# Patient Record
Sex: Male | Born: 1940
Health system: Southern US, Community
[De-identification: ages and names within clinical notes are randomized; demographics above are authoritative.]

## PROBLEM LIST (undated history)

## (undated) DIAGNOSIS — E1165 Type 2 diabetes mellitus with hyperglycemia: Secondary | ICD-10-CM

## (undated) DIAGNOSIS — H269 Unspecified cataract: Secondary | ICD-10-CM

## (undated) DIAGNOSIS — G5601 Carpal tunnel syndrome, right upper limb: Secondary | ICD-10-CM

## (undated) DIAGNOSIS — M0600 Rheumatoid arthritis without rheumatoid factor, unspecified site: Secondary | ICD-10-CM

## (undated) DIAGNOSIS — Z86010 Personal history of colonic polyps: Secondary | ICD-10-CM

## (undated) DIAGNOSIS — E785 Hyperlipidemia, unspecified: Secondary | ICD-10-CM

## (undated) DIAGNOSIS — N289 Disorder of kidney and ureter, unspecified: Secondary | ICD-10-CM

## (undated) DIAGNOSIS — E079 Disorder of thyroid, unspecified: Secondary | ICD-10-CM

## (undated) DIAGNOSIS — N529 Male erectile dysfunction, unspecified: Secondary | ICD-10-CM

## (undated) DIAGNOSIS — IMO0001 Reserved for inherently not codable concepts without codable children: Secondary | ICD-10-CM

## (undated) DIAGNOSIS — E1140 Type 2 diabetes mellitus with diabetic neuropathy, unspecified: Secondary | ICD-10-CM

## (undated) DIAGNOSIS — C439 Malignant melanoma of skin, unspecified: Secondary | ICD-10-CM

## (undated) DIAGNOSIS — K219 Gastro-esophageal reflux disease without esophagitis: Secondary | ICD-10-CM

## (undated) DIAGNOSIS — Z87442 Personal history of urinary calculi: Secondary | ICD-10-CM

## (undated) HISTORY — DX: Gastro-esophageal reflux disease without esophagitis: K21.9

## (undated) HISTORY — DX: Carpal tunnel syndrome, right upper limb: G56.01

## (undated) HISTORY — DX: Reserved for inherently not codable concepts without codable children: IMO0001

## (undated) HISTORY — DX: Male erectile dysfunction, unspecified: N52.9

## (undated) HISTORY — DX: Disorder of thyroid, unspecified: E07.9

## (undated) HISTORY — PX: CHOLECYSTECTOMY: SHX55

## (undated) HISTORY — DX: Hyperlipidemia, unspecified: E78.5

## (undated) HISTORY — PX: MELANOMA EXCISION: SHX5266

## (undated) HISTORY — DX: Type 2 diabetes mellitus with diabetic neuropathy, unspecified: E11.40

## (undated) HISTORY — DX: Type 2 diabetes mellitus with hyperglycemia: E11.65

## (undated) HISTORY — DX: Malignant melanoma of skin, unspecified: C43.9

## (undated) HISTORY — PX: PILONIDAL CYST / SINUS EXCISION: SUR543

## (undated) HISTORY — DX: Unspecified cataract: H26.9

## (undated) HISTORY — DX: Rheumatoid arthritis without rheumatoid factor, unspecified site: M06.00

## (undated) HISTORY — PX: ROTATOR CUFF REPAIR: SHX139

## (undated) HISTORY — PX: TOE SURGERY: SHX1073

## (undated) HISTORY — PX: CATARACT EXTRACTION W/ INTRAOCULAR LENS IMPLANT: SHX1309

## (undated) HISTORY — PX: KNEE ARTHROSCOPY: SUR90

## (undated) HISTORY — DX: Personal history of urinary calculi: Z87.442

## (undated) HISTORY — DX: Personal history of colonic polyps: Z86.010

---

## 1997-08-22 ENCOUNTER — Ambulatory Visit (HOSPITAL_COMMUNITY): Admission: RE | Admit: 1997-08-22 | Discharge: 1997-08-22 | Payer: Self-pay | Admitting: *Deleted

## 2001-04-04 ENCOUNTER — Ambulatory Visit (HOSPITAL_BASED_OUTPATIENT_CLINIC_OR_DEPARTMENT_OTHER): Admission: RE | Admit: 2001-04-04 | Discharge: 2001-04-04 | Payer: Self-pay | Admitting: *Deleted

## 2001-04-18 ENCOUNTER — Ambulatory Visit (HOSPITAL_BASED_OUTPATIENT_CLINIC_OR_DEPARTMENT_OTHER): Admission: RE | Admit: 2001-04-18 | Discharge: 2001-04-18 | Payer: Self-pay | Admitting: *Deleted

## 2003-04-07 ENCOUNTER — Encounter: Admission: RE | Admit: 2003-04-07 | Discharge: 2003-07-06 | Payer: Self-pay | Admitting: Family Medicine

## 2003-07-22 ENCOUNTER — Ambulatory Visit (HOSPITAL_COMMUNITY): Admission: RE | Admit: 2003-07-22 | Discharge: 2003-07-22 | Payer: Self-pay | Admitting: General Surgery

## 2003-07-24 ENCOUNTER — Encounter: Admission: RE | Admit: 2003-07-24 | Discharge: 2003-10-22 | Payer: Self-pay | Admitting: Family Medicine

## 2003-07-31 ENCOUNTER — Encounter (INDEPENDENT_AMBULATORY_CARE_PROVIDER_SITE_OTHER): Payer: Self-pay | Admitting: Specialist

## 2003-07-31 ENCOUNTER — Ambulatory Visit (HOSPITAL_BASED_OUTPATIENT_CLINIC_OR_DEPARTMENT_OTHER): Admission: RE | Admit: 2003-07-31 | Discharge: 2003-07-31 | Payer: Self-pay | Admitting: General Surgery

## 2003-07-31 ENCOUNTER — Ambulatory Visit (HOSPITAL_COMMUNITY): Admission: RE | Admit: 2003-07-31 | Discharge: 2003-07-31 | Payer: Self-pay | Admitting: General Surgery

## 2004-06-24 ENCOUNTER — Encounter: Admission: RE | Admit: 2004-06-24 | Discharge: 2004-06-24 | Payer: Self-pay | Admitting: Family Medicine

## 2006-02-28 ENCOUNTER — Ambulatory Visit: Payer: Self-pay | Admitting: Internal Medicine

## 2006-02-28 LAB — CONVERTED CEMR LAB
Albumin: 3.9 g/dL (ref 3.5–5.2)
Calcium: 10 mg/dL (ref 8.4–10.5)
Creatinine,U: 143.2 mg/dL
GFR calc Af Amer: 96 mL/min
Microalb, Ur: 1.7 mg/dL (ref 0.0–1.9)
Phosphorus: 3.3 mg/dL (ref 2.3–4.6)
Potassium: 4.4 meq/L (ref 3.5–5.1)
TSH: 1.68 microintl units/mL (ref 0.35–5.50)

## 2006-08-22 DIAGNOSIS — E039 Hypothyroidism, unspecified: Secondary | ICD-10-CM | POA: Insufficient documentation

## 2006-08-22 DIAGNOSIS — K2100 Gastro-esophageal reflux disease with esophagitis, without bleeding: Secondary | ICD-10-CM | POA: Insufficient documentation

## 2006-08-22 DIAGNOSIS — F528 Other sexual dysfunction not due to a substance or known physiological condition: Secondary | ICD-10-CM | POA: Insufficient documentation

## 2006-08-22 DIAGNOSIS — E785 Hyperlipidemia, unspecified: Secondary | ICD-10-CM | POA: Insufficient documentation

## 2006-08-22 DIAGNOSIS — Z85820 Personal history of malignant melanoma of skin: Secondary | ICD-10-CM | POA: Insufficient documentation

## 2006-08-26 DIAGNOSIS — E1149 Type 2 diabetes mellitus with other diabetic neurological complication: Secondary | ICD-10-CM | POA: Insufficient documentation

## 2006-08-26 DIAGNOSIS — E1165 Type 2 diabetes mellitus with hyperglycemia: Secondary | ICD-10-CM

## 2006-08-29 ENCOUNTER — Ambulatory Visit: Payer: Self-pay | Admitting: Internal Medicine

## 2006-09-07 ENCOUNTER — Ambulatory Visit: Payer: Self-pay

## 2006-09-07 ENCOUNTER — Encounter: Payer: Self-pay | Admitting: Internal Medicine

## 2006-12-14 ENCOUNTER — Ambulatory Visit: Payer: Self-pay | Admitting: Internal Medicine

## 2006-12-14 LAB — CONVERTED CEMR LAB
Blood in Urine, dipstick: NEGATIVE
Glucose, Urine, Semiquant: >=1000
Protein, U semiquant: 30
WBC Urine, dipstick: NEGATIVE

## 2007-01-24 ENCOUNTER — Emergency Department (HOSPITAL_COMMUNITY): Admission: EM | Admit: 2007-01-24 | Discharge: 2007-01-24 | Payer: Self-pay | Admitting: Emergency Medicine

## 2007-02-28 ENCOUNTER — Ambulatory Visit: Payer: Self-pay | Admitting: Internal Medicine

## 2007-02-28 DIAGNOSIS — Z87442 Personal history of urinary calculi: Secondary | ICD-10-CM | POA: Insufficient documentation

## 2007-03-01 LAB — CONVERTED CEMR LAB
Alkaline Phosphatase: 65 units/L (ref 39–117)
BUN: 13 mg/dL (ref 6–23)
Basophils Relative: 0.1 % (ref 0.0–1.0)
Bilirubin, Direct: 0.1 mg/dL (ref 0.0–0.3)
Calcium: 9.4 mg/dL (ref 8.4–10.5)
Chloride: 108 meq/L (ref 96–112)
Cholesterol: 142 mg/dL (ref 0–200)
Creatinine,U: 219.3 mg/dL
Eosinophils Absolute: 0.3 10*3/uL (ref 0.0–0.6)
Eosinophils Relative: 3.4 % (ref 0.0–5.0)
GFR calc Af Amer: 86 mL/min
GFR calc non Af Amer: 71 mL/min
Glucose, Bld: 166 mg/dL — ABNORMAL HIGH (ref 70–99)
HCT: 44.9 % (ref 39.0–52.0)
HDL: 32.8 mg/dL — ABNORMAL LOW (ref >39.0)
LDL Cholesterol: 87 mg/dL (ref 0–99)
Lymphocytes Relative: 20 % (ref 12.0–46.0)
MCHC: 33.2 g/dL (ref 30.0–36.0)
MCV: 92.2 fL (ref 78.0–100.0)
Microalb Creat Ratio: 7.3 mg/g (ref 0.0–30.0)
Microalb, Ur: 1.6 mg/dL (ref 0.0–1.9)
Monocytes Absolute: 0.5 10*3/uL (ref 0.2–0.7)
Neutro Abs: 5.8 10*3/uL (ref 1.4–7.7)
Neutrophils Relative %: 70.9 % (ref 43.0–77.0)
Potassium: 5 meq/L (ref 3.5–5.1)
Total Bilirubin: 0.9 mg/dL (ref 0.3–1.2)
Total Protein: 6.8 g/dL (ref 6.0–8.3)
WBC: 8.2 10*3/uL (ref 4.5–10.5)

## 2007-09-10 ENCOUNTER — Ambulatory Visit: Payer: Self-pay | Admitting: Internal Medicine

## 2007-09-11 LAB — CONVERTED CEMR LAB
ALT: 12 units/L (ref 0–53)
AST: 16 units/L (ref 0–37)
Albumin: 4.2 g/dL (ref 3.5–5.2)
Alkaline Phosphatase: 57 units/L (ref 39–117)
BUN: 17 mg/dL (ref 6–23)
Bilirubin, Direct: 0.1 mg/dL (ref 0.0–0.3)
Calcium: 9.9 mg/dL (ref 8.4–10.5)
Chloride: 109 meq/L (ref 96–112)
Cholesterol: 137 mg/dL (ref 0–200)
Creatinine, Ser: 1 mg/dL (ref 0.4–1.5)
GFR calc Af Amer: 95 mL/min
LDL Cholesterol: 86 mg/dL (ref 0–99)
PSA: 0.76 ng/mL (ref 0.10–4.00)
Phosphorus: 3.7 mg/dL (ref 2.3–4.6)
Total Protein: 7.1 g/dL (ref 6.0–8.3)
Triglycerides: 108 mg/dL (ref 0–149)

## 2007-10-25 ENCOUNTER — Encounter: Payer: Self-pay | Admitting: Internal Medicine

## 2008-01-17 ENCOUNTER — Telehealth: Payer: Self-pay | Admitting: Internal Medicine

## 2008-03-05 ENCOUNTER — Ambulatory Visit: Payer: Self-pay | Admitting: Internal Medicine

## 2008-03-11 ENCOUNTER — Ambulatory Visit: Payer: Self-pay | Admitting: Internal Medicine

## 2008-03-11 LAB — CONVERTED CEMR LAB: Fecal Occult Bld: NEGATIVE

## 2008-04-28 ENCOUNTER — Encounter: Payer: Self-pay | Admitting: Internal Medicine

## 2008-08-20 ENCOUNTER — Encounter: Payer: Self-pay | Admitting: Internal Medicine

## 2008-08-26 ENCOUNTER — Emergency Department (HOSPITAL_COMMUNITY): Admission: EM | Admit: 2008-08-26 | Discharge: 2008-08-26 | Payer: Self-pay | Admitting: Emergency Medicine

## 2008-08-26 ENCOUNTER — Ambulatory Visit: Payer: Self-pay | Admitting: Family Medicine

## 2008-09-03 ENCOUNTER — Ambulatory Visit: Payer: Self-pay | Admitting: Internal Medicine

## 2008-09-04 LAB — CONVERTED CEMR LAB
AST: 17 units/L (ref 0–37)
Alkaline Phosphatase: 68 units/L (ref 39–117)
BUN: 14 mg/dL (ref 6–23)
Basophils Relative: 0.1 % (ref 0.0–3.0)
Bilirubin, Direct: 0 mg/dL (ref 0.0–0.3)
CO2: 30 meq/L (ref 19–32)
Chloride: 107 meq/L (ref 96–112)
Cholesterol: 145 mg/dL (ref 0–200)
Creatinine, Ser: 1 mg/dL (ref 0.4–1.5)
Eosinophils Relative: 4 % (ref 0.0–5.0)
Free T4: 0.9 ng/dL (ref 0.6–1.6)
HDL: 33.8 mg/dL — ABNORMAL LOW (ref >39.00)
Hgb A1c MFr Bld: 8.8 % — ABNORMAL HIGH (ref 4.6–6.5)
Lymphs Abs: 1.4 10*3/uL (ref 0.7–4.0)
MCV: 91.9 fL (ref 78.0–100.0)
Monocytes Absolute: 0.4 10*3/uL (ref 0.1–1.0)
Monocytes Relative: 5.2 % (ref 3.0–12.0)
Neutro Abs: 5.3 10*3/uL (ref 1.4–7.7)
Platelets: 191 10*3/uL (ref 150.0–400.0)
TSH: 4.07 microintl units/mL (ref 0.35–5.50)
Total CHOL/HDL Ratio: 4
Total Protein: 7 g/dL (ref 6.0–8.3)
Triglycerides: 77 mg/dL (ref 0.0–149.0)

## 2008-10-16 ENCOUNTER — Telehealth: Payer: Self-pay | Admitting: Internal Medicine

## 2008-12-03 ENCOUNTER — Telehealth: Payer: Self-pay | Admitting: Internal Medicine

## 2008-12-08 ENCOUNTER — Ambulatory Visit: Payer: Self-pay | Admitting: Internal Medicine

## 2008-12-08 DIAGNOSIS — R0609 Other forms of dyspnea: Secondary | ICD-10-CM | POA: Insufficient documentation

## 2008-12-08 DIAGNOSIS — R0989 Other specified symptoms and signs involving the circulatory and respiratory systems: Secondary | ICD-10-CM

## 2008-12-19 ENCOUNTER — Telehealth: Payer: Self-pay | Admitting: Family Medicine

## 2009-02-02 ENCOUNTER — Ambulatory Visit: Payer: Self-pay | Admitting: Internal Medicine

## 2009-02-02 DIAGNOSIS — R079 Chest pain, unspecified: Secondary | ICD-10-CM | POA: Insufficient documentation

## 2009-02-06 ENCOUNTER — Encounter: Payer: Self-pay | Admitting: Internal Medicine

## 2009-02-06 ENCOUNTER — Ambulatory Visit: Payer: Self-pay | Admitting: Internal Medicine

## 2009-02-06 ENCOUNTER — Emergency Department (HOSPITAL_COMMUNITY): Admission: EM | Admit: 2009-02-06 | Discharge: 2009-02-07 | Payer: Self-pay | Admitting: Emergency Medicine

## 2009-02-09 ENCOUNTER — Telehealth: Payer: Self-pay | Admitting: Internal Medicine

## 2009-02-10 ENCOUNTER — Telehealth: Payer: Self-pay | Admitting: Internal Medicine

## 2009-02-12 ENCOUNTER — Encounter: Payer: Self-pay | Admitting: Internal Medicine

## 2009-03-11 ENCOUNTER — Encounter: Payer: Self-pay | Admitting: Internal Medicine

## 2009-03-27 ENCOUNTER — Ambulatory Visit: Payer: Self-pay | Admitting: Internal Medicine

## 2009-03-30 LAB — CONVERTED CEMR LAB: Hgb A1c MFr Bld: 8.5 % — ABNORMAL HIGH (ref 4.6–6.1)

## 2009-04-03 ENCOUNTER — Ambulatory Visit: Payer: Self-pay | Admitting: Internal Medicine

## 2009-04-06 ENCOUNTER — Encounter: Payer: Self-pay | Admitting: Internal Medicine

## 2009-04-21 ENCOUNTER — Encounter: Payer: Self-pay | Admitting: Internal Medicine

## 2009-08-12 ENCOUNTER — Ambulatory Visit: Payer: Self-pay | Admitting: Family Medicine

## 2009-08-21 ENCOUNTER — Telehealth: Payer: Self-pay | Admitting: Internal Medicine

## 2009-09-09 ENCOUNTER — Ambulatory Visit: Payer: Self-pay | Admitting: Internal Medicine

## 2009-09-09 LAB — CONVERTED CEMR LAB
AST: 17 units/L (ref 0–37)
Albumin: 4.2 g/dL (ref 3.5–5.2)
Alkaline Phosphatase: 66 units/L (ref 39–117)
Bilirubin, Direct: 0.2 mg/dL (ref 0.0–0.3)
Calcium: 9.5 mg/dL (ref 8.4–10.5)
Creatinine, Ser: 1 mg/dL (ref 0.4–1.5)
Creatinine,U: 251.8 mg/dL
Eosinophils Relative: 4.5 % (ref 0.0–5.0)
Glucose, Bld: 139 mg/dL — ABNORMAL HIGH (ref 70–99)
HCT: 43.9 % (ref 39.0–52.0)
Lymphs Abs: 1.7 10*3/uL (ref 0.7–4.0)
MCHC: 34.2 g/dL (ref 30.0–36.0)
MCV: 93.7 fL (ref 78.0–100.0)
Monocytes Relative: 5.7 % (ref 3.0–12.0)
Neutro Abs: 6.5 10*3/uL (ref 1.4–7.7)
Phosphorus: 4 mg/dL (ref 2.3–4.6)
RDW: 13.5 % (ref 11.5–14.6)
Total Bilirubin: 0.9 mg/dL (ref 0.3–1.2)

## 2009-09-15 ENCOUNTER — Ambulatory Visit: Payer: Self-pay | Admitting: Internal Medicine

## 2010-01-25 ENCOUNTER — Telehealth: Payer: Self-pay | Admitting: Internal Medicine

## 2010-02-01 ENCOUNTER — Telehealth (INDEPENDENT_AMBULATORY_CARE_PROVIDER_SITE_OTHER): Payer: Self-pay | Admitting: *Deleted

## 2010-02-07 LAB — CONVERTED CEMR LAB
ALT: 146 units/L — ABNORMAL HIGH (ref 0–53)
AST: 103 units/L — ABNORMAL HIGH (ref 0–37)
Albumin: 3.9 g/dL (ref 3.5–5.2)
Albumin: 4.1 g/dL (ref 3.5–5.2)
Alkaline Phosphatase: 166 units/L — ABNORMAL HIGH (ref 39–117)
Alkaline Phosphatase: 60 units/L (ref 39–117)
Basophils Absolute: 0.1 10*3/uL (ref 0.0–0.1)
Basophils Relative: 1.6 % (ref 0.0–3.0)
Bilirubin, Direct: 0.1 mg/dL (ref 0.0–0.3)
CO2: 29 meq/L (ref 19–32)
Calcium: 9.3 mg/dL (ref 8.4–10.5)
Calcium: 9.8 mg/dL (ref 8.4–10.5)
Chloride: 104 meq/L (ref 96–112)
Chloride: 106 meq/L (ref 96–112)
Chloride: 110 meq/L (ref 96–112)
Creatinine, Ser: 0.9 mg/dL (ref 0.4–1.5)
Creatinine, Ser: 1.1 mg/dL (ref 0.4–1.5)
Eosinophils Absolute: 0.2 10*3/uL (ref 0.0–0.7)
Free T4: 0.9 ng/dL (ref 0.6–1.6)
GFR calc Af Amer: 108 mL/min
GFR calc Af Amer: 85 mL/min
GFR calc non Af Amer: 71 mL/min
GFR calc non Af Amer: 89 mL/min
Glucose, Bld: 136 mg/dL — ABNORMAL HIGH (ref 70–99)
HCT: 44 % (ref 39.0–52.0)
HDL: 31.5 mg/dL — ABNORMAL LOW (ref >39.0)
Hemoglobin: 14.6 g/dL (ref 13.0–17.0)
Lymphocytes Relative: 12.5 % (ref 12.0–46.0)
Lymphs Abs: 0.8 10*3/uL (ref 0.7–4.0)
MCV: 92.3 fL (ref 78.0–100.0)
Microalb Creat Ratio: 24.7 mg/g (ref 0.0–30.0)
Monocytes Absolute: 0.4 10*3/uL (ref 0.1–1.0)
Monocytes Relative: 5 % (ref 3.0–12.0)
Monocytes Relative: 5.3 % (ref 3.0–12.0)
Neutro Abs: 4.7 10*3/uL (ref 1.4–7.7)
Neutrophils Relative %: 75 % (ref 43.0–77.0)
PSA: 1.28 ng/mL (ref 0.10–4.00)
Phosphorus: 4.2 mg/dL (ref 2.3–4.6)
Platelets: 197 10*3/uL (ref 150.0–400.0)
Platelets: 210 10*3/uL (ref 150–400)
Potassium: 4.5 meq/L (ref 3.5–5.1)
Potassium: 4.8 meq/L (ref 3.5–5.1)
RBC: 4.83 M/uL (ref 4.22–5.81)
RDW: 12.3 % (ref 11.5–14.6)
RDW: 12.7 % (ref 11.5–14.6)
Sodium: 140 meq/L (ref 135–145)
Sodium: 146 meq/L — ABNORMAL HIGH (ref 135–145)
TSH: 0.19 microintl units/mL — ABNORMAL LOW (ref 0.35–5.50)
Total Bilirubin: 1.2 mg/dL (ref 0.3–1.2)
Total CHOL/HDL Ratio: 5.1
Total Protein: 6.8 g/dL (ref 6.0–8.3)
Total Protein: 7.2 g/dL (ref 6.0–8.3)
Triglycerides: 94 mg/dL (ref 0–149)
VLDL: 19 mg/dL (ref 0–40)
WBC: 6.2 10*3/uL (ref 4.5–10.5)
WBC: 7.8 10*3/uL (ref 4.5–10.5)

## 2010-02-09 NOTE — Letter (Signed)
Summary: Results Follow up Letter  [place] [facility]  [address] [place], [place] [address]   Phone: [phone]  Fax: [phone]    04/06/2009 MRN: [id number]  [facility] [address] [place], [place]  [address]  Dear Mr. Morel,  The following are the results of your recent test(s):  Test         Result    Pap Smear:        Normal _____  Not Normal _____ Comments: ______________________________________________________ Cholesterol: LDL(Bad cholesterol):         Your goal is less than:         HDL (Good cholesterol):       Your goal is more than: Comments:  ______________________________________________________ Mammogram:        Normal _____  Not Normal _____ Comments:  ___________________________________________________________________ Hemoccult:        Normal __X___  Not normal _______ Comments:  stool test is negative for blood, we will repeat this next year.  _____________________________________________________________________ Other Tests:    We routinely do not discuss normal results over the telephone.  If you desire a copy of the results, or you have any questions about this information we can discuss them at your next office visit.   Sincerely,

## 2010-02-09 NOTE — Letter (Signed)
Summary: [facility]  [facility]   Imported By: Lanelle Bal 05/21/2009 13:23:01  _____________________________________________________________________  External Attachment:    Type:   Image     Comment:   External Document  Appended Document: [facility]    Clinical Lists Changes  Observations: Added new observation of DIAB EYE EX: No diabetic retinopathy.    (04/21/2009 7:33)       Diabetic Eye Exam  Procedure date:  04/21/2009  Findings:      No diabetic retinopathy.

## 2010-02-09 NOTE — Assessment & Plan Note (Signed)
Summary: FOLLOW UP / LFW   Vital Signs:  Patient profile:   70 year old male Weight:      182 pounds Temp:     99.0 degrees F oral Pulse rate:   76 / minute Pulse rhythm:   regular BP sitting:   110 / 68  (left arm) Cuff size:   large  Vitals Entered By: Mervin Hack CMA Duncan Dull) (September 15, 2009 9:32 AM) CC: 6 month follow-up   History of Present Illness: Doing well No chest pain since the last visit seemed to be tingliy and "inside" along axillary border  Checks sugars every other week or so Usually  ~130 in the morning was 230 once in the afternoon --he felt nervous NO hypoglycemic reactions No foot pain or lesions  No chest pain No SOB No edema No sig exercise---does yard work and occ rides bike  stomach is quiet  Allergies: 1)  Lipitor (Atorvastatin Calcium)  Past History:  Past medical, surgical, family and social histories (including risk factors) reviewed for relevance to current acute and chronic problems.  Past Medical History: Reviewed history from 02/28/2007 and no changes required. NIDDM with nephropathy Hyperlipidemia Hypothyroidism Melanoma GERD/reflux esophagiits Erectile dysfunction Nephrolithiasis, hx of---passed 1/09  Past Surgical History: Reviewed history from 03/27/2009 and no changes required. Pilonidal cyst 1960 Left knee arthroscopy 2000 Melanoma- right arm 2006 Left rotator cuff repair Left foot (toe) surgery Right carpal tunnel 03/03 Exercise treadmill, negative 09/92 Cholecystectomy  2/11  Family History: Reviewed history from 08/22/2006 and no changes required. Father: Died at 48, leukemia Mother: Died at age 77-- MI, DM Siblings: 2 Brothers, one deceased from drowning (boat accident), one died in his 87's, MI.  One sister died in her 14's ?, bad arthritis CAD in  Mom, brother DM in  Mom No prostate or colon cancer  Social History: Reviewed history from 08/22/2006 and no changes required. 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   Neck:  supple, no masses, no thyromegaly, no carotid bruits, and no cervical lymphadenopathy.   Lungs:  normal respiratory effort, no intercostal retractions, no accessory muscle use, and normal breath sounds.   Heart:  normal rate, regular rhythm, no murmur, and no gallop.   Abdomen:  soft and non-tender.   Msk:  no joint tenderness and no joint swelling.   Pulses:  1+ in feet Extremities:  no edema Skin:  no rashes and no ulcerations.   Psych:  normally interactive, good eye contact, not anxious appearing, and not depressed appearing.    Diabetes Management Exam:    Foot Exam (with socks and/or shoes not present):       Sensory-Pinprick/Light touch:          Left medial foot (L-4): normal          Left dorsal foot (L-5): normal          Left lateral foot (S-1): normal          Right medial foot (L-4): normal          Right dorsal foot (L-5): normal          Right lateral foot (S-1): normal       Inspection:          Left  foot: normal          Right foot: normal       Nails:          Left foot: normal          Right foot: normal   Impression & Recommendations:  Problem # 1:  DIABETES MELLITUS, TYPE II, CONTROLLED, W/RENAL COMPS (ICD-250.40) Assessment Improved has been more careful with sweets and his diet regularly no changes needed now  His updated medication list for this problem includes:    Losartan Potassium 100 Mg Tabs (Losartan potassium) .Marland Kitchen... Take 1 by mouth once daily    Metformin Hcl 500 Mg Tabs (Metformin hcl) .Marland Kitchen... Take 2 by mouth two times a day    Glipizide 5 Mg Tabs (Glipizide) .Marland Kitchen... Take one by mouth two times a day  Labs Reviewed: Creat: 1.0 (09/09/2009)     Last Eye Exam: No diabetic  retinopathy.    (04/21/2009) Reviewed HgBA1c results: 7.6 (09/09/2009)  8.5 (03/27/2009)  Problem # 2:  HYPERLIPIDEMIA (ICD-272.4) Assessment: Unchanged  good control  His updated medication list for this problem includes:    Simvastatin 80 Mg Tabs (Simvastatin) .Marland Kitchen... Take one by mouth once a day  Labs Reviewed: SGOT: 17 (09/09/2009)   SGPT: 11 (09/09/2009)   HDL:34.70 (09/09/2009), 33.80 (09/03/2008)  LDL:96 (09/09/2009), 96 (09/03/2008)  Chol:159 (09/09/2009), 145 (09/03/2008)  Trig:144.0 (09/09/2009), 77.0 (09/03/2008)  Problem # 3:  REFLUX ESOPHAGITIS (ICD-530.11) Assessment: Unchanged doing fine on this  Complete Medication List: 1)  Losartan Potassium 100 Mg Tabs (Losartan potassium) .... Take 1 by mouth once daily 2)  Metformin Hcl 500 Mg Tabs (Metformin hcl) .... Take 2 by mouth two times a day 3)  Glipizide 5 Mg Tabs (Glipizide) .... Take one by mouth two times a day 4)  Simvastatin 80 Mg Tabs (Simvastatin) .... Take one by mouth once a day 5)  Synthroid 75 Mcg Tabs (Levothyroxine sodium) .... Take one by mouth once a day 6)  Nexium 40 Mg Cpdr (Esomeprazole magnesium) .Marland Kitchen.. 1 daily on empty stomach 7)  Onetouch Ultra Test Strp (Glucose blood) .... Use as directed dx: 250.40 patient tests twice a week. 8)  Onetouch Lancets Misc (Lancets) .... Dx: 250.40 use as directed, patient tests twice weekly  Patient Instructions: 1)  Please schedule a follow-up appointment in 6 months .   Current Allergies (reviewed today): LIPITOR (ATORVASTATIN CALCIUM)

## 2010-02-09 NOTE — Consult Note (Signed)
Summary: [facility]   Imported By: Lanelle Bal 03/02/2009 07:49:19  _____________________________________________________________________  External Attachment:    Type:   Image     Comment:   External Document  Appended Document: [facility] planning lap chole

## 2010-02-09 NOTE — Letter (Signed)
Summary: [facility]   Imported By: Maryln Gottron 03/31/2009 13:17:59  _____________________________________________________________________  External Attachment:    Type:   Image     Comment:   External Document  Appended Document: [facility] doing well after lap chole no further testing for filling defect on intraoperative cholangiogram since no ongoing problems and labs normal

## 2010-02-09 NOTE — Progress Notes (Signed)
Summary: Surgery  Phone Note Call from Patient Call back at Home Phone [phone]   Caller: Patient Call For: Cindee Salt MD Summary of Call: pt would like a call from you to see if he can postpone gallbladder surgery,  pt is scheduled to have surgery in the morning, please advise.   **** patient just called at 4:39 Initial call taken by: DeShannon Katrinka Blazing CMA Duncan Dull),  February 10, 2009 4:45 PM  Follow-up for Phone Call        discussed with patient Surgery is tomorrow  Recommended he proceed Elective surgery preferable to recurrence of biliary colic Follow-up by: Cindee Salt MD,  February 11, 2009 8:46 AM

## 2010-02-09 NOTE — Assessment & Plan Note (Signed)
Summary: CHEST PAIN/CLE   Vital Signs:  Patient profile:   70 year old male Weight:      184 pounds Temp:     99.1 degrees F oral Pulse rate:   72 / minute Pulse rhythm:   regular BP sitting:   112 / 60  (left arm) Cuff size:   large  Vitals Entered By: Mervin Hack CMA Duncan Dull) (February 02, 2009 4:48 PM) CC: abdominal pain   History of Present Illness: Has been having more problems with acid reflux Started yesterday at [facility] and steadily worsened over 1.5 hours The past week had episode it lasted 2 hours feels bloated and hard in stomach  tried baking soda and vinegar yesterday---not clear if it helped Both episodes occurred at rest No SOB Does have a burning sensation Felt cold last week with episode Has had some nausea yesterday  Relatively sedentary Walks uphill from work shed--gets him SOB but no real change  No edema  No swallowing problems  Allergies: 1)  Lipitor (Atorvastatin Calcium)  Past History:  Past medical, surgical, family and social histories (including risk factors) reviewed for relevance to current acute and chronic problems.  Past Medical History: Reviewed history from 02/28/2007 and no changes required. NIDDM with nephropathy Hyperlipidemia Hypothyroidism Melanoma GERD/reflux esophagiits Erectile dysfunction Nephrolithiasis, hx of---passed 1/09  Past Surgical History: Reviewed history from 08/22/2006 and no changes required. Pilonidal cyst 1960 Left knee arthroscopy 2000 Melanoma- right arm 2006 Left rotator cuff repair Left foot (toe) surgery Right carpal tunnel 03/03 Exercise treadmill, negative 09/92  Family History: Reviewed history from 08/22/2006 and no changes required. Father: Died at 90, leukemia Mother: Died at age 10-- MI, DM Siblings: 2 Brothers, one deceased from drowning (boat accident), one died in his 58's, MI.  One sister died in her 48's ?, bad arthritis CAD in  Mom, brother DM in  Mom No prostate or colon  cancer  Social History: Reviewed history from 08/22/2006 and no changes required. 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   Neck:  supple, no masses, no thyromegaly, no carotid bruits, and no cervical lymphadenopathy.   Lungs:  normal respiratory effort and normal breath sounds.   Heart:  normal rate, regular rhythm, no murmur, and no gallop.   Abdomen:  soft, non-tender, normal bowel sounds, and no masses.  Murphy's sign absent No HSM Extremities:  no edema Psych:  normally interactive, good eye contact, not anxious appearing, and not depressed appearing.     Impression & Recommendations:  Problem # 1:  CHEST PAIN (ICD-786.50) Assessment New doesn't really sound like coronary ischemia EKG reassuring  could be reflux even other than these events, he has ongoing symptoms has failed OTC prevacid did well on nexium--will give 10 days samples and Rx  sounds like it could be biliary colic white stools and dark urine are suggestive will check labs and RUQ ultrasound  Orders: TLB-Renal Function Panel (80069-RENAL) TLB-CBC Platelet - w/Differential (85025-CBCD) TLB-Hepatic/Liver Function Pnl (80076-HEPATIC) TLB-TSH (Thyroid Stimulating Hormone) (84443-TSH) Venipuncture (16109) EKG w/ Interpretation (93000) Radiology Referral (Radiology)  Complete Medication List:  1)  Metformin Hcl 500 Mg Tabs (Metformin hcl) .... Take 2 by mouth two times a day 2)  Glipizide 5 Mg Tabs (Glipizide) .... Take one by mouth two times a day 3)  Cozaar 100 Mg Tabs (Losartan potassium) ....  Take one by mouth once a day 4)  Simvastatin 80 Mg Tabs (Simvastatin) .... Take one by mouth once a day 5)  Synthroid 75 Mcg Tabs (Levothyroxine sodium) .... Take one by mouth once a day 6)  Onetouch Ultra Test Strp (Glucose blood) .... Use as directed dx: 250.40 patient tests twice a week. 7)  Onetouch Lancets Misc (Lancets) .... Dx: 250.40 use as directed, patient tests twice weekly 8)  Nexium 40 Mg Cpdr (Esomeprazole magnesium) .Marland Kitchen.. 1 daily on empty stomach  Other Orders: TLB-T4 (Thyrox), Free [phone]) TLB-A1C / Hgb A1C (Glycohemoglobin) (83036-A1C)  Patient Instructions: 1)  Keep regular appointment 2)  Set up abdominal ultrasound Prescriptions: NEXIUM 40 MG CPDR (ESOMEPRAZOLE MAGNESIUM) 1 daily on empty stomach  #30 x 11   Entered and Authorized by:   Cindee Salt MD   Signed by:   Cindee Salt MD on 02/02/2009   Method used:   Electronically to        [facility]* (retail)       [address]       [place], [place]  [address]       Ph: [phone]       Fax: [phone]   RxID:   [id number]   Current Allergies (reviewed today): LIPITOR (ATORVASTATIN CALCIUM)   EKG  Procedure date:  02/02/2009  Findings:      sinus arrhythmia @75  Normal variant

## 2010-02-09 NOTE — Progress Notes (Signed)
Summary: Surgeon referral  Phone Note Call from Patient Call back at Home Phone [phone]   Caller: Patient Call For: Cindee Salt MD Summary of Call: Patient states that he was seen in the ER on Friday night.  His gallbladder needs to be removed, and he wants Dr. Alphonsus Sias to recommend a surgeon.  Please advise. Initial call taken by: Linde Gillis CMA Duncan Dull),  February 09, 2009 2:00 PM  Follow-up for Phone Call        I spoke to him already Has appt with [facility] who I would have recommended Follow-up by: Cindee Salt MD,  February 09, 2009 2:24 PM

## 2010-02-09 NOTE — Letter (Signed)
Summary: [facility]: Immunoassay Fecal Occult Blood (iFOB) Order Form  [place] at [facility]  [address] [place], [place] [address]   Phone: [phone]  Fax: [phone]      [facility]: Immunoassay Fecal Occult Blood (iFOB) Order Form   March 27, 2009 MRN: [id number]   Sean Koch Oct 02, 1940   Physicican Name:_______[place]__________________  Diagnosis Code:________V76.51__________________      Cindee Salt MD

## 2010-02-09 NOTE — Assessment & Plan Note (Signed)
Summary: CHEST PAIN   Vital Signs:  Patient profile:   70 year old male Height:      68 inches Weight:      180.6 pounds BMI:     27.56 Temp:     98.9 degrees F oral Pulse rate:   76 / minute Pulse rhythm:   regular BP sitting:   130 / 80  (left arm) Cuff size:   regular  Vitals Entered By: Benny Lennert CMA Duncan Dull) (August 12, 2009 2:17 PM)  History of Present Illness: Chief complaint chest pain ? muscle  Pain in left  lateralchest..describes as a tingling/ mild soreness Started 3 days ago...improved today..only feel in certain positions.  No pain with deep breaths or exertion. Pain only with moving arm, leaning forward.  Tingling when lying on left side with arm above head. No upper back pain.  No fever.  No rash. No hypersensitivity of skin.  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 No falls.  Brother with MI early age, personal history of DM, poor control high chol, HTN,age, male  Problems Prior to Update: 1)  Chest Pain  (ICD-786.50) 2)  Dyspnea/shortness of Breath  (ICD-786.09) 3)  Preventive Health Care  (ICD-V70.0) 4)  Nephrolithiasis, Hx of  (ICD-V13.01) 5)  Screening For Malignant Neoplasm, Prostate  (ICD-V76.44) 6)  Diabetes Mellitus, Type II, Controlled, W/renal Comps  (ICD-250.40) 7)  Reflux Esophagitis  (ICD-530.11) 8)  Erectile Dysfunction  (ICD-302.72) 9)  Melanoma  (ICD-172.9) 10)  Hypothyroidism  (ICD-244.9) 11)  Hyperlipidemia  (ICD-272.4)  Current Medications (verified): 1)  Metformin Hcl 500 Mg  Tabs (Metformin Hcl) .... Take 2 By Mouth Two Times A Day 2)  Glipizide 5 Mg  Tabs (Glipizide) .... Take One By Mouth Two Times A Day 3)  Simvastatin 80 Mg  Tabs (Simvastatin) .... Take One By Mouth Once A Day 4)  Synthroid 75 Mcg  Tabs (Levothyroxine Sodium) .... Take One By Mouth Once A Day 5)  Onetouch Ultra Test  Strp (Glucose Blood) .... Use As Directed Dx: 250.40 Patient Tests Twice A Week. 6)  Onetouch Lancets  Misc (Lancets)  .... Dx: 250.40 Use As Directed, Patient Tests Twice Weekly 7)  Nexium 40 Mg Cpdr (Esomeprazole Magnesium) .Marland Kitchen.. 1 Daily On Empty Stomach 8)  Losartan Potassium 100 Mg Tabs (Losartan Potassium) .... Take 1 By Mouth Once Daily  Allergies: 1)  Lipitor (Atorvastatin Calcium)  Past History:  Past medical, surgical, family and social histories (including risk factors) reviewed, and no changes noted (except as noted below).  Past Medical History: Reviewed history from 02/28/2007 and no changes required. NIDDM with nephropathy Hyperlipidemia Hypothyroidism Melanoma GERD/reflux esophagiits Erectile dysfunction Nephrolithiasis, hx of---passed 1/09  Past Surgical History: Reviewed history from 03/27/2009 and no changes required. Pilonidal cyst 1960 Left knee arthroscopy 2000 Melanoma- right arm 2006 Left rotator cuff repair Left foot (toe) surgery Right carpal tunnel 03/03 Exercise treadmill, negative 09/92 Cholecystectomy  2/11  Family History: Reviewed history from 08/22/2006 and no changes required. Father: Died at 46, leukemia Mother: Died at age 73-- MI, DM Siblings: 2 Brothers, one deceased from drowning (boat accident), one died in his 59's, MI.  One sister died in her 11's ?, bad arthritis CAD in  Mom, brother DM in  Mom No prostate or colon cancer  Social History: Reviewed history from 08/22/2006 and no changes required. 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 CV:  Denies fainting. Resp:  Denies cough, shortness of breath, sputum productive, and wheezing. GI:  Denies abdominal pain, constipation, and diarrhea. GU:  Denies dysuria.  Physical Exam  General:  Well-developed,well-nourished,in no acute distress; alert,appropriate and cooperative throughout examination Nose:  External nasal examination shows no deformity or  inflammation. Nasal mucosa are pink and moist without lesions or exudates. Mouth:  Oral mucosa and oropharynx without lesions or exudates.  Teeth in good repair. Neck:  no carotid bruit or thyromegaly no cervical or supraclavicular lymphadenopathy  Chest Wall:  ttp over left lateral chest wall, increases with stretching of lats Breasts:  No masses or gynecomastia noted Lungs:  Normal respiratory effort, chest expands symmetrically. Lungs are clear to auscultation, no crackles or wheezes. Heart:  Normal rate and regular rhythm. S1 and S2 normal without gallop, murmur, click, rub or other extra sounds. Abdomen:  Bowel sounds positive,abdomen soft and non-tender without masses, organomegaly or hernias noted. Pulses:  R and L posterior tibial pulses are full and equal bilaterally  Extremities:  no edema    Impression & Recommendations:  Problem # 1:  CHEST PAIN (ICD-786.50) Atypical. No change in EKG compared to 01/2009.  He is high risk for CAD given poor Dm control, family history of high cholesterol...so if pain not continuing to improve or any exertional components...will consider stress test.  Pain and exam most consistent with MSK strain of chest wall...treat wiht heat, chest wall stretching.  Keep 1 week follow up with Dr. Alphonsus Sias  for reeval Orders: EKG w/ Interpretation (93000)  Complete Medication List: 1)  Metformin Hcl 500 Mg Tabs (Metformin hcl) .... Take 2 by mouth two times a day 2)  Glipizide 5 Mg Tabs (Glipizide) .... Take one by mouth two times a day 3)  Simvastatin 80 Mg Tabs (Simvastatin) .... Take one by mouth once a day 4)  Synthroid 75 Mcg Tabs (Levothyroxine sodium) .... Take one by mouth once a day 5)  Onetouch Ultra Test Strp (Glucose blood) .... Use as directed dx: 250.40 patient tests twice a week. 6)  Onetouch Lancets Misc (Lancets) .... Dx: 250.40 use as directed, patient tests twice weekly 7)  Nexium 40 Mg Cpdr (Esomeprazole magnesium) .Marland Kitchen.. 1 daily on empty  stomach 8)  Losartan Potassium 100 Mg Tabs (Losartan potassium) .... Take 1 by mouth once daily  Patient Instructions: 1)  Heat on left lateral chest wall. 2)   Gentle chest wall stretching. 3)   Call if not improviing or if worsening, or any shortness or breath, fatigue, sweating. 4)  Keep upcoming follow up appt with Dr. Alphonsus Sias.   Current Allergies (reviewed today): LIPITOR (ATORVASTATIN CALCIUM)

## 2010-02-09 NOTE — Progress Notes (Signed)
Summary: pt wants labs prior to office visit  Phone Note Call from Patient Call back at Home Phone [phone]   Caller: Patient Call For: Cindee Salt MD Reason for Call: Talk to Doctor Summary of Call: Pt has appt to see you on 9/06 and he would like to have lab work prior.  OK to schedule? Initial call taken by: Lowella Petties CMA,  August 21, 2009 11:43 AM  Follow-up for Phone Call        Okay to schedule before PE HgbA1c, urine microal, renal, TSH, CBC with diff--250.40 lipid, hepatic  272.4  Let him know I am not sure he needs PSA for prostate screening  If he really wants it, add it on (V76.44). Otherwise, we will discuss it at his visit Follow-up by: Cindee Salt MD,  August 21, 2009 1:08 PM  Additional Follow-up for Phone Call Additional follow up Details #1::        Lab appt scheduled, pt wants to hold off on PSA for now. Additional Follow-up by: Lowella Petties CMA,  August 21, 2009 2:47 PM

## 2010-02-09 NOTE — Assessment & Plan Note (Signed)
Summary: F/U / LFW   Vital Signs:  Patient profile:   70 year old male Weight:      180 pounds Temp:     98.7 degrees F oral Pulse rate:   76 / minute Pulse rhythm:   regular BP sitting:   122 / 60  (left arm) Cuff size:   large  Vitals Entered By: Mervin Hack CMA Duncan Dull) (March 27, 2009 2:04 PM) CC: 6 month follow-up   History of Present Illness: Feels really great had gallbladder removed switched from prevacid to nexium cut out all his reflux  Has improved eating plans to start exercising more doesn't want new DM meds so he is determined to do it with lifestyle checks sugars sporadically High is 200 Fasting sugars 120-140 No hypoglycemic spells  NO problems with simvastatin no myalgias  Allergies: 1)  Lipitor (Atorvastatin Calcium)  Past History:  Past medical, surgical, family and social histories (including risk factors) reviewed for relevance to current acute and chronic problems.  Past Medical History: Reviewed history from 02/28/2007 and no changes required. NIDDM with nephropathy Hyperlipidemia Hypothyroidism Melanoma GERD/reflux esophagiits Erectile dysfunction Nephrolithiasis, hx of---passed 1/09  Past Surgical History: Pilonidal cyst 1960 Left knee arthroscopy 2000 Melanoma- right arm 2006 Left rotator cuff repair Left foot (toe) surgery Right carpal tunnel 03/03 Exercise treadmill, negative 09/92 Cholecystectomy  2/11  Family History: Reviewed history from 08/22/2006 and no changes required. Father: Died at 60, leukemia Mother: Died at age 8-- MI, DM Siblings: 2 Brothers, one deceased from drowning (boat accident), one died in his 52's, MI.  One sister died in her 12's ?, bad arthritis CAD in  Mom, brother DM in  Mom No prostate or colon cancer  Social History: Reviewed history from 08/22/2006 and no changes required. Marital Status: Married Children: 2 sons Occupation: Retired, Risk manager, Emergency planning/management officer Never  Smoked Alcohol use-occ  Review of Systems       sleeping generally okay No regular nocturia weight down 4#  Physical Exam  General:  alert and normal appearance.   Neck:  supple, no masses, no thyromegaly, no carotid bruits, and no cervical lymphadenopathy.   Lungs:  normal respiratory effort and normal breath sounds.   Heart:  normal rate, regular rhythm, no murmur, and no gallop.   Abdomen:  soft and non-tender.   Pulses:  1+ Extremities:  no edema Skin:  no suspicious lesions and no ulcerations.   Psych:  normally interactive, good eye contact, not anxious appearing, and not depressed appearing.    Diabetes Management Exam:    Foot Exam (with socks and/or shoes not present):       Sensory-Pinprick/Light touch:          Left medial foot (L-4): normal          Left dorsal foot (L-5): normal          Left lateral foot (S-1): normal          Right medial foot (L-4): normal          Right dorsal foot (L-5): normal          Right lateral foot (S-1): normal       Inspection:          Left foot: normal          Right foot: normal       Nails:          Left foot: normal          Right  foot: normal   Impression & Recommendations:  Problem # 1:  DIABETES MELLITUS, TYPE II, CONTROLLED, W/RENAL COMPS (ICD-250.40) Assessment Comment Only  hopefully improved control plans more vigorous exercise will check A1c  The following medications were removed from the medication list:    Cozaar 100 Mg Tabs (Losartan potassium) .Marland Kitchen... Take one by mouth once a day His updated medication list for this problem includes:    Metformin Hcl 500 Mg Tabs (Metformin hcl) .Marland Kitchen... Take 2 by mouth two times a day    Glipizide 5 Mg Tabs (Glipizide) .Marland Kitchen... Take one by mouth two times a day    Losartan Potassium 100 Mg Tabs (Losartan potassium) .Marland Kitchen... Take 1 by mouth once daily  Orders: Venipuncture (47425) Specimen Handling (95638) T- Hemoglobin A1C (75643-32951)  Problem # 2:  REFLUX ESOPHAGITIS  (ICD-530.11) Assessment: Improved doing better on the nexium failed prevacid  Problem # 3:  HYPERLIPIDEMIA (ICD-272.4) Assessment: Unchanged at goal no changes  His updated medication list for this problem includes:    Simvastatin 80 Mg Tabs (Simvastatin) .Marland Kitchen... Take one by mouth once a day  Labs Reviewed: SGOT: 103 (02/02/2009)   SGPT: 146 (02/02/2009)   HDL:33.80 (09/03/2008), 31.5 (03/05/2008)  LDL:96 (09/03/2008), 111 (88/41/6606)  Chol:145 (09/03/2008), 161 (03/05/2008)  Trig:77.0 (09/03/2008), 94 (03/05/2008)  Problem # 4:  Preventive Health Care (ICD-V70.0)  Complete Medication List: 1)  Metformin Hcl 500 Mg Tabs (Metformin hcl) .... Take 2 by mouth two times a day 2)  Glipizide 5 Mg Tabs (Glipizide) .... Take one by mouth two times a day 3)  Simvastatin 80 Mg Tabs (Simvastatin) .... Take one by mouth once a day 4)  Synthroid 75 Mcg Tabs (Levothyroxine sodium) .... Take one by mouth once a day 5)  Onetouch Ultra Test Strp (Glucose blood) .... Use as directed dx: 250.40 patient tests twice a week. 6)  Onetouch Lancets Misc (Lancets) .... Dx: 250.40 use as directed, patient tests twice weekly 7)  Nexium 40 Mg Cpdr (Esomeprazole magnesium) .Marland Kitchen.. 1 daily on empty stomach 8)  Losartan Potassium 100 Mg Tabs (Losartan potassium) .... Take 1 by mouth once daily  Patient Instructions: 1)  Please schedule a follow-up appointment in 6 months .  Prescriptions: SIMVASTATIN 80 MG  TABS (SIMVASTATIN) Take one by mouth once a day  #90 x 3   Entered by:   Mervin Hack CMA (AAMA)   Authorized by:   Cindee Salt MD   Signed by:   Mervin Hack CMA (AAMA) on 03/27/2009   Method used:   Print then Give to Patient   RxID:   [id number] KGURKYHC POTASSIUM 100 MG TABS (LOSARTAN POTASSIUM) take 1 by mouth once daily  #90 x 3   Entered by:   Mervin Hack CMA (AAMA)   Authorized by:   Cindee Salt MD   Signed by:   Mervin Hack CMA (AAMA) on 03/27/2009   Method used:    Print then Give to Patient   RxID:   [id number] SYNTHROID 75 MCG  TABS (LEVOTHYROXINE SODIUM) Take one by mouth once a day  #90 x 3   Entered by:   Mervin Hack CMA (AAMA)   Authorized by:   Cindee Salt MD   Signed by:   Mervin Hack CMA (AAMA) on 03/27/2009   Method used:   Print then Give to Patient   RxID:   [id number] GLIPIZIDE 5 MG  TABS (GLIPIZIDE) Take one by mouth two times a day  #180 x 3  Entered by:   Mervin Hack CMA (AAMA)   Authorized by:   Cindee Salt MD   Signed by:   Mervin Hack CMA (AAMA) on 03/27/2009   Method used:   Print then Give to Patient   RxID:   [id number] METFORMIN HCL 500 MG  TABS (METFORMIN HCL) Take 2 by mouth two times a day  #360 x 3   Entered by:   Mervin Hack CMA (AAMA)   Authorized by:   Cindee Salt MD   Signed by:   Mervin Hack CMA (AAMA) on 03/27/2009   Method used:   Print then Give to Patient   RxID:   [id number]   Current Allergies (reviewed today): LIPITOR (ATORVASTATIN CALCIUM)

## 2010-02-11 NOTE — Progress Notes (Signed)
Summary: pt requests muscle relaxer  Phone Note Call from Patient Call back at Home Phone [phone]   Caller: Patient Summary of Call: Pt has pain mid lower back x one week.  He says he was lifting a heavy object and carried it up some steps.  He has had this problem before but says it goes away after about 4-5 days, this is not getting any better.  He is asking for a muscle relaxer to be called to [facility].  Please let pt know. Initial call taken by: Lowella Petties CMA, AAMA,  January 25, 2010 10:30 AM  Follow-up for Phone Call        Please let him know muscle relaxer sent Needs appt soon if not improving Follow-up by: Cindee Salt MD,  January 25, 2010 1:42 PM  Additional Follow-up for Phone Call Additional follow up Details #1::        Spoke with patient and advised results.  Additional Follow-up by: Mervin Hack CMA (AAMA),  January 25, 2010 3:06 PM    New/Updated Medications: METHOCARBAMOL 500 MG TABS (METHOCARBAMOL) 1 tab by mouth three times a day as needed for muscle spasm Prescriptions: METHOCARBAMOL 500 MG TABS (METHOCARBAMOL) 1 tab by mouth three times a day as needed for muscle spasm  #40 x 0   Entered and Authorized by:   Cindee Salt MD   Signed by:   Cindee Salt MD on 01/25/2010   Method used:   Electronically to        [facility]  [address] [id number] (ret[facility])       [address]       [place], [place]  [address]       Ph: [phone] or [phone]       Fax: [phone]   RxID:   [id number]

## 2010-02-11 NOTE — Progress Notes (Signed)
Summary: ? lab  Phone Note Call from Patient Call back at Home Phone [phone]   Caller: Patient Call For: Cindee Salt MD Summary of Call: Patient walked in wanting lab work done before his appt can you please order which labs you would like to be done. Patient says that he likes to have them done before appt so that you can go over them with him.Consuello Masse CMA   Initial call taken by: Benny Lennert CMA Duncan Dull),  February 01, 2010 1:19 PM  Follow-up for Phone Call        okay to get HgbA1c   250.00 lipid, hepatic    272.4 renal, CBC with diff-- 401.9 TSH, free T4--   244.9 Follow-up by: Cindee Salt MD,  February 01, 2010 1:39 PM  Additional Follow-up for Phone Call Additional follow up Details #1::        pt scheduled Additional Follow-up by: Liane Comber CMA (AAMA),  February 04, 2010 11:07 AM

## 2010-03-02 ENCOUNTER — Other Ambulatory Visit: Payer: Self-pay | Admitting: Internal Medicine

## 2010-03-02 ENCOUNTER — Other Ambulatory Visit (INDEPENDENT_AMBULATORY_CARE_PROVIDER_SITE_OTHER): Payer: Medicare Other

## 2010-03-02 ENCOUNTER — Encounter (INDEPENDENT_AMBULATORY_CARE_PROVIDER_SITE_OTHER): Payer: Self-pay | Admitting: *Deleted

## 2010-03-02 DIAGNOSIS — E1129 Type 2 diabetes mellitus with other diabetic kidney complication: Secondary | ICD-10-CM

## 2010-03-02 DIAGNOSIS — E785 Hyperlipidemia, unspecified: Secondary | ICD-10-CM

## 2010-03-02 DIAGNOSIS — I10 Essential (primary) hypertension: Secondary | ICD-10-CM | POA: Insufficient documentation

## 2010-03-02 DIAGNOSIS — E039 Hypothyroidism, unspecified: Secondary | ICD-10-CM

## 2010-03-02 LAB — CBC WITH DIFFERENTIAL/PLATELET
Basophils Absolute: 0 10*3/uL (ref 0.0–0.1)
Eosinophils Absolute: 0.4 10*3/uL (ref 0.0–0.7)
Hemoglobin: 14.8 g/dL (ref 13.0–17.0)
Lymphs Abs: 1.9 10*3/uL (ref 0.7–4.0)
Neutro Abs: 5.8 10*3/uL (ref 1.4–7.7)
RDW: 13.6 % (ref 11.5–14.6)

## 2010-03-02 LAB — RENAL FUNCTION PANEL
CO2: 29 mEq/L (ref 19–32)
Calcium: 9.3 mg/dL (ref 8.4–10.5)
Chloride: 105 mEq/L (ref 96–112)
GFR: 79.93 mL/min (ref >60.00)
Sodium: 142 mEq/L (ref 135–145)

## 2010-03-02 LAB — T4, FREE: Free T4: 1.09 ng/dL (ref 0.60–1.60)

## 2010-03-02 LAB — HEPATIC FUNCTION PANEL
ALT: 13 U/L (ref 0–53)
AST: 15 U/L (ref 0–37)
Albumin: 4 g/dL (ref 3.5–5.2)
Alkaline Phosphatase: 73 U/L (ref 39–117)
Total Protein: 6.6 g/dL (ref 6.0–8.3)

## 2010-03-02 LAB — LIPID PANEL
HDL: 35 mg/dL — ABNORMAL LOW (ref >39.00)
Total CHOL/HDL Ratio: 4
Triglycerides: 175 mg/dL — ABNORMAL HIGH (ref 0.0–149.0)

## 2010-03-02 LAB — HEMOGLOBIN A1C: Hgb A1c MFr Bld: 8.5 % — ABNORMAL HIGH (ref 4.6–6.5)

## 2010-03-09 ENCOUNTER — Encounter: Payer: Self-pay | Admitting: Internal Medicine

## 2010-03-09 ENCOUNTER — Ambulatory Visit (INDEPENDENT_AMBULATORY_CARE_PROVIDER_SITE_OTHER): Payer: Medicare Other | Admitting: Internal Medicine

## 2010-03-09 DIAGNOSIS — E1129 Type 2 diabetes mellitus with other diabetic kidney complication: Secondary | ICD-10-CM

## 2010-03-09 DIAGNOSIS — E785 Hyperlipidemia, unspecified: Secondary | ICD-10-CM

## 2010-03-09 DIAGNOSIS — S335XXA Sprain of ligaments of lumbar spine, initial encounter: Secondary | ICD-10-CM

## 2010-03-09 DIAGNOSIS — K2100 Gastro-esophageal reflux disease with esophagitis, without bleeding: Secondary | ICD-10-CM

## 2010-03-18 NOTE — Assessment & Plan Note (Signed)
Summary: 6 MONTH FOLLOW UP/DLO   Vital Signs:  Patient profile:   70 year old male Weight:      188 pounds Temp:     98.7 degrees F oral Pulse rate:   74 / minute Pulse rhythm:   regular BP sitting:   113 / 63  (left arm) Cuff size:   large  Vitals Entered By: Mervin Hack CMA Duncan Dull) (March 09, 2010 10:53 AM) CC: follow-up   History of Present Illness: Doing fairly well  Did have bout of back pain after carrying pot bellied stove up steps Occurred  ~ 1 month ago tried muscle relaxer that didn't seem to help Finally did improve after a couple of weeks still with a knot feeling in posterior right buttock No limitation of ROM of hip  Checks sugars occ averaging  ~130-150 occ spike up if not eating right No hypoglycemic reactions Has been a little less active and eating more---esp after the back injury  stomach has been quiet on the medication occ diarrhea after gallbladder surgery  Allergies: 1)  Lipitor (Atorvastatin Calcium)  Past History:  Past medical, surgical, family and social histories (including risk factors) reviewed for relevance to current acute and chronic problems.  Past Medical History: Reviewed history from 02/28/2007 and no changes required. NIDDM with nephropathy Hyperlipidemia Hypothyroidism Melanoma GERD/reflux esophagiits Erectile dysfunction Nephrolithiasis, hx of---passed 1/09  Past Surgical History: Reviewed history from 03/27/2009 and no changes required. Pilonidal cyst 1960 Left knee arthroscopy 2000 Melanoma- right arm 2006 Left rotator cuff repair Left foot (toe) surgery Right carpal tunnel 03/03 Exercise treadmill, negative 09/92 Cholecystectomy  2/11  Family History: Reviewed history from 08/22/2006 and no changes required. Father: Died at 66, leukemia Mother: Died at age 49-- MI, DM Siblings: 2 Brothers, one deceased from drowning (boat accident), one died in his 50's, MI.  One sister died in her 45's ?, bad  arthritis CAD in  Mom, brother DM in  Mom No prostate or colon cancer  Social History: Reviewed history from 08/22/2006 and no changes required. 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         sleeps okay nocturia stable usually once weight up a few pounds  Physical Exam  General:  alert and normal appearance.   Neck:  supple, no masses, no thyromegaly, and no cervical lymphadenopathy.   Lungs:  normal respiratory effort, no intercostal retractions, no accessory muscle use, and normal breath sounds.   Heart:  normal rate, regular rhythm, no murmur, and no gallop.   Abdomen:  soft, non-tender, and no masses.   Msk:  no joint tenderness and no joint swelling.   No back tenderness Fairly normal ext rotation of hips with limited internal rotation SLR negative Pulses:  1+ in feet Extremities:  no edema Neurologic:  strength normal in all extremities and gait normal.   Skin:  no suspicious lesions and no ulcerations.   Psych:  normally interactive, good eye contact, not anxious appearing, and not depressed appearing.    Diabetes Management Exam:    Foot Exam (with socks and/or shoes not present):       Sensory-Pinprick/Light touch:          Left medial foot (L-4): normal          Left dorsal foot (L-5): normal  Left lateral foot (S-1): normal          Right medial foot (L-4): normal          Right dorsal foot (L-5): normal          Right lateral foot (S-1): normal       Inspection:          Left foot: normal          Right foot: normal       Nails:          Left foot: normal          Right foot: normal   Impression & Recommendations:  Problem # 1:  LUMBAR SPRAIN AND STRAIN (ICD-847.2) Assessment New seems mild and mostly better reassured  Problem # 2:  DIABETES MELLITUS, TYPE II, CONTROLLED,  W/RENAL COMPS (ICD-250.40) Assessment: Deteriorated discussed worsened controlled he will work on lifestyle issues and we will recheck before next appt  His updated medication list for this problem includes:    Losartan Potassium 100 Mg Tabs (Losartan potassium) .Marland Kitchen... Take 1 by mouth once daily    Metformin Hcl 500 Mg Tabs (Metformin hcl) .Marland Kitchen... Take 2 by mouth two times a day    Glipizide 5 Mg Tabs (Glipizide) .Marland Kitchen... Take one by mouth two times a day  Labs Reviewed: Creat: 1.0 (03/02/2010)     Last Eye Exam: No diabetic retinopathy.    (04/21/2009) Reviewed HgBA1c results: 8.5 (03/02/2010)  7.6 (09/09/2009)  Problem # 3:  REFLUX ESOPHAGITIS (ICD-530.11) Assessment: Unchanged controlled with the med  Problem # 4:  HYPERLIPIDEMIA (ICD-272.4) Assessment: Unchanged at goal  His updated medication list for this problem includes:    Simvastatin 80 Mg Tabs (Simvastatin) .Marland Kitchen... Take one by mouth once a day  Labs Reviewed: SGOT: 15 (03/02/2010)   SGPT: 13 (03/02/2010)   HDL:35.00 (03/02/2010), 34.70 (09/09/2009)  LDL:74 (03/02/2010), 96 (09/09/2009)  Chol:144 (03/02/2010), 159 (09/09/2009)  Trig:175.0 (03/02/2010), 144.0 (09/09/2009)  Problem # 5:  UNSPECIFIED ESSENTIAL HYPERTENSION (ICD-401.9) Assessment: Unchanged good control no changes needed  His updated medication list for this problem includes:    Losartan Potassium 100 Mg Tabs (Losartan potassium) .Marland Kitchen... Take 1 by mouth once daily  BP today: 113/63 Prior BP: 110/68 (09/15/2009)  Labs Reviewed: K+: 4.8 (03/02/2010) Creat: : 1.0 (03/02/2010)   Chol: 144 (03/02/2010)   HDL: 35.00 (03/02/2010)   LDL: 74 (03/02/2010)   TG: 175.0 (03/02/2010)  Complete Medication List: 1)  Losartan Potassium 100 Mg Tabs (Losartan potassium) .... Take 1 by mouth once daily 2)  Metformin Hcl 500 Mg Tabs (Metformin hcl) .... Take 2 by mouth two times a day 3)  Glipizide 5 Mg Tabs (Glipizide) .... Take one by mouth two times a day 4)   Simvastatin 80 Mg Tabs (Simvastatin) .... Take one by mouth once a day 5)  Nexium 40 Mg Cpdr (Esomeprazole magnesium) .Marland Kitchen.. 1 daily on empty stomach 6)  Methocarbamol 500 Mg Tabs (Methocarbamol) .Marland Kitchen.. 1 tab by mouth three times a day as needed for muscle spasm 7)  Levoxyl 75 Mcg Tabs (Levothyroxine sodium) .... Take one tablet daily 8)  Onetouch Ultra Test Strp (Glucose blood) .... Use as directed dx: 250.40 patient tests twice a week. 9)  Onetouch Lancets Misc (Lancets) .... Dx: 250.40 use as directed, patient tests twice weekly  Patient Instructions: 1)  Please schedule a follow-up appointment in 6 months .  Prescriptions: LEVOXYL 75 MCG TABS (LEVOTHYROXINE SODIUM) Take one tablet daily  #90 x 3  Entered by:   Mervin Hack CMA (AAMA)   Authorized by:   Cindee Salt MD   Signed by:   Mervin Hack CMA (AAMA) on 03/09/2010   Method used:   Print then Give to Patient   RxID:   [id number] NEXIUM 40 MG CPDR (ESOMEPRAZOLE MAGNESIUM) 1 daily on empty stomach  #90 x 3   Entered by:   Mervin Hack CMA (AAMA)   Authorized by:   Cindee Salt MD   Signed by:   Mervin Hack CMA (AAMA) on 03/09/2010   Method used:   Print then Give to Patient   RxID:   [id number] SIMVASTATIN 80 MG  TABS (SIMVASTATIN) Take one by mouth once a day  #90 x 3   Entered by:   Mervin Hack CMA (AAMA)   Authorized by:   Cindee Salt MD   Signed by:   Mervin Hack CMA (AAMA) on 03/09/2010   Method used:   Print then Give to Patient   RxID:   [id number] GLIPIZIDE 5 MG  TABS (GLIPIZIDE) Take one by mouth two times a day  #180 x 3   Entered by:   Mervin Hack CMA (AAMA)   Authorized by:   Cindee Salt MD   Signed by:   Mervin Hack CMA (AAMA) on 03/09/2010   Method used:   Print then Give to Patient   RxID:   [id number] METFORMIN HCL 500 MG  TABS (METFORMIN HCL) Take 2 by mouth two times a day  #360 x 3   Entered by:   Mervin Hack CMA (AAMA)    Authorized by:   Cindee Salt MD   Signed by:   Mervin Hack CMA (AAMA) on 03/09/2010   Method used:   Print then Give to Patient   RxID:   [id number] DUKGURKY POTASSIUM 100 MG TABS (LOSARTAN POTASSIUM) take 1 by mouth once daily  #90 x 3   Entered by:   Mervin Hack CMA (AAMA)   Authorized by:   Cindee Salt MD   Signed by:   Mervin Hack CMA (AAMA) on 03/09/2010   Method used:   Print then Give to Patient   RxID:   [id number]    Orders Added: 1)  Est. Patient Level IV [60737]    Current Allergies (reviewed today): LIPITOR (ATORVASTATIN CALCIUM)

## 2010-03-28 LAB — URINALYSIS, ROUTINE W REFLEX MICROSCOPIC
Glucose, UA: >1000 mg/dL — AB
Ketones, ur: >80 mg/dL — AB
Leukocytes, UA: NEGATIVE
Nitrite: NEGATIVE
Protein, ur: 30 mg/dL — AB
Urobilinogen, UA: 1 mg/dL (ref 0.0–1.0)

## 2010-03-28 LAB — COMPREHENSIVE METABOLIC PANEL
ALT: 135 U/L — ABNORMAL HIGH (ref 0–53)
AST: 162 U/L — ABNORMAL HIGH (ref 0–37)
Albumin: 4 g/dL (ref 3.5–5.2)
Alkaline Phosphatase: 246 U/L — ABNORMAL HIGH (ref 39–117)
BUN: 15 mg/dL (ref 6–23)
CO2: 22 mEq/L (ref 19–32)
Calcium: 9.3 mg/dL (ref 8.4–10.5)
Chloride: 102 mEq/L (ref 96–112)
Creatinine, Ser: 0.98 mg/dL (ref 0.4–1.5)
GFR calc Af Amer: >60 mL/min (ref >60)
GFR calc non Af Amer: >60 mL/min (ref >60)
Glucose, Bld: 275 mg/dL — ABNORMAL HIGH (ref 70–99)
Potassium: 3.4 mEq/L — ABNORMAL LOW (ref 3.5–5.1)
Sodium: 136 mEq/L (ref 135–145)
Total Bilirubin: 3.9 mg/dL — ABNORMAL HIGH (ref 0.3–1.2)
Total Protein: 7.4 g/dL (ref 6.0–8.3)

## 2010-03-28 LAB — LIPASE, BLOOD: Lipase: 95 U/L — ABNORMAL HIGH (ref 11–59)

## 2010-03-28 LAB — POCT I-STAT, CHEM 8
BUN: 15 mg/dL (ref 6–23)
Creatinine, Ser: 0.8 mg/dL (ref 0.4–1.5)
Glucose, Bld: 273 mg/dL — ABNORMAL HIGH (ref 70–99)
Hemoglobin: 15.3 g/dL (ref 13.0–17.0)
TCO2: 23 mmol/L (ref 0–100)

## 2010-03-28 LAB — CBC
HCT: 44.3 % (ref 39.0–52.0)
Hemoglobin: 14.8 g/dL (ref 13.0–17.0)
WBC: 15.9 10*3/uL — ABNORMAL HIGH (ref 4.0–10.5)

## 2010-03-28 LAB — URINE MICROSCOPIC-ADD ON

## 2010-03-28 LAB — DIFFERENTIAL
Basophils Absolute: 0 10*3/uL (ref 0.0–0.1)
Eosinophils Relative: 1 % (ref 0–5)
Lymphocytes Relative: 5 % — ABNORMAL LOW (ref 12–46)
Neutro Abs: 14.4 10*3/uL — ABNORMAL HIGH (ref 1.7–7.7)
Neutrophils Relative %: 91 % — ABNORMAL HIGH (ref 43–77)

## 2010-03-28 LAB — POCT CARDIAC MARKERS
CKMB, poc: <1 ng/mL — ABNORMAL LOW (ref 1.0–8.0)
Myoglobin, poc: 82 ng/mL (ref 12–200)

## 2010-04-07 ENCOUNTER — Telehealth: Payer: Self-pay | Admitting: *Deleted

## 2010-04-07 NOTE — Telephone Encounter (Signed)
[facility] is calling to ask if ok to fill levothroid with levothyroxine.  Please let them know.

## 2010-04-13 NOTE — Telephone Encounter (Signed)
Yes that is okay Please let him know he will get the generic but that is what I use all the time

## 2010-04-14 NOTE — Telephone Encounter (Signed)
Called [facility] and advised generic ok

## 2010-04-17 LAB — URINALYSIS, ROUTINE W REFLEX MICROSCOPIC
Bilirubin Urine: NEGATIVE
Specific Gravity, Urine: 1.035 — ABNORMAL HIGH (ref 1.005–1.030)
Urobilinogen, UA: 0.2 mg/dL (ref 0.0–1.0)

## 2010-04-17 LAB — URINE MICROSCOPIC-ADD ON

## 2010-05-28 NOTE — Op Note (Signed)
NAME:  Sean Koch, Sean Koch                       ACCOUNT NO.:  [id number]   MEDICAL RECORD NO.:  [id number]                   PATIENT TYPE:  AMB   LOCATION:  [facility]SC                                  FACILITY:  [facility]   PHYSICIAN:  Rose Phi. Maple Hudson, M.D.                DATE OF BIRTH:  [birth year]   DATE OF PROCEDURE:  07/31/2003  DATE OF DISCHARGE:                                 OPERATIVE REPORT   PREOPERATIVE DIAGNOSIS:  Stage I melanoma of the right upper arm.   POSTOPERATIVE DIAGNOSIS:  Stage I melanoma of the right upper arm.   OPERATION:  1. Blue dye injection.  2. Right axillary sentinel lymph node biopsy.  3. Wide excision of melanoma with primary closure.   SURGEON:  Rose Phi. Maple Hudson, M.D.   ANESTHESIA:  General.   OPERATIVE PROCEDURE:  Prior to coming to the operating room, 0.5 millicurie  of technetium sulfur colloid had been injected intradermally around the  melanoma and the right upper arm.   After suitable general anesthesia was induced, 1 cubic centimeter of  Lymphazurin was injected intradermally around the melanoma on the right  upper arm.  We then prepped and draped the whole right arm and the axilla.  Careful scanning of the axilla revealed a single hot spot, and a short  transverse right axillary incision was made.  This node was somewhat  posterior, and we dissected through the subcutaneous tissue and the  clavipectoral fascia, and there was a bluish hot lymph node which was  removed.  There were no other palpable, or blue, or hot nodes.   That was submitted as a sentinel node, and the incision was closed in two  layers with 3-0 Vicryl and subcuticular 4-0 Monocryl and Steri-Strips.   We then rotated the arm across the chest wall, in an outline of  longitudinally oriented elliptical incision with about a 1.5 cm margin  around the previous excision.  The area was then excised and hemostasis  obtained with the cautery.  We mobilized the skin flaps, then closed this  10  x 4.5 cm defect in two layers of 3-0 Vicryl and interrupted nylon sutures.   Dressings were then applied and the patient transferred to the recovery room  in satisfactory condition having tolerated the procedure well.                                               Rose Phi. Maple Hudson, M.D.    PRY/MEDQ  D:  07/31/2003  T:  07/31/2003  Job:  [id number]   cc:   Mosetta Putt, M.D.  [address] [place]  [place] [address]  Fax: [phone]

## 2010-05-28 NOTE — Op Note (Signed)
[place]  Patient:    Sean Koch, Sean Koch Visit Number: [id number] MRN: [id number]          Service Type: DSU Location: [facility] Attending Physician:  Kendell Bane Dictated by:   Lowell Bouton, M.D. Proc. Date: 04/04/01 Admit Date:  04/04/2001   CC:         Carolyne Fiscal, M.D.   Operative Report  PREOPERATIVE DIAGNOSIS:  Right carpal tunnel syndrome.  POSTOPERATIVE DIAGNOSIS:  Right carpal tunnel syndrome.  OPERATION PERFORMED:  Decompression median nerve, right carpal tunnel.  SURGEON:  Lowell Bouton, M.D.  ANESTHESIA:  0.5% Marcaine local with sedation.  OPERATIVE FINDINGS:  The patient had a very narrow carpal canal with no masses present.  The motor branch of the nerve was intact.  DESCRIPTION OF PROCEDURE:  Under 0.5% Marcaine local anesthesia with a tourniquet on the right arm, the right hand was prepped and draped in the usual fashion and after exsanguinating the limb, the tourniquet was inflated to 250 mmHg.  A 3 cm longitudinal incision was made in the palm just ulnar to the thenar crease.  Sharp dissection was carried through the subcutaneous tissues and bleeding points were coagulated.  Blunt dissection was carried down through the superficial palmar fascia distal to the transverse carpal ligament.  A hemostat was placed in the carpal canal up against the hook of the hamate and the transverse carpal ligament was divided on the ulnar border of the median nerve.  The proximal end of the ligament was divided with the scissors after dissecting the nerve away from the undersurface of the ligament.  The carpal canal was then palpated and was found to be adequately decompressed.  The nerve was examined and motor branch identified.  The wound was irrigated with saline and the skin was closed with 4-0 nylon sutures. Sterile dressings were applied followed by a volar wrist splint.  The  patient tolerated the procedure well and went to the recovery room awake and stable in good condition. Dictated by:   Lowell Bouton, M.D. Attending Physician:  Kendell Bane DD:  04/04/01 TD:  04/05/01 Job: [id number]

## 2010-05-28 NOTE — Op Note (Signed)
[place]. [facility]  Patient:    [name], Sean Koch Visit Number: [id number] MRN: [id number]          Service Type: DSU Location: [facility] Attending Physician:  Kendell Bane Dictated by:   Lowell Bouton, M.D. Proc. Date: 04/18/01 Admit Date:  04/18/2001                             Operative Report  PREOPERATIVE DIAGNOSIS:  Left carpal tunnel syndrome.  POSTOPERATIVE DIAGNOSIS:  Left carpal tunnel syndrome.  OPERATION PERFORMED:  Decompression median nerve, left carpal tunnel.  SURGEON:  Lowell Bouton, M.D.  ANESTHESIA:  0.5% Marcaine local with sedation.  OPERATIVE FINDINGS:  The patient had no masses present in the carpal canal. The motor branch was intact.  DESCRIPTION OF PROCEDURE:  Under 0.5% Marcaine local anesthesia with a tourniquet on the left arm, the left hand was prepped and draped in the usual fashion and after exsanguinating the limb, the tourniquet was inflated to 225 mmHg.  A 3 cm longitudinal incision was made in the palm just ulnar to the thenar crease.  Sharp dissection was carried through the subcutaneous tissues down to the superficial palmar fascia.  Blunt dissection was carried distal to the transverse carpal ligament.  A hemostat was placed in the carpal canal up against the hook of the hamate and the transverse carpal ligament was divided on the ulnar border of the median nerve.  The proximal end of the ligament was divided with the scissors after dissecting the nerve away from the undersurface of the ligament.  The carpal canal was then palpated and was found to be adequately decompressed.  The nerve was examined and motor branch identified.  The wound was irrigated with saline and the skin was closed with 4-0 nylon sutures.  Sterile dressings were applied followed by a volar wrist splint.  The patient had the tourniquet released with good circulation to the hand.  He went to the recovery  room awake and stable in good condition. Dictated by:   Lowell Bouton, M.D. Attending Physician:  Kendell Bane DD:  04/18/01 TD:  04/18/01 Job: [id number]

## 2010-08-18 ENCOUNTER — Telehealth: Payer: Self-pay | Admitting: *Deleted

## 2010-08-18 NOTE — Telephone Encounter (Signed)
Patient has an appt on 09-14-10 to see you, but would like to have labs done prior to appt so that you can discuss results with him while he is in the office. Please advise if this is okay.

## 2010-08-18 NOTE — Telephone Encounter (Signed)
Okay to set up labs before appt HgbA1c, urine microal---250.00 Lipid, hepatic  272.4 TSH, free T4  ---hypothyroidism CBC with diff, met B  -- 401.9

## 2010-08-19 NOTE — Telephone Encounter (Signed)
Patient called back and schedule lab appt. 

## 2010-08-19 NOTE — Telephone Encounter (Signed)
.  left message to have patient return my call.  

## 2010-09-02 ENCOUNTER — Other Ambulatory Visit: Payer: Self-pay | Admitting: Internal Medicine

## 2010-09-02 DIAGNOSIS — E039 Hypothyroidism, unspecified: Secondary | ICD-10-CM

## 2010-09-02 DIAGNOSIS — I10 Essential (primary) hypertension: Secondary | ICD-10-CM

## 2010-09-02 DIAGNOSIS — E785 Hyperlipidemia, unspecified: Secondary | ICD-10-CM

## 2010-09-02 DIAGNOSIS — E1129 Type 2 diabetes mellitus with other diabetic kidney complication: Secondary | ICD-10-CM

## 2010-09-08 ENCOUNTER — Other Ambulatory Visit (INDEPENDENT_AMBULATORY_CARE_PROVIDER_SITE_OTHER): Payer: Medicare Other

## 2010-09-08 DIAGNOSIS — I10 Essential (primary) hypertension: Secondary | ICD-10-CM

## 2010-09-08 DIAGNOSIS — E039 Hypothyroidism, unspecified: Secondary | ICD-10-CM

## 2010-09-08 DIAGNOSIS — E1129 Type 2 diabetes mellitus with other diabetic kidney complication: Secondary | ICD-10-CM

## 2010-09-08 DIAGNOSIS — E785 Hyperlipidemia, unspecified: Secondary | ICD-10-CM

## 2010-09-08 LAB — CBC WITH DIFFERENTIAL/PLATELET
Basophils Relative: 0.3 % (ref 0.0–3.0)
Eosinophils Relative: 4.4 % (ref 0.0–5.0)
HCT: 44.4 % (ref 39.0–52.0)
Lymphs Abs: 1.6 10*3/uL (ref 0.7–4.0)
MCV: 93.3 fl (ref 78.0–100.0)
Monocytes Absolute: 0.5 10*3/uL (ref 0.1–1.0)
Neutro Abs: 6.1 10*3/uL (ref 1.4–7.7)
RBC: 4.76 Mil/uL (ref 4.22–5.81)
WBC: 8.6 10*3/uL (ref 4.5–10.5)

## 2010-09-08 LAB — BASIC METABOLIC PANEL
BUN: 19 mg/dL (ref 6–23)
Calcium: 9 mg/dL (ref 8.4–10.5)
GFR: 72.91 mL/min (ref >60.00)
Glucose, Bld: 177 mg/dL — ABNORMAL HIGH (ref 70–99)

## 2010-09-08 LAB — LIPID PANEL
Cholesterol: 126 mg/dL (ref 0–200)
LDL Cholesterol: 70 mg/dL (ref 0–99)
Triglycerides: 90 mg/dL (ref 0.0–149.0)

## 2010-09-08 LAB — HEPATIC FUNCTION PANEL
AST: 15 U/L (ref 0–37)
Albumin: 4.1 g/dL (ref 3.5–5.2)
Alkaline Phosphatase: 67 U/L (ref 39–117)
Total Bilirubin: 0.8 mg/dL (ref 0.3–1.2)

## 2010-09-08 LAB — MICROALBUMIN / CREATININE URINE RATIO: Microalb Creat Ratio: 1.3 mg/g (ref 0.0–30.0)

## 2010-09-10 ENCOUNTER — Encounter: Payer: Self-pay | Admitting: Internal Medicine

## 2010-09-14 ENCOUNTER — Ambulatory Visit (INDEPENDENT_AMBULATORY_CARE_PROVIDER_SITE_OTHER): Payer: Medicare Other | Admitting: Internal Medicine

## 2010-09-14 ENCOUNTER — Encounter: Payer: Self-pay | Admitting: Internal Medicine

## 2010-09-14 DIAGNOSIS — I10 Essential (primary) hypertension: Secondary | ICD-10-CM

## 2010-09-14 DIAGNOSIS — K2100 Gastro-esophageal reflux disease with esophagitis, without bleeding: Secondary | ICD-10-CM

## 2010-09-14 DIAGNOSIS — M549 Dorsalgia, unspecified: Secondary | ICD-10-CM

## 2010-09-14 DIAGNOSIS — E785 Hyperlipidemia, unspecified: Secondary | ICD-10-CM

## 2010-09-14 DIAGNOSIS — E1129 Type 2 diabetes mellitus with other diabetic kidney complication: Secondary | ICD-10-CM

## 2010-09-14 NOTE — Assessment & Plan Note (Signed)
BP Readings from Last 3 Encounters:  09/14/10 120/62  03/09/10 113/63  09/15/09 110/68   Good control No changes needed Urine microal negative

## 2010-09-14 NOTE — Assessment & Plan Note (Signed)
Lab Results  Component Value Date   HGBA1C 8.5* 09/08/2010   Still suboptimal control Discussed insulin or another oral agent--he really wants to hold off Will need to add Rx if goes over 9%

## 2010-09-14 NOTE — Assessment & Plan Note (Signed)
Seems to be muscle injury Clear lifting injury and improvement No worrisome neurologic findings

## 2010-09-14 NOTE — Assessment & Plan Note (Signed)
Lab Results  Component Value Date   LDLCALC 70 09/08/2010   At goal No changes needed

## 2010-09-14 NOTE — Assessment & Plan Note (Signed)
Quiet on the nexium No changes

## 2010-09-14 NOTE — Progress Notes (Signed)
Subjective:    Patient ID: Sean Koch, male    DOB: 1940/11/30, 70 y.o.   MRN: [id number]  HPI Doing okay Did hurt his back again while picking up heavy bag of mortar Hurt for about 2 weeks Now has changed from sharp pain to ache in center of back Notes back pain when lifting right leg No weakness in legs Aleve and resting in bed help Notes it comes back after being up and about for a couple of hours  Checks sugars occ Usually 140-150 and occ higher (but usually in afternoon after eating) No hypoglycemic reactions Feels he is eating pretty well but has been less active since hurting back  No chest pain NO SOB No headaches No sig edema  No trouble with statin No myalgias or stomach trouble  Nexium still works for him Failed previous trial with prevacid No swallowing problems  Current Outpatient Prescriptions on File Prior to Visit  Medication Sig Dispense Refill  . esomeprazole (NEXIUM) 40 MG capsule Take 40 mg by mouth daily before breakfast.        . glipiZIDE (GLUCOTROL) 5 MG tablet Take 5 mg by mouth 2 (two) times daily before a meal.        . glucose blood (ONE TOUCH ULTRA TEST) test strip 1 each by Other route 2 (two) times a week. Use as instructed       . levothyroxine (SYNTHROID, LEVOTHROID) 75 MCG tablet Take 75 mcg by mouth daily.        Marland Kitchen losartan (COZAAR) 100 MG tablet Take 100 mg by mouth daily.        . metFORMIN (GLUCOPHAGE) 500 MG tablet Take 1,000 mg by mouth 2 (two) times daily with a meal.        . methocarbamol (ROBAXIN) 500 MG tablet Take 500 mg by mouth 3 (three) times daily.        . ONE TOUCH LANCETS MISC by Does not apply route 2 (two) times a week.        . simvastatin (ZOCOR) 80 MG tablet Take 80 mg by mouth at bedtime.          Allergies  Allergen Reactions  . Atorvastatin     REACTION: Joint aches    Past Medical History  Diagnosis Date  . NIDDM, uncontrolled, with neuropathy   . Hyperlipidemia   . Thyroid disease   . Melanoma    . GERD (gastroesophageal reflux disease)   . ED (erectile dysfunction)   . History of nephrolithiasis   . Carpal tunnel syndrome of right wrist     Past Surgical History  Procedure Date  . Pilonidal cyst / sinus excision   . Knee arthroscopy   . Rotator cuff repair   . Toe surgery   . Cholecystectomy     Family History  Problem Relation Age of Onset  . Heart disease Mother   . Diabetes Mother   . Heart disease Brother   . Cancer Neg Hx     History   Social History  . Marital Status: Married    Spouse Name: N/A    Number of Children: 2  . Years of Education: N/A   Occupational History  . retired Landscape architect    Social History Main Topics  . Smoking status: Never Smoker   . Smokeless tobacco: Never Used  . Alcohol Use: Yes  . Drug Use: No  . Sexually Active: Not on file   Other Topics Concern  . Not on file  Social History Narrative  . No narrative on file   Review of Systems Sleeps okay usually Nocturia x 1 Appetite is fine     Objective:   Physical Exam  Constitutional: He appears well-developed and well-nourished. No distress.  Neck: Normal range of motion. Neck supple. No thyromegaly present.  Cardiovascular: Normal rate, regular rhythm, normal heart sounds and intact distal pulses.  Exam reveals no gallop.   No murmur heard. Pulmonary/Chest: Effort normal and breath sounds normal. No respiratory distress. He has no wheezes. He has no rales.  Musculoskeletal:       Back flexion to ~75 degrees No spine tenderness Mild right lumbar pain with full right leg elevation  Lymphadenopathy:    He has no cervical adenopathy.  Neurological:       Gait normal No weakness in legs  Skin: No rash noted.       No foot lesions or ulcers  Psychiatric: He has a normal mood and affect. His behavior is normal. Judgment and thought content normal.          Assessment & Plan:

## 2010-09-29 LAB — URINALYSIS, ROUTINE W REFLEX MICROSCOPIC
Glucose, UA: 100 — AB
Ketones, ur: 15 — AB
Protein, ur: NEGATIVE

## 2010-09-29 LAB — POCT I-STAT CREATININE
Creatinine, Ser: 1.4
Operator id: 133351

## 2010-09-29 LAB — I-STAT 8, (EC8 V) (CONVERTED LAB)
Acid-base deficit: 1
Bicarbonate: 23.9
Potassium: 4.1
TCO2: 25
pH, Ven: 7.391 — ABNORMAL HIGH

## 2010-09-29 LAB — URINE MICROSCOPIC-ADD ON

## 2011-02-24 ENCOUNTER — Other Ambulatory Visit: Payer: Self-pay | Admitting: *Deleted

## 2011-02-24 MED ORDER — GLUCOSE BLOOD VI STRP: ORAL_STRIP | Status: DC

## 2011-03-07 ENCOUNTER — Other Ambulatory Visit (INDEPENDENT_AMBULATORY_CARE_PROVIDER_SITE_OTHER): Payer: Medicare Other

## 2011-03-07 DIAGNOSIS — E039 Hypothyroidism, unspecified: Secondary | ICD-10-CM

## 2011-03-07 DIAGNOSIS — E785 Hyperlipidemia, unspecified: Secondary | ICD-10-CM

## 2011-03-07 DIAGNOSIS — E119 Type 2 diabetes mellitus without complications: Secondary | ICD-10-CM

## 2011-03-07 LAB — HEPATIC FUNCTION PANEL
ALT: 11 U/L (ref 0–53)
AST: 14 U/L (ref 0–37)
Alkaline Phosphatase: 68 U/L (ref 39–117)
Bilirubin, Direct: 0 mg/dL (ref 0.0–0.3)
Total Bilirubin: 0.6 mg/dL (ref 0.3–1.2)

## 2011-03-07 LAB — CBC WITH DIFFERENTIAL/PLATELET
Eosinophils Relative: 4.5 % (ref 0.0–5.0)
HCT: 44.2 % (ref 39.0–52.0)
Lymphs Abs: 1.8 10*3/uL (ref 0.7–4.0)
MCV: 93.4 fl (ref 78.0–100.0)
Monocytes Absolute: 0.5 10*3/uL (ref 0.1–1.0)
Platelets: 196 10*3/uL (ref 150.0–400.0)
RDW: 13.5 % (ref 11.5–14.6)
WBC: 7.8 10*3/uL (ref 4.5–10.5)

## 2011-03-07 LAB — BASIC METABOLIC PANEL
BUN: 20 mg/dL (ref 6–23)
Calcium: 9.6 mg/dL (ref 8.4–10.5)
GFR: 66.27 mL/min (ref >60.00)
Glucose, Bld: 157 mg/dL — ABNORMAL HIGH (ref 70–99)
Sodium: 144 mEq/L (ref 135–145)

## 2011-03-07 LAB — LIPID PANEL
LDL Cholesterol: 81 mg/dL (ref 0–99)
Total CHOL/HDL Ratio: 4
VLDL: 25.6 mg/dL (ref 0.0–40.0)

## 2011-03-07 LAB — MICROALBUMIN / CREATININE URINE RATIO: Microalb Creat Ratio: 0.9 mg/g (ref 0.0–30.0)

## 2011-03-07 LAB — HEMOGLOBIN A1C: Hgb A1c MFr Bld: 8.3 % — ABNORMAL HIGH (ref 4.6–6.5)

## 2011-03-14 ENCOUNTER — Encounter: Payer: Self-pay | Admitting: Internal Medicine

## 2011-03-14 ENCOUNTER — Ambulatory Visit (INDEPENDENT_AMBULATORY_CARE_PROVIDER_SITE_OTHER): Payer: Medicare Other | Admitting: Internal Medicine

## 2011-03-14 VITALS — BP 112/62 | HR 74 | Temp 98.0°F | Ht 68.0 in | Wt 187.0 lb

## 2011-03-14 DIAGNOSIS — Z1211 Encounter for screening for malignant neoplasm of colon: Secondary | ICD-10-CM

## 2011-03-14 DIAGNOSIS — K2100 Gastro-esophageal reflux disease with esophagitis, without bleeding: Secondary | ICD-10-CM

## 2011-03-14 DIAGNOSIS — Z0000 Encounter for general adult medical examination without abnormal findings: Secondary | ICD-10-CM | POA: Insufficient documentation

## 2011-03-14 DIAGNOSIS — I10 Essential (primary) hypertension: Secondary | ICD-10-CM

## 2011-03-14 DIAGNOSIS — N058 Unspecified nephritic syndrome with other morphologic changes: Secondary | ICD-10-CM

## 2011-03-14 DIAGNOSIS — E785 Hyperlipidemia, unspecified: Secondary | ICD-10-CM

## 2011-03-14 DIAGNOSIS — E1129 Type 2 diabetes mellitus with other diabetic kidney complication: Secondary | ICD-10-CM

## 2011-03-14 MED ORDER — METFORMIN HCL 500 MG PO TABS: 1000.0000 mg | ORAL_TABLET | Freq: Two times a day (BID) | ORAL | Status: DC

## 2011-03-14 MED ORDER — GLIPIZIDE 5 MG PO TABS: 5.0000 mg | ORAL_TABLET | Freq: Two times a day (BID) | ORAL | Status: DC

## 2011-03-14 MED ORDER — ESOMEPRAZOLE MAGNESIUM 40 MG PO CPDR: 40.0000 mg | DELAYED_RELEASE_CAPSULE | Freq: Every day | ORAL | Status: DC

## 2011-03-14 MED ORDER — LOSARTAN POTASSIUM 100 MG PO TABS: 100.0000 mg | ORAL_TABLET | Freq: Every day | ORAL | Status: DC

## 2011-03-14 MED ORDER — LEVOTHYROXINE SODIUM 75 MCG PO TABS: 75.0000 ug | ORAL_TABLET | Freq: Every day | ORAL | Status: DC

## 2011-03-14 MED ORDER — SIMVASTATIN 80 MG PO TABS: 80.0000 mg | ORAL_TABLET | Freq: Every day | ORAL | Status: DC

## 2011-03-14 NOTE — Assessment & Plan Note (Signed)
Lab Results  Component Value Date   HGBA1C 8.3* 03/07/2011   Not at goal Discussed fitness Will consider januvia next time if still over 8%

## 2011-03-14 NOTE — Assessment & Plan Note (Signed)
BP Readings from Last 3 Encounters:  03/14/11 112/62  09/14/10 120/62  03/09/10 113/63   Good control No changes

## 2011-03-14 NOTE — Assessment & Plan Note (Signed)
No PSA as we have discussed Will do stool immunoassay again Prefers no zostavax Discussed fitness

## 2011-03-14 NOTE — Progress Notes (Signed)
  Subjective:    Patient ID: Sean Koch, male    DOB: 1940/06/29, 71 y.o.   MRN: [id number]  HPI Doing fairly well Continues to monitor sugars, mostly in AM---intermittently occ under 100 but no clinical hypoglycemic reactions Mostly 140-170 Tries to be careful with eating Weight stable  No exercise--discussed  Concerned about MRSA Woman he and wife help diagnosed with this  Still with itching in ears Frequent rhinorrhea--not sick   Review of Systems  Constitutional: Negative for fatigue and unexpected weight change.       No falls Wears seat belt  HENT: Positive for rhinorrhea. Negative for hearing loss, congestion, dental problem and tinnitus.        Regular with dentist  Eyes: Negative for visual disturbance.       No diplopia or unilateral vision loss Blurry due to worsening cataracts  Respiratory: Negative for cough, chest tightness and shortness of breath.   Cardiovascular: Negative for chest pain, palpitations and leg swelling.  Gastrointestinal: Positive for diarrhea. Negative for nausea, vomiting, abdominal pain and blood in stool.       Spells of diarrhea about once a moth Heartburn controlled with nexium--prevacid in past didn't work  Genitourinary: Positive for frequency. Negative for urgency and difficulty urinating.       Nocturia x 1 at most No sexual problems  Musculoskeletal: Positive for arthralgias. Negative for back pain and joint swelling.       Right 2nd finger pain at times--no meds  Skin: Negative for rash.       Lots of moles---nothing has changed lately  Neurological: Negative for dizziness, syncope, weakness, light-headedness, numbness and headaches.  Hematological: Negative for adenopathy. Does not bruise/bleed easily.  Psychiatric/Behavioral: Negative for sleep disturbance and dysphoric mood. The patient is not nervous/anxious.        Objective:   Physical Exam  Constitutional: He is oriented to person, place, and time. He appears  well-developed and well-nourished. No distress.  HENT:  Head: Normocephalic and atraumatic.  Right Ear: External ear normal.  Left Ear: External ear normal.  Mouth/Throat: Oropharynx is clear and moist. No oropharyngeal exudate.  Eyes: Conjunctivae and EOM are normal. Pupils are equal, round, and reactive to light.       Cataracts L>R  Neck: Normal range of motion. Neck supple. No thyromegaly present.  Cardiovascular: Normal rate, regular rhythm, normal heart sounds and intact distal pulses.  Exam reveals no gallop.   No murmur heard. Pulmonary/Chest: Effort normal and breath sounds normal. No respiratory distress. He has no wheezes. He has no rales.  Abdominal: Soft. There is no tenderness.  Musculoskeletal: He exhibits no edema and no tenderness.  Lymphadenopathy:    He has no cervical adenopathy.  Neurological: He is alert and oriented to person, place, and time.  Skin: No rash noted.       Multiple benign keratoses and hemangiomas Few true nevi  Psychiatric: He has a normal mood and affect. His behavior is normal. Judgment and thought content normal.          Assessment & Plan:

## 2011-03-14 NOTE — Assessment & Plan Note (Signed)
Discussed trying to skip days with PPI

## 2011-03-14 NOTE — Assessment & Plan Note (Signed)
Lab Results  Component Value Date   LDLCALC 81 03/07/2011   At goal

## 2011-03-16 ENCOUNTER — Encounter: Payer: Medicare Other | Admitting: Internal Medicine

## 2011-03-23 ENCOUNTER — Encounter: Payer: Medicare Other | Admitting: Internal Medicine

## 2011-03-28 ENCOUNTER — Other Ambulatory Visit: Payer: Medicare Other

## 2011-03-28 DIAGNOSIS — Z1211 Encounter for screening for malignant neoplasm of colon: Secondary | ICD-10-CM

## 2011-03-29 DIAGNOSIS — K2100 Gastro-esophageal reflux disease with esophagitis, without bleeding: Secondary | ICD-10-CM

## 2011-03-30 ENCOUNTER — Encounter: Payer: Self-pay | Admitting: *Deleted

## 2011-05-20 ENCOUNTER — Other Ambulatory Visit: Payer: Self-pay

## 2011-05-20 NOTE — Telephone Encounter (Signed)
[facility] left v/m requesting Levothyroxin 75 mcg substituted for Levoxyl 75 mcg( cannot get Levoxyl from manufacturer). On pts med list it looks like Levothyroxin 75 mcg was sent to [facility] on 03/14/11. Pt seen 03/14/11.

## 2011-05-20 NOTE — Telephone Encounter (Signed)
.  left message on home and cell VM to have patient return my call.

## 2011-05-23 ENCOUNTER — Other Ambulatory Visit: Payer: Self-pay | Admitting: *Deleted

## 2011-05-23 ENCOUNTER — Telehealth: Payer: Self-pay | Admitting: Internal Medicine

## 2011-05-23 DIAGNOSIS — M1710 Unilateral primary osteoarthritis, unspecified knee: Secondary | ICD-10-CM

## 2011-05-23 MED ORDER — LEVOTHYROXINE SODIUM 75 MCG PO TABS: 75.0000 ug | ORAL_TABLET | Freq: Every day | ORAL | Status: DC

## 2011-05-23 NOTE — Telephone Encounter (Signed)
Spoke with patient and [facility] didn't have enough, sent rx to [facility].

## 2011-05-23 NOTE — Telephone Encounter (Signed)
Opened in error

## 2011-05-23 NOTE — Telephone Encounter (Signed)
Try [facility] since that is where his records will be

## 2011-06-02 ENCOUNTER — Telehealth: Payer: Self-pay | Admitting: Internal Medicine

## 2011-06-02 NOTE — Telephone Encounter (Signed)
Phone call from Leilani Able with Dr Thomasena Edis --[facility] Bad knee arthritis Wants to do cortisone shot  Diabetes control has been suboptimal Okay to proceed with injection but advised that he will have to be very careful with his eating Check sugars daily Might need to consider lantus anyway---wouldn't necessarily start now unless sugars prolonged elevated (ie--fastings >200 for over 2 weeks after shot)

## 2011-06-03 NOTE — Telephone Encounter (Signed)
Patient was senn by Dr Daneen Schick at [facility] on 06/02/2011 [facility]

## 2011-08-25 ENCOUNTER — Telehealth: Payer: Self-pay | Admitting: Internal Medicine

## 2011-08-25 NOTE — Telephone Encounter (Signed)
He is only due for a HgbA1c (250.00) We can set up a lab appt

## 2011-08-25 NOTE — Telephone Encounter (Signed)
Patient has an appointment for a 6 mth f/u on 09/14/11.  Patient said he usually has lab work done before his appointment and would like to come in the end of this month for lab work. Does patient need lab work done before his f/u appointment?

## 2011-08-30 ENCOUNTER — Other Ambulatory Visit: Payer: Self-pay | Admitting: Internal Medicine

## 2011-08-30 DIAGNOSIS — E119 Type 2 diabetes mellitus without complications: Secondary | ICD-10-CM

## 2011-09-07 ENCOUNTER — Other Ambulatory Visit (INDEPENDENT_AMBULATORY_CARE_PROVIDER_SITE_OTHER): Payer: Medicare Other

## 2011-09-07 DIAGNOSIS — E119 Type 2 diabetes mellitus without complications: Secondary | ICD-10-CM

## 2011-09-07 LAB — HEMOGLOBIN A1C: Hgb A1c MFr Bld: 8.4 % — ABNORMAL HIGH (ref 4.6–6.5)

## 2011-09-14 ENCOUNTER — Ambulatory Visit (INDEPENDENT_AMBULATORY_CARE_PROVIDER_SITE_OTHER): Payer: Medicare Other | Admitting: Internal Medicine

## 2011-09-14 ENCOUNTER — Other Ambulatory Visit: Payer: Self-pay | Admitting: *Deleted

## 2011-09-14 ENCOUNTER — Encounter: Payer: Self-pay | Admitting: Internal Medicine

## 2011-09-14 VITALS — BP 110/60 | HR 87 | Temp 98.3°F | Ht 68.0 in | Wt 181.0 lb

## 2011-09-14 DIAGNOSIS — E785 Hyperlipidemia, unspecified: Secondary | ICD-10-CM

## 2011-09-14 DIAGNOSIS — K2100 Gastro-esophageal reflux disease with esophagitis, without bleeding: Secondary | ICD-10-CM

## 2011-09-14 DIAGNOSIS — N058 Unspecified nephritic syndrome with other morphologic changes: Secondary | ICD-10-CM

## 2011-09-14 DIAGNOSIS — I10 Essential (primary) hypertension: Secondary | ICD-10-CM

## 2011-09-14 DIAGNOSIS — E1129 Type 2 diabetes mellitus with other diabetic kidney complication: Secondary | ICD-10-CM

## 2011-09-14 MED ORDER — LOSARTAN POTASSIUM 100 MG PO TABS: 100.0000 mg | ORAL_TABLET | Freq: Every day | ORAL | Status: DC

## 2011-09-14 MED ORDER — METFORMIN HCL 500 MG PO TABS: 1000.0000 mg | ORAL_TABLET | Freq: Two times a day (BID) | ORAL | Status: DC

## 2011-09-14 MED ORDER — SIMVASTATIN 80 MG PO TABS: 80.0000 mg | ORAL_TABLET | Freq: Every day | ORAL | Status: DC

## 2011-09-14 MED ORDER — GLIPIZIDE 5 MG PO TABS: 5.0000 mg | ORAL_TABLET | Freq: Two times a day (BID) | ORAL | Status: DC

## 2011-09-14 MED ORDER — ESOMEPRAZOLE MAGNESIUM 40 MG PO CPDR: 40.0000 mg | DELAYED_RELEASE_CAPSULE | Freq: Every day | ORAL | Status: DC

## 2011-09-14 NOTE — Assessment & Plan Note (Signed)
BP Readings from Last 3 Encounters:  09/14/11 110/60  03/14/11 112/62  09/14/10 120/62   At goal No changes needed

## 2011-09-14 NOTE — Assessment & Plan Note (Signed)
Lab Results  Component Value Date   LDLCALC 81 03/07/2011   At goal Labs again next time

## 2011-09-14 NOTE — Progress Notes (Signed)
Subjective:    Patient ID: Sean Koch, male    DOB: 1940/02/27, 71 y.o.   MRN: [id number]  HPI Had left knee pain Saw Dr Thomasena Edis and had cortisone shot---it got much better after that  Lab Results  Component Value Date   HGBA1C 8.4* 09/07/2011   Did lose some weight since last visit Is eating less and differently Wants to hold off on new meds until next visit at least No clinical hypoglycemia Checks sugars only occasionally---every 2-3 weeks. Fasting may be up as high as 160  No chest pain No SOB Has been working hard putting in new gravel, etc along driveway---has felt good  No myalgias or stomach upset on statin Stomach has been quiet on the nexium  Current Outpatient Prescriptions on File Prior to Visit  Medication Sig Dispense Refill  . esomeprazole (NEXIUM) 40 MG capsule Take 1 capsule (40 mg total) by mouth daily before breakfast.  90 capsule  3  . glipiZIDE (GLUCOTROL) 5 MG tablet Take 1 tablet (5 mg total) by mouth 2 (two) times daily before a meal.  180 tablet  3  . glucose blood (ONE TOUCH ULTRA TEST) test strip Use as instructed, to test 2-3 times weekly dx: 250.40  50 each  11  . levothyroxine (SYNTHROID, LEVOTHROID) 75 MCG tablet Take 1 tablet (75 mcg total) by mouth daily.  90 tablet  3  . losartan (COZAAR) 100 MG tablet Take 1 tablet (100 mg total) by mouth daily.  90 tablet  3  . metFORMIN (GLUCOPHAGE) 500 MG tablet Take 2 tablets (1,000 mg total) by mouth 2 (two) times daily with a meal.  360 tablet  3  . ONE TOUCH LANCETS MISC by Does not apply route 2 (two) times a week.        . simvastatin (ZOCOR) 80 MG tablet Take 1 tablet (80 mg total) by mouth at bedtime.  90 tablet  3    Allergies  Allergen Reactions  . Atorvastatin     REACTION: Joint aches    Past Medical History  Diagnosis Date  . NIDDM, uncontrolled, with neuropathy   . Hyperlipidemia   . Thyroid disease   . Melanoma   . GERD (gastroesophageal reflux disease)   . ED (erectile  dysfunction)   . History of nephrolithiasis   . Carpal tunnel syndrome of right wrist     Past Surgical History  Procedure Date  . Pilonidal cyst / sinus excision   . Knee arthroscopy   . Rotator cuff repair   . Toe surgery   . Cholecystectomy     Family History  Problem Relation Age of Onset  . Heart disease Mother   . Diabetes Mother   . Heart disease Brother   . Cancer Neg Hx     History   Social History  . Marital Status: Married    Spouse Name: N/A    Number of Children: 2  . Years of Education: N/A   Occupational History  . retired Landscape architect    Social History Main Topics  . Smoking status: Never Smoker   . Smokeless tobacco: Never Used  . Alcohol Use: Yes  . Drug Use: No  . Sexually Active: Not on file   Other Topics Concern  . Not on file   Social History Narrative   Has living will Wife, then son Milfred, would be health care POAWould accept resuscitation. No prolonged artificial ventilation.Not sure about tube feeds   Review of Systems Generally sleeps okay  Nocturia x 1 Weight is down a few pounds     Objective:   Physical Exam  Constitutional: He appears well-developed and well-nourished. No distress.  Neck: Normal range of motion. Neck supple. No thyromegaly present.  Cardiovascular: Normal rate, regular rhythm, normal heart sounds and intact distal pulses.  Exam reveals no gallop.   No murmur heard. Pulmonary/Chest: Effort normal and breath sounds normal. No respiratory distress. He has no wheezes. He has no rales.  Abdominal: Soft. There is no tenderness.  Musculoskeletal: He exhibits no edema and no tenderness.  Lymphadenopathy:    He has no cervical adenopathy.  Skin: No rash noted. No erythema.       No foot lesions  Psychiatric: He has a normal mood and affect. His behavior is normal.          Assessment & Plan:

## 2011-09-14 NOTE — Assessment & Plan Note (Signed)
Controlled He will give trial of omeprazole to see if less expensive Rx will work for him

## 2011-09-14 NOTE — Assessment & Plan Note (Signed)
Continues to have suboptimal control but stable Everything else is good He wants to hold off on lantus for now---reconsider at physical

## 2011-09-14 NOTE — Patient Instructions (Signed)
Please try over the counter omeprazole instead of the nexium for a couple of weeks or so. If your heartburn is controlled, I can change to this for prescription and it will be less expensive

## 2011-11-16 ENCOUNTER — Telehealth: Payer: Self-pay

## 2011-11-16 NOTE — Telephone Encounter (Signed)
Pt request copy of AVS from last visit to get name of OTC med. Copied and given to pt.

## 2011-12-26 ENCOUNTER — Telehealth: Payer: Self-pay | Admitting: Internal Medicine

## 2011-12-26 NOTE — Telephone Encounter (Signed)
I spoke with patient.  Appointment rescheduled to 04/02/12.

## 2011-12-26 NOTE — Telephone Encounter (Signed)
Please change the appt for him  Thanks

## 2011-12-26 NOTE — Telephone Encounter (Signed)
Pt has an apptmt scheduled for CPE 04/11/2012, but wants to be seen between 03/23/2012 and 04/09/2012 b/c he needs to get his Rx's renewed prior to 04/11/2012. Can you accommodate him? Thank you.

## 2011-12-30 ENCOUNTER — Encounter: Payer: Self-pay | Admitting: Internal Medicine

## 2011-12-30 ENCOUNTER — Other Ambulatory Visit: Payer: Self-pay | Admitting: Otolaryngology

## 2012-02-13 ENCOUNTER — Telehealth: Payer: Self-pay | Admitting: Internal Medicine

## 2012-02-13 NOTE — Telephone Encounter (Signed)
Patient Information:  Caller Name: Lorn  Phone: [phone]  Patient: Sean Koch, Sean Koch  Gender: Male  DOB: May 25, 1940  Age: 72 Years  PCP: Tillman Abide [facility]  Office Follow Up:  Does the office need to follow up with this patient?: No  Instructions For The Office: N/A  RN Note:  RN discussed home  care advice , but advised to call back if back pain or vomiting symptoms reoccur.  Symptoms  Reason For Call & Symptoms: Pt stating he had onset of  mid left back pain on 02/10/2012 . Pain would be intermittent every few hours - but no pain since afternoon on 02/12/2012. Also had vomiting episodes late 02/11/2012  , but no vomiting since 02/12/2012 AM. Today has had no back pain and no  vomiting, Able to eat and drink with no problems. Pt wonders if he was trying  to pass a  kidney stone. Denies urinary symptoms  Reviewed Health History In EMR: Yes  Reviewed Medications In EMR: Yes  Reviewed Allergies In EMR: Yes  Reviewed Surgeries / Procedures: Yes  Date of Onset of Symptoms: 02/10/2012  Treatments Tried: Aleve  Treatments Tried Worked: Yes  Guideline(s) Used:  Back Pain  Disposition Per Guideline:   Home Care  Reason For Disposition Reached:   Back pain  Advice Given:  Reassurance:  Twisting or heavy lifting can cause back pain.  Activity  Keep doing your day-to-day activities if it is not too painful. Staying active is better than resting.  Avoid anything that makes your pain worse. Avoid heavy lifting, twisting, and too much exercise until your back heals.  Call Back If:  Numbness or weakness occur  Bowel/bladder problems occur  Pain lasts for more than 2 weeks  You become worse.  Pain Medicines:  For pain relief, take acetaminophen, ibuprofen, or naproxen.  Use the lowest amount of medicine that makes your pain feel better.

## 2012-02-13 NOTE — Telephone Encounter (Signed)
Noted Will reevaluate if he has any recurrence of symptoms

## 2012-02-14 ENCOUNTER — Emergency Department (HOSPITAL_COMMUNITY): Payer: Medicare Other

## 2012-02-14 ENCOUNTER — Telehealth: Payer: Self-pay | Admitting: Internal Medicine

## 2012-02-14 ENCOUNTER — Emergency Department (HOSPITAL_COMMUNITY)
Admission: EM | Admit: 2012-02-14 | Discharge: 2012-02-14 | Disposition: A | Discharge status: Home / Self Care | Payer: Medicare Other | Attending: Emergency Medicine | Admitting: Emergency Medicine

## 2012-02-14 ENCOUNTER — Encounter (HOSPITAL_COMMUNITY): Payer: Self-pay | Admitting: *Deleted

## 2012-02-14 DIAGNOSIS — E1142 Type 2 diabetes mellitus with diabetic polyneuropathy: Secondary | ICD-10-CM | POA: Insufficient documentation

## 2012-02-14 DIAGNOSIS — R1110 Vomiting, unspecified: Secondary | ICD-10-CM | POA: Insufficient documentation

## 2012-02-14 DIAGNOSIS — K219 Gastro-esophageal reflux disease without esophagitis: Secondary | ICD-10-CM | POA: Insufficient documentation

## 2012-02-14 DIAGNOSIS — E079 Disorder of thyroid, unspecified: Secondary | ICD-10-CM | POA: Insufficient documentation

## 2012-02-14 DIAGNOSIS — E785 Hyperlipidemia, unspecified: Secondary | ICD-10-CM | POA: Insufficient documentation

## 2012-02-14 DIAGNOSIS — Z87442 Personal history of urinary calculi: Secondary | ICD-10-CM | POA: Insufficient documentation

## 2012-02-14 DIAGNOSIS — IMO0001 Reserved for inherently not codable concepts without codable children: Secondary | ICD-10-CM | POA: Insufficient documentation

## 2012-02-14 DIAGNOSIS — Z8739 Personal history of other diseases of the musculoskeletal system and connective tissue: Secondary | ICD-10-CM | POA: Insufficient documentation

## 2012-02-14 DIAGNOSIS — Z85820 Personal history of malignant melanoma of skin: Secondary | ICD-10-CM | POA: Insufficient documentation

## 2012-02-14 DIAGNOSIS — N23 Unspecified renal colic: Secondary | ICD-10-CM

## 2012-02-14 DIAGNOSIS — Z87448 Personal history of other diseases of urinary system: Secondary | ICD-10-CM | POA: Insufficient documentation

## 2012-02-14 DIAGNOSIS — Z79899 Other long term (current) drug therapy: Secondary | ICD-10-CM | POA: Insufficient documentation

## 2012-02-14 DIAGNOSIS — Z9089 Acquired absence of other organs: Secondary | ICD-10-CM | POA: Insufficient documentation

## 2012-02-14 HISTORY — DX: Disorder of kidney and ureter, unspecified: N28.9

## 2012-02-14 LAB — URINALYSIS, ROUTINE W REFLEX MICROSCOPIC
Hgb urine dipstick: NEGATIVE
Specific Gravity, Urine: 1.035 — ABNORMAL HIGH (ref 1.005–1.030)
Urobilinogen, UA: 1 mg/dL (ref 0.0–1.0)

## 2012-02-14 LAB — URINE MICROSCOPIC-ADD ON

## 2012-02-14 MED ORDER — HYDROMORPHONE HCL PF 1 MG/ML IJ SOLN
1.0000 mg | Freq: Once | INTRAMUSCULAR | Status: AC
  Administered 2012-02-14: 1 mg via INTRAMUSCULAR
  Filled 2012-02-14: qty 1

## 2012-02-14 MED ORDER — TAMSULOSIN HCL 0.4 MG PO CAPS: 0.4000 mg | ORAL_CAPSULE | Freq: Every day | ORAL | Status: DC

## 2012-02-14 MED ORDER — KETOROLAC TROMETHAMINE 30 MG/ML IJ SOLN
30.0000 mg | Freq: Once | INTRAMUSCULAR | Status: AC
  Administered 2012-02-14: 30 mg via INTRAMUSCULAR
  Filled 2012-02-14: qty 1

## 2012-02-14 MED ORDER — OXYCODONE-ACETAMINOPHEN 5-325 MG PO TABS: 2.0000 | ORAL_TABLET | ORAL | Status: DC | PRN

## 2012-02-14 NOTE — ED Provider Notes (Signed)
History     CSN: [id number]  Arrival date & time 02/14/12  1205   First MD Initiated Contact with Patient 02/14/12 1222      Chief Complaint  Patient presents with  . Flank Pain    (Consider location/radiation/quality/duration/timing/severity/associated sxs/prior treatment) Patient is a 72 y.o. male presenting with flank pain. The history is provided by the patient.  Flank Pain   patient here with right-sided flank pain which began Saturday and has been colicky. Similar to his prior kidney stones. No fever but has had emesis x2 associated with pain. No dysuria or hematuria. No occasions taken prior to arrival. Nothing makes the symptoms better or worse. Denies any rashes to his flank.  Past Medical History  Diagnosis Date  . NIDDM, uncontrolled, with neuropathy   . Hyperlipidemia   . Thyroid disease   . Melanoma   . GERD (gastroesophageal reflux disease)   . ED (erectile dysfunction)   . History of nephrolithiasis   . Carpal tunnel syndrome of right wrist   . Renal disorder     kidney stones    Past Surgical History  Procedure Date  . Pilonidal cyst / sinus excision   . Knee arthroscopy   . Rotator cuff repair   . Toe surgery   . Cholecystectomy     Family History  Problem Relation Age of Onset  . Heart disease Mother   . Diabetes Mother   . Heart disease Brother   . Cancer Neg Hx     History  Substance Use Topics  . Smoking status: Never Smoker   . Smokeless tobacco: Never Used  . Alcohol Use: No      Review of Systems  Genitourinary: Positive for flank pain.  All other systems reviewed and are negative.    Allergies  Atorvastatin  Home Medications   Current Outpatient Rx  Name  Route  Sig  Dispense  Refill  . ESOMEPRAZOLE MAGNESIUM 40 MG PO CPDR   Oral   Take 1 capsule (40 mg total) by mouth daily before breakfast.   90 capsule   3   . GLIPIZIDE 5 MG PO TABS   Oral   Take 1 tablet (5 mg total) by mouth 2 (two) times daily before a  meal.   180 tablet   3   . GLUCOSE BLOOD VI STRP      Use as instructed, to test 2-3 times weekly dx: 250.40   50 each   11   . LEVOTHYROXINE SODIUM 75 MCG PO TABS   Oral   Take 1 tablet (75 mcg total) by mouth daily.   90 tablet   3   . LOSARTAN POTASSIUM 100 MG PO TABS   Oral   Take 1 tablet (100 mg total) by mouth daily.   90 tablet   3   . METFORMIN HCL 500 MG PO TABS   Oral   Take 2 tablets (1,000 mg total) by mouth 2 (two) times daily with a meal.   360 tablet   3   . ONETOUCH LANCETS MISC   Does not apply   by Does not apply route 2 (two) times a week.           Marland Kitchen SIMVASTATIN 80 MG PO TABS   Oral   Take 1 tablet (80 mg total) by mouth at bedtime.   90 tablet   3     BP 162/78  Pulse 109  Temp 98.4 F (36.9 C) (Oral)  Resp  18  SpO2 94%  Physical Exam  Nursing note and vitals reviewed. Constitutional: He is oriented to person, place, and time. He appears well-developed and well-nourished.  Non-toxic appearance. No distress.  HENT:  Head: Normocephalic and atraumatic.  Eyes: Conjunctivae normal, EOM and lids are normal. Pupils are equal, round, and reactive to light.  Neck: Normal range of motion. Neck supple. No tracheal deviation present. No mass present.  Cardiovascular: Normal rate, regular rhythm and normal heart sounds.  Exam reveals no gallop.   No murmur heard. Pulmonary/Chest: Effort normal and breath sounds normal. No stridor. No respiratory distress. He has no decreased breath sounds. He has no wheezes. He has no rhonchi. He has no rales.  Abdominal: Soft. Normal appearance and bowel sounds are normal. He exhibits no distension. There is no tenderness. There is no rigidity, no rebound, no guarding and no CVA tenderness.  Musculoskeletal: Normal range of motion. He exhibits no edema and no tenderness.  Neurological: He is alert and oriented to person, place, and time. He has normal strength. No cranial nerve deficit or sensory deficit. GCS  eye subscore is 4. GCS verbal subscore is 5. GCS motor subscore is 6.  Skin: Skin is warm and dry. No abrasion and no rash noted.  Psychiatric: He has a normal mood and affect. His speech is normal and behavior is normal.    ED Course  Procedures (including critical care time)   Labs Reviewed  URINALYSIS, ROUTINE W REFLEX MICROSCOPIC   No results found.   No diagnosis found.    MDM  Patient given pain medications for his kidney stone and feels better. Will give referral to urology and prescriptions for opiates as well as Flomax        Toy Baker, MD 02/14/12 1513

## 2012-02-14 NOTE — Telephone Encounter (Signed)
Patient Information:  Caller Name: Omarion  Phone: [phone]  Patient: PolkinhornBaltazar, Pekala  Gender: Male  DOB: [birth year]  Age: 72 Years  PCP: Tillman Abide [facility])  Office Follow Up:  Does the office need to follow up with this patient?: Yes  Instructions For The Office: see notes.   Symptoms  Reason For Call & Symptoms: Pt had called yesterday and spoke with a nurse. He was given care advice. The pain returned in his back last evening at 5pm. Pt is convinced its a kidney stone and wants a CT scan. He has had this in the past and has same sx now.  Reviewed Health History In EMR: Yes  Reviewed Medications In EMR: Yes  Reviewed Allergies In EMR: Yes  Reviewed Surgeries / Procedures: Yes  Date of Onset of Symptoms: 02/11/2012  Treatments Tried: Aleve  Treatments Tried Worked: No  Guideline(s) Used:  Urination Pain - Male  Disposition Per Guideline:   Go to Office Now  Reason For Disposition Reached:   Side (flank) or lower back pain present  Advice Given:  N/A  Patient Refused Recommendation:  Patient Will Follow Up With Office Later  Pt is asking with kindey stone pain if there is any benefit in coming to the office or if he could go straight to ED. If he goes to ED can the office call ahead for him? Or can he see a urologist?

## 2012-02-14 NOTE — Telephone Encounter (Signed)
Left message on cell and home number asking pt to return my call. 

## 2012-02-14 NOTE — ED Notes (Signed)
Patient transported to CT 

## 2012-02-14 NOTE — ED Notes (Signed)
Called CT to check on status of CT results. Reporting will complete the study so it can be read.

## 2012-02-14 NOTE — ED Notes (Signed)
PT with hx of kidney stones to ED c/o L flank pain on Sat that has since moved to L side pain.  Emesis x 2, but denies blood in urine.

## 2012-02-14 NOTE — Telephone Encounter (Signed)
Please call him If he goes to the ER, he will certainly not see a urologist (unless he has an obstruction that requires hospitalization) I cannot call ahead to facilitate anything there  The ER is the quickest place to get a CT scan, though we can order it from the office and usually get it the same day. Usually no action is taken right away when a stone is found, except increasing fluids and giving pain meds  Offer appt here or he can go to ER if he prefers

## 2012-02-15 ENCOUNTER — Telehealth: Payer: Self-pay | Admitting: Internal Medicine

## 2012-02-15 NOTE — Telephone Encounter (Signed)
Molli Knock We can help him if needed---he might do best with [facility] in [place]

## 2012-02-15 NOTE — Telephone Encounter (Signed)
Spoke with patient's wife and advised results  

## 2012-02-15 NOTE — Telephone Encounter (Signed)
Spoke with wife and she states pt is doing ok, he has pain medication and medication to help pass the stone. Per wife they gave him the name and number of a urologist but he has to call for an appt.

## 2012-02-15 NOTE — Telephone Encounter (Signed)
Caller: Ordell/Patient; Phone: [phone]; Reason for Call: Patient states he was seen in the ED yesterday 02/14/12 and diagnosed with kidney stone.  Patient states he was advised by the MD in the ED not to drink fluids due to "causing the stone to swell and not pass.  " Patient states he would like to know what Dr Alphonsus Sias has to say about that.  Pt states he has not eat or drink today and has been vomited several times.  Rx Oxycodone and Tamsulosin given in the ED.  Advised pt to eat before taking pain med and drink to prevent dehydration.  PLEASE F/U WITH PATIENT.  THANK YOU.

## 2012-02-15 NOTE — Telephone Encounter (Signed)
Advised patient's wife. She says pt's vomiting has settled down over the last couple of hours.  Advised her that if it gets worse again he needs to go to ER.

## 2012-02-15 NOTE — Telephone Encounter (Signed)
Agreed, thanks

## 2012-02-15 NOTE — Telephone Encounter (Signed)
Agreed, needs to eat before taking pain med and drink to prevent dehydration.  If she is persistently vomiting and is getting dehydrated, then needs to be back in ER.  Routed to PCP as FYI.

## 2012-02-16 NOTE — Telephone Encounter (Signed)
Spoke with patient and he was confused as to how much of the pain medication he should be taking? Pt states he's taking 12 tablets a day? 2 tabs every 4 hours, pt states he's not in pain but would like to know how long before he passes the stone? Pt states he's not been taking his regular meds because he can't eat? Pt states he took a percocet this morning at 8am and haven't taking any more since then because he can't eat. Please advise

## 2012-02-16 NOTE — Telephone Encounter (Signed)
Please call him Generally you want to increase fluids as greater urine flow may increase the change of passing the stone on his own

## 2012-02-16 NOTE — Telephone Encounter (Signed)
He should not take the pain meds regularly---only if he has pain If he continues to be unable to eat and drink, he will need to go back to the ER

## 2012-02-16 NOTE — Telephone Encounter (Signed)
Spoke with patient and he was wrong about not being able to eat, he can eat but not as much as he would like, also pt states he has 10 out of the 20 tablets of the percocet so he was wrong about that also. Pt states he will call and schedule with [facility] for a follow-up

## 2012-02-16 NOTE — Telephone Encounter (Signed)
okay

## 2012-02-29 ENCOUNTER — Telehealth: Payer: Self-pay | Admitting: Internal Medicine

## 2012-02-29 NOTE — Telephone Encounter (Signed)
Patient Information:  Caller Name: Atlee  Phone: [phone]  Patient: Sean Koch, Sean Koch  Gender: Male  DOB: 01-Jan-1941  Age: 72 Years  PCP: Tillman Abide [facility])  Office Follow Up:  Does the office need to follow up with this patient?: No  Instructions For The Office: N/A   Symptoms  Reason For Call & Symptoms: pt reports he had a kidney stone 2 weeks ago, pt reports since then he has been constipated since then (going 2 days between BMs) and his blood sugars have been running high (200s)  Reviewed Health History In EMR: Yes  Reviewed Medications In EMR: Yes  Reviewed Allergies In EMR: Yes  Reviewed Surgeries / Procedures: Yes  Date of Onset of Symptoms: Unknown  Guideline(s) Used:  Diabetes - High Blood Sugar  Disposition Per Guideline:   Home Care  Reason For Disposition Reached:   Blood glucose 60-240 mg/dl (3.5 -13 mmol/l)  Advice Given:  Treatment - Liquids  Drink at least one glass (8 oz or 240 ml) of water per hour for the next 4 hours. (Reason: adequate hydration will reduce hyperglycemia).  Call Back If:  Blood glucose more than 300 mg/dL (09.8 mmol/l), 2 or more times in a row.  Vomiting lasting more than 4 hours or unable to drink any liquids.  Rapid breathing occurs  You become worse.  Patient Refused Recommendation:  Patient Will Make Own Appointment  pt requested an appt; Appt scheduled for 03/01/12 at 1330

## 2012-03-01 ENCOUNTER — Ambulatory Visit (INDEPENDENT_AMBULATORY_CARE_PROVIDER_SITE_OTHER): Payer: Medicare Other | Admitting: Internal Medicine

## 2012-03-01 ENCOUNTER — Encounter: Payer: Self-pay | Admitting: Internal Medicine

## 2012-03-01 VITALS — BP 108/60 | HR 93 | Temp 98.2°F | Wt 179.0 lb

## 2012-03-01 DIAGNOSIS — K5900 Constipation, unspecified: Secondary | ICD-10-CM | POA: Insufficient documentation

## 2012-03-01 DIAGNOSIS — E1129 Type 2 diabetes mellitus with other diabetic kidney complication: Secondary | ICD-10-CM | POA: Insufficient documentation

## 2012-03-01 MED ORDER — GLUCOSE BLOOD VI STRP: ORAL_STRIP | Status: DC

## 2012-03-01 MED ORDER — LEVOTHYROXINE SODIUM 75 MCG PO TABS: 75.0000 ug | ORAL_TABLET | Freq: Every day | ORAL | Status: DC

## 2012-03-01 MED ORDER — ONETOUCH LANCETS MISC: Status: DC

## 2012-03-01 MED ORDER — METFORMIN HCL 500 MG PO TABS: 1000.0000 mg | ORAL_TABLET | Freq: Two times a day (BID) | ORAL | Status: DC

## 2012-03-01 MED ORDER — GLIPIZIDE 5 MG PO TABS: 5.0000 mg | ORAL_TABLET | Freq: Two times a day (BID) | ORAL | Status: DC

## 2012-03-01 MED ORDER — SIMVASTATIN 80 MG PO TABS: 80.0000 mg | ORAL_TABLET | Freq: Every day | ORAL | Status: DC

## 2012-03-01 MED ORDER — LOSARTAN POTASSIUM 100 MG PO TABS: 100.0000 mg | ORAL_TABLET | Freq: Every day | ORAL | Status: DC

## 2012-03-01 MED ORDER — OMEPRAZOLE 20 MG PO CPDR: 20.0000 mg | DELAYED_RELEASE_CAPSULE | Freq: Every day | ORAL | Status: DC

## 2012-03-01 NOTE — Progress Notes (Signed)
Subjective:    Patient ID: Sean Koch, male    DOB: 1940/06/13, 72 y.o.   MRN: [id number]  HPI Has passed the kidney stone finally--about 9-10 days ago Having a hard time since then "My whole system is out of whack"  No BM for about 10 days after first getting the kidney stone Then every 3 days for a while and now every 2-3 days Tried stool softeners without much success  Sugars running up-- around 200 Random times though No hypoglycemic reactions Has not changed his routine---now determined to start exercising again  Current Outpatient Prescriptions on File Prior to Visit  Medication Sig Dispense Refill  . glipiZIDE (GLUCOTROL) 5 MG tablet Take 1 tablet (5 mg total) by mouth 2 (two) times daily before a meal.  180 tablet  3  . glucose blood (ONE TOUCH ULTRA TEST) test strip Use as instructed, to test 2-3 times weekly dx: 250.40  50 each  11  . levothyroxine (SYNTHROID, LEVOTHROID) 75 MCG tablet Take 1 tablet (75 mcg total) by mouth daily.  90 tablet  3  . losartan (COZAAR) 100 MG tablet Take 1 tablet (100 mg total) by mouth daily.  90 tablet  3  . metFORMIN (GLUCOPHAGE) 500 MG tablet Take 2 tablets (1,000 mg total) by mouth 2 (two) times daily with a meal.  360 tablet  3  . omeprazole (PRILOSEC) 20 MG capsule Take 20 mg by mouth daily.      . ONE TOUCH LANCETS MISC by Does not apply route 2 (two) times a week.        . simvastatin (ZOCOR) 80 MG tablet Take 1 tablet (80 mg total) by mouth at bedtime.  90 tablet  3   No current facility-administered medications on file prior to visit.    Allergies  Allergen Reactions  . Atorvastatin     REACTION: Joint aches    Past Medical History  Diagnosis Date  . NIDDM, uncontrolled, with neuropathy   . Hyperlipidemia   . Thyroid disease   . Melanoma   . GERD (gastroesophageal reflux disease)   . ED (erectile dysfunction)   . History of nephrolithiasis   . Carpal tunnel syndrome of right wrist   . Renal disorder     kidney  stones    Past Surgical History  Procedure Laterality Date  . Pilonidal cyst / sinus excision    . Knee arthroscopy    . Rotator cuff repair    . Toe surgery    . Cholecystectomy      Family History  Problem Relation Age of Onset  . Heart disease Mother   . Diabetes Mother   . Heart disease Brother   . Cancer Neg Hx     History   Social History  . Marital Status: Married    Spouse Name: N/A    Number of Children: 2  . Years of Education: N/A   Occupational History  . retired Landscape architect    Social History Main Topics  . Smoking status: Never Smoker   . Smokeless tobacco: Never Used  . Alcohol Use: No  . Drug Use: No  . Sexually Active: Not on file   Other Topics Concern  . Not on file   Social History Narrative   Has living will    Wife, then son Aedyn, would be health care POA   Would accept resuscitation. No prolonged artificial ventilation.   Not sure about tube feeds   Review of Systems Appetite has been  fine Weight down a few pounds with kidney stone episode    Objective:   Physical Exam  Constitutional: He appears well-developed and well-nourished. No distress.  Neck: Normal range of motion. Neck supple.  Cardiovascular: Normal rate, regular rhythm and normal heart sounds.  Exam reveals no gallop.   No murmur heard. Pulmonary/Chest: Effort normal and breath sounds normal. No respiratory distress. He has no wheezes. He has no rales.  Abdominal: Soft. Bowel sounds are normal. He exhibits no distension. There is no tenderness. There is no rebound and no guarding.  Abdominal bruit heard over both sides of mid abdomen  Lymphadenopathy:    He has no cervical adenopathy.  Psychiatric: He has a normal mood and affect. His behavior is normal.          Assessment & Plan:

## 2012-03-01 NOTE — Assessment & Plan Note (Signed)
Sugars running higher than before Hard to tell how high since random sugars Will check A1c He is going to work on fitness Consider lantus or januvia if >>9%

## 2012-03-01 NOTE — Addendum Note (Signed)
Addended by: Sueanne Margarita on: 03/01/2012 02:15 PM   Modules accepted: Orders

## 2012-03-01 NOTE — Assessment & Plan Note (Signed)
Related to changes in his routine from the kidney stone Discussed using miralax daily till more regular--then can cut back

## 2012-03-01 NOTE — Telephone Encounter (Signed)
Will eval further at [facility]

## 2012-03-01 NOTE — Patient Instructions (Signed)
Please try 1 capful of miralax in a glass of water every day. As you become more regular, you can try to cut back on it.

## 2012-03-27 ENCOUNTER — Other Ambulatory Visit: Payer: Medicare Other

## 2012-04-02 ENCOUNTER — Encounter: Payer: Medicare Other | Admitting: Internal Medicine

## 2012-04-10 ENCOUNTER — Other Ambulatory Visit (INDEPENDENT_AMBULATORY_CARE_PROVIDER_SITE_OTHER): Payer: Medicare Other

## 2012-04-10 DIAGNOSIS — E785 Hyperlipidemia, unspecified: Secondary | ICD-10-CM

## 2012-04-10 DIAGNOSIS — E039 Hypothyroidism, unspecified: Secondary | ICD-10-CM

## 2012-04-10 DIAGNOSIS — E119 Type 2 diabetes mellitus without complications: Secondary | ICD-10-CM

## 2012-04-10 LAB — CBC WITH DIFFERENTIAL/PLATELET
Basophils Absolute: 0 10*3/uL (ref 0.0–0.1)
Basophils Relative: 0.4 % (ref 0.0–3.0)
Hemoglobin: 13.9 g/dL (ref 13.0–17.0)
Lymphocytes Relative: 15.7 % (ref 12.0–46.0)
Monocytes Relative: 5.6 % (ref 3.0–12.0)
Neutro Abs: 7.3 10*3/uL (ref 1.4–7.7)
RBC: 4.53 Mil/uL (ref 4.22–5.81)
WBC: 9.9 10*3/uL (ref 4.5–10.5)

## 2012-04-10 LAB — TSH: TSH: 5.37 u[IU]/mL (ref 0.35–5.50)

## 2012-04-10 LAB — COMPREHENSIVE METABOLIC PANEL
AST: 13 U/L (ref 0–37)
Albumin: 3.9 g/dL (ref 3.5–5.2)
Alkaline Phosphatase: 76 U/L (ref 39–117)
Potassium: 4.2 mEq/L (ref 3.5–5.1)
Sodium: 141 mEq/L (ref 135–145)
Total Protein: 6.6 g/dL (ref 6.0–8.3)

## 2012-04-10 LAB — T4, FREE: Free T4: 0.86 ng/dL (ref 0.60–1.60)

## 2012-04-10 LAB — LIPID PANEL
LDL Cholesterol: 97 mg/dL (ref 0–99)
Total CHOL/HDL Ratio: 5

## 2012-04-11 ENCOUNTER — Encounter: Payer: Medicare Other | Admitting: Internal Medicine

## 2012-04-12 ENCOUNTER — Ambulatory Visit (INDEPENDENT_AMBULATORY_CARE_PROVIDER_SITE_OTHER): Payer: Medicare Other | Admitting: Internal Medicine

## 2012-04-12 ENCOUNTER — Encounter: Payer: Self-pay | Admitting: Internal Medicine

## 2012-04-12 VITALS — BP 120/60 | HR 94 | Temp 98.4°F | Ht 68.0 in | Wt 180.0 lb

## 2012-04-12 DIAGNOSIS — Z1211 Encounter for screening for malignant neoplasm of colon: Secondary | ICD-10-CM

## 2012-04-12 DIAGNOSIS — E1129 Type 2 diabetes mellitus with other diabetic kidney complication: Secondary | ICD-10-CM

## 2012-04-12 DIAGNOSIS — E785 Hyperlipidemia, unspecified: Secondary | ICD-10-CM

## 2012-04-12 DIAGNOSIS — R0609 Other forms of dyspnea: Secondary | ICD-10-CM

## 2012-04-12 DIAGNOSIS — I10 Essential (primary) hypertension: Secondary | ICD-10-CM

## 2012-04-12 DIAGNOSIS — E1165 Type 2 diabetes mellitus with hyperglycemia: Secondary | ICD-10-CM

## 2012-04-12 DIAGNOSIS — R0600 Dyspnea, unspecified: Secondary | ICD-10-CM | POA: Insufficient documentation

## 2012-04-12 DIAGNOSIS — R0989 Other specified symptoms and signs involving the circulatory and respiratory systems: Secondary | ICD-10-CM

## 2012-04-12 DIAGNOSIS — Z0000 Encounter for general adult medical examination without abnormal findings: Secondary | ICD-10-CM

## 2012-04-12 NOTE — Assessment & Plan Note (Signed)
Sounds more like deconditioning than ischemia EKG normal Advised resumption slowly of regular exercise Stress test if gets symptoms like chest pain--or can't get back to exercise

## 2012-04-12 NOTE — Progress Notes (Signed)
Subjective:    Patient ID: Sean Koch, male    DOB: Aug 20, 1940, 72 y.o.   MRN: [id number]  HPI Has had some allergy symptoms Usually uses OTC med with success  No problems with kidney stones Bowels back to normal  Checks sugars 1-2 per week fastings now still 200 though Recent eye exam--may need cataracts removed. Notices decreased night vision  Hasn't been exercising because he is getting DOE very easily No chest pain No cough or wheezing Comfortable at rest  Current Outpatient Prescriptions on File Prior to Visit  Medication Sig Dispense Refill  . glipiZIDE (GLUCOTROL) 5 MG tablet Take 1 tablet (5 mg total) by mouth 2 (two) times daily before a meal.  180 tablet  3  . glucose blood (ONE TOUCH ULTRA TEST) test strip Use as instructed, to test 2-3 times weekly dx: 250.40  100 each  3  . levothyroxine (SYNTHROID, LEVOTHROID) 75 MCG tablet Take 1 tablet (75 mcg total) by mouth daily.  90 tablet  3  . losartan (COZAAR) 100 MG tablet Take 1 tablet (100 mg total) by mouth daily.  90 tablet  3  . metFORMIN (GLUCOPHAGE) 500 MG tablet Take 2 tablets (1,000 mg total) by mouth 2 (two) times daily with a meal.  360 tablet  3  . omeprazole (PRILOSEC) 20 MG capsule Take 1 capsule (20 mg total) by mouth daily.  90 capsule  3  . ONE TOUCH LANCETS MISC Use to test blood sugar 2-3 times weekly dx: 250.40  100 each  3  . simvastatin (ZOCOR) 80 MG tablet Take 1 tablet (80 mg total) by mouth at bedtime.  90 tablet  3   No current facility-administered medications on file prior to visit.    Allergies  Allergen Reactions  . Atorvastatin     REACTION: Joint aches    Past Medical History  Diagnosis Date  . NIDDM, uncontrolled, with neuropathy   . Hyperlipidemia   . Thyroid disease   . Melanoma   . GERD (gastroesophageal reflux disease)   . ED (erectile dysfunction)   . History of nephrolithiasis   . Carpal tunnel syndrome of right wrist   . Renal disorder     kidney stones     Past Surgical History  Procedure Laterality Date  . Pilonidal cyst / sinus excision    . Knee arthroscopy    . Rotator cuff repair    . Toe surgery    . Cholecystectomy      Family History  Problem Relation Age of Onset  . Heart disease Mother   . Diabetes Mother   . Heart disease Brother   . Cancer Neg Hx     History   Social History  . Marital Status: Married    Spouse Name: N/A    Number of Children: 2  . Years of Education: N/A   Occupational History  . retired Landscape architect    Social History Main Topics  . Smoking status: Never Smoker   . Smokeless tobacco: Never Used  . Alcohol Use: No  . Drug Use: No  . Sexually Active: Not on file   Other Topics Concern  . Not on file   Social History Narrative   Has living will    Wife, then son Raymund, would be health care POA   Would accept resuscitation. No prolonged artificial ventilation.   Not sure about tube feeds     Review of Systems  Constitutional: Negative for fatigue and unexpected weight change.  Wears seat  HENT: Positive for hearing loss. Negative for congestion, rhinorrhea, dental problem and tinnitus.        Some hearing problems--did try hearing aides but he didn't like it Regular with dentist  Eyes: Positive for itching. Negative for visual disturbance.  Respiratory: Positive for shortness of breath. Negative for cough and chest tightness.        Recent DOE  Cardiovascular: Negative for chest pain, palpitations and leg swelling.  Gastrointestinal: Negative for nausea, vomiting, abdominal pain, constipation and blood in stool.       No heartburn  Endocrine: Positive for cold intolerance. Negative for heat intolerance.  Genitourinary: Negative for urgency, frequency and difficulty urinating.       Nocturia is stable No sexual problems  Musculoskeletal: Negative for back pain, joint swelling and arthralgias.  Skin: Negative for rash.       Lots of moles---appear benign   Allergic/Immunologic: Positive for environmental allergies. Negative for immunocompromised state.  Neurological: Negative for dizziness, syncope, weakness, numbness and headaches.  Hematological: Negative for adenopathy. Does not bruise/bleed easily.  Psychiatric/Behavioral: Negative for sleep disturbance and dysphoric mood. The patient is not nervous/anxious.        Objective:   Physical Exam  Constitutional: He is oriented to person, place, and time. He appears well-developed and well-nourished. No distress.  HENT:  Head: Normocephalic and atraumatic.  Right Ear: External ear normal.  Left Ear: External ear normal.  Mouth/Throat: Oropharynx is clear and moist. No oropharyngeal exudate.  Eyes: Conjunctivae and EOM are normal. Pupils are equal, round, and reactive to light.  Neck: Normal range of motion. Neck supple. No thyromegaly present.  Cardiovascular: Normal rate, regular rhythm and normal heart sounds.  Exam reveals no gallop.   No murmur heard. Very faint pulses only in feet  Pulmonary/Chest: Effort normal and breath sounds normal. No respiratory distress. He has no wheezes. He has no rales.  Abdominal: Soft. There is no tenderness.  Musculoskeletal: He exhibits no edema and no tenderness.  Lymphadenopathy:    He has no cervical adenopathy.  Neurological: He is alert and oriented to person, place, and time.  Skin: No rash noted. No erythema.  Psychiatric: He has a normal mood and affect. His behavior is normal.          Assessment & Plan:

## 2012-04-12 NOTE — Assessment & Plan Note (Signed)
Lab Results  Component Value Date   HGBA1C 8.6* 04/10/2012   Definitely better If not under 8% next time, should consider lantus

## 2012-04-12 NOTE — Assessment & Plan Note (Signed)
BP Readings from Last 3 Encounters:  04/12/12 120/60  03/01/12 108/60  02/14/12 139/80   Good control on ARB

## 2012-04-12 NOTE — Assessment & Plan Note (Signed)
Really needs to get back to regular exercise No PSA due to age Will do stool immunoassay

## 2012-04-12 NOTE — Assessment & Plan Note (Signed)
Lab Results  Component Value Date   LDLCALC 97 04/10/2012   At goal with Rx  No problems with med

## 2012-04-12 NOTE — Patient Instructions (Addendum)
Please start a coated 81mg  aspirin daily Please slowly build up a daily exercise plan. Call if you get chest pain or are unable to increase what you can do without shortness of breath

## 2012-05-01 ENCOUNTER — Other Ambulatory Visit (INDEPENDENT_AMBULATORY_CARE_PROVIDER_SITE_OTHER): Payer: Medicare Other

## 2012-05-01 DIAGNOSIS — Z1211 Encounter for screening for malignant neoplasm of colon: Secondary | ICD-10-CM

## 2012-08-15 ENCOUNTER — Other Ambulatory Visit: Payer: Self-pay

## 2012-08-30 ENCOUNTER — Other Ambulatory Visit: Payer: Self-pay | Admitting: Internal Medicine

## 2012-08-30 DIAGNOSIS — E1129 Type 2 diabetes mellitus with other diabetic kidney complication: Secondary | ICD-10-CM

## 2012-09-04 ENCOUNTER — Other Ambulatory Visit (INDEPENDENT_AMBULATORY_CARE_PROVIDER_SITE_OTHER): Payer: Medicare Other

## 2012-09-04 DIAGNOSIS — E1129 Type 2 diabetes mellitus with other diabetic kidney complication: Secondary | ICD-10-CM

## 2012-09-13 ENCOUNTER — Ambulatory Visit (INDEPENDENT_AMBULATORY_CARE_PROVIDER_SITE_OTHER): Payer: Medicare Other | Admitting: Internal Medicine

## 2012-09-13 ENCOUNTER — Encounter: Payer: Self-pay | Admitting: Internal Medicine

## 2012-09-13 VITALS — BP 110/70 | HR 74 | Temp 98.1°F | Wt 183.0 lb

## 2012-09-13 DIAGNOSIS — I10 Essential (primary) hypertension: Secondary | ICD-10-CM

## 2012-09-13 DIAGNOSIS — E1149 Type 2 diabetes mellitus with other diabetic neurological complication: Secondary | ICD-10-CM

## 2012-09-13 DIAGNOSIS — IMO0002 Reserved for concepts with insufficient information to code with codable children: Secondary | ICD-10-CM

## 2012-09-13 DIAGNOSIS — M1710 Unilateral primary osteoarthritis, unspecified knee: Secondary | ICD-10-CM

## 2012-09-13 LAB — HM DIABETES FOOT EXAM

## 2012-09-13 NOTE — Progress Notes (Signed)
Subjective:    Patient ID: Sean Koch, male    DOB: May 24, 1940, 72 y.o.   MRN: [id number]  HPI Feels well  Discussed his high sugar control Fasting 150-180 usually No hypoglycemic reactions Has a lot of plans in the next month---going out of town several times this month Has appt for cataract removal soon Very early slight numbness in toes  No chest pain No dizziness or syncope No edema No SOB but notes some DOE if he goes on a long walk  Ongoing knee pain Not taking anything Discussed tylenol before walking  Current Outpatient Prescriptions on File Prior to Visit  Medication Sig Dispense Refill  . aspirin EC 81 MG tablet Take 81 mg by mouth daily.      Marland Kitchen glipiZIDE (GLUCOTROL) 5 MG tablet Take 1 tablet (5 mg total) by mouth 2 (two) times daily before a meal.  180 tablet  3  . glucose blood (ONE TOUCH ULTRA TEST) test strip Use as instructed, to test 2-3 times weekly dx: 250.40  100 each  3  . levothyroxine (SYNTHROID, LEVOTHROID) 75 MCG tablet Take 1 tablet (75 mcg total) by mouth daily.  90 tablet  3  . losartan (COZAAR) 100 MG tablet Take 1 tablet (100 mg total) by mouth daily.  90 tablet  3  . metFORMIN (GLUCOPHAGE) 500 MG tablet Take 2 tablets (1,000 mg total) by mouth 2 (two) times daily with a meal.  360 tablet  3  . omeprazole (PRILOSEC) 20 MG capsule Take 1 capsule (20 mg total) by mouth daily.  90 capsule  3  . ONE TOUCH LANCETS MISC Use to test blood sugar 2-3 times weekly dx: 250.40  100 each  3  . simvastatin (ZOCOR) 80 MG tablet Take 1 tablet (80 mg total) by mouth at bedtime.  90 tablet  3   No current facility-administered medications on file prior to visit.    Allergies  Allergen Reactions  . Atorvastatin     REACTION: Joint aches    Past Medical History  Diagnosis Date  . NIDDM, uncontrolled, with neuropathy   . Hyperlipidemia   . Thyroid disease   . Melanoma   . GERD (gastroesophageal reflux disease)   . ED (erectile dysfunction)   .  History of nephrolithiasis   . Carpal tunnel syndrome of right wrist   . Renal disorder     kidney stones    Past Surgical History  Procedure Laterality Date  . Pilonidal cyst / sinus excision    . Knee arthroscopy    . Rotator cuff repair    . Toe surgery    . Cholecystectomy      Family History  Problem Relation Age of Onset  . Heart disease Mother   . Diabetes Mother   . Heart disease Brother   . Cancer Neg Hx     History   Social History  . Marital Status: Married    Spouse Name: N/A    Number of Children: 2  . Years of Education: N/A   Occupational History  . retired Landscape architect    Social History Main Topics  . Smoking status: Never Smoker   . Smokeless tobacco: Never Used  . Alcohol Use: No  . Drug Use: No  . Sexual Activity: Not on file   Other Topics Concern  . Not on file   Social History Narrative   Has living will    Wife, then son Ryson, would be health care POA   Would  accept resuscitation. No prolonged artificial ventilation.   Not sure about tube feeds   Review of Systems Weight is up 3# Having left knee pain with walking---limits him.       Objective:   Physical Exam  Constitutional: He appears well-developed and well-nourished. No distress.  Neck: Normal range of motion. Neck supple. No thyromegaly present.  Cardiovascular: Normal rate, regular rhythm, normal heart sounds and intact distal pulses.  Exam reveals no gallop.   No murmur heard. Pulmonary/Chest: Effort normal and breath sounds normal. No respiratory distress. He has no wheezes. He has no rales.  Abdominal: Soft. There is no tenderness.  Musculoskeletal: He exhibits no edema and no tenderness.  Lymphadenopathy:    He has no cervical adenopathy.  Neurological:  Normal fine touch sensation on plantar feet  Skin: No rash noted. No erythema.  No foot lesions  Psychiatric: He has a normal mood and affect. His behavior is normal.          Assessment & Plan:

## 2012-09-13 NOTE — Assessment & Plan Note (Signed)
Discussed trying tylenol before walking Needs more exercise ?brace for walking ?cortisone shot

## 2012-09-13 NOTE — Assessment & Plan Note (Signed)
BP Readings from Last 3 Encounters:  09/13/12 110/70  04/12/12 120/60  03/01/12 108/60   Good control No changes needed

## 2012-09-13 NOTE — Assessment & Plan Note (Signed)
Not overly compliant with diet Asked him to cut out the sweets and increase walking Will recheck in 3 months If not better, will set up with endocrine

## 2012-09-13 NOTE — Patient Instructions (Addendum)
Please try acetaminophen-- 650mg  or 1000mg  about 30-60 minutes before you plan a walk.  Please set up blood work for HgbA1c in about 3 months--- 250.62

## 2012-10-30 ENCOUNTER — Other Ambulatory Visit: Payer: Self-pay | Admitting: Dermatology

## 2012-12-07 ENCOUNTER — Other Ambulatory Visit: Payer: Self-pay | Admitting: Internal Medicine

## 2012-12-07 DIAGNOSIS — E1149 Type 2 diabetes mellitus with other diabetic neurological complication: Secondary | ICD-10-CM

## 2012-12-13 ENCOUNTER — Other Ambulatory Visit (INDEPENDENT_AMBULATORY_CARE_PROVIDER_SITE_OTHER): Payer: Medicare Other

## 2012-12-13 DIAGNOSIS — E1149 Type 2 diabetes mellitus with other diabetic neurological complication: Secondary | ICD-10-CM

## 2012-12-13 LAB — HEMOGLOBIN A1C: Hgb A1c MFr Bld: 7.9 % — ABNORMAL HIGH (ref 4.6–6.5)

## 2012-12-17 ENCOUNTER — Ambulatory Visit (INDEPENDENT_AMBULATORY_CARE_PROVIDER_SITE_OTHER)
Admission: RE | Admit: 2012-12-17 | Discharge: 2012-12-17 | Disposition: A | Discharge status: Home / Self Care | Payer: Medicare Other | Source: Ambulatory Visit | Attending: Internal Medicine | Admitting: Internal Medicine

## 2012-12-17 ENCOUNTER — Encounter: Payer: Self-pay | Admitting: Internal Medicine

## 2012-12-17 ENCOUNTER — Ambulatory Visit (INDEPENDENT_AMBULATORY_CARE_PROVIDER_SITE_OTHER): Payer: Medicare Other | Admitting: Internal Medicine

## 2012-12-17 VITALS — BP 124/72 | HR 98 | Temp 98.2°F | Wt 176.5 lb

## 2012-12-17 DIAGNOSIS — R059 Cough, unspecified: Secondary | ICD-10-CM | POA: Insufficient documentation

## 2012-12-17 DIAGNOSIS — R05 Cough: Secondary | ICD-10-CM

## 2012-12-17 DIAGNOSIS — J019 Acute sinusitis, unspecified: Secondary | ICD-10-CM | POA: Insufficient documentation

## 2012-12-17 MED ORDER — AMOXICILLIN-POT CLAVULANATE 875-125 MG PO TABS: 1.0000 | ORAL_TABLET | Freq: Two times a day (BID) | ORAL | Status: DC

## 2012-12-17 NOTE — Assessment & Plan Note (Signed)
No pneumonia apparent on CXR but has findings at least in bronchi Will await formal reading Use augmentin instead of amoxil due to this

## 2012-12-17 NOTE — Assessment & Plan Note (Signed)
Has been sick for a couple of weeks Copious drainage and cough Will try augmentin due to his lung findings (despite the normal CXR)

## 2012-12-17 NOTE — Progress Notes (Signed)
Pre-visit discussion using our clinic review tool. No additional management support is needed unless otherwise documented below in the visit note.  

## 2012-12-17 NOTE — Progress Notes (Signed)
Subjective:    Patient ID: Sean Koch, male    DOB: 04-17-1940, 72 y.o.   MRN: [id number]  HPI Has had post nasal drip Lots of cough with mucus production Started about 2 weeks ago and not getting better No fever Cough is frequent day and night--yellow and green mucus No SOB--some DOE Slight sore throat Left ear tried to pop--no regular pain  Has tried nyquil at night---this has helped mucinex no help Tylenol not helping either  Current Outpatient Prescriptions on File Prior to Visit  Medication Sig Dispense Refill  . aspirin EC 81 MG tablet Take 81 mg by mouth daily.      Marland Kitchen glipiZIDE (GLUCOTROL) 5 MG tablet Take 1 tablet (5 mg total) by mouth 2 (two) times daily before a meal.  180 tablet  3  . glucose blood (ONE TOUCH ULTRA TEST) test strip Use as instructed, to test 2-3 times weekly dx: 250.40  100 each  3  . levothyroxine (SYNTHROID, LEVOTHROID) 75 MCG tablet Take 1 tablet (75 mcg total) by mouth daily.  90 tablet  3  . losartan (COZAAR) 100 MG tablet Take 1 tablet (100 mg total) by mouth daily.  90 tablet  3  . metFORMIN (GLUCOPHAGE) 500 MG tablet Take 2 tablets (1,000 mg total) by mouth 2 (two) times daily with a meal.  360 tablet  3  . omeprazole (PRILOSEC) 20 MG capsule Take 1 capsule (20 mg total) by mouth daily.  90 capsule  3  . ONE TOUCH LANCETS MISC Use to test blood sugar 2-3 times weekly dx: 250.40  100 each  3  . simvastatin (ZOCOR) 80 MG tablet Take 1 tablet (80 mg total) by mouth at bedtime.  90 tablet  3   No current facility-administered medications on file prior to visit.    Allergies  Allergen Reactions  . Atorvastatin     REACTION: Joint aches    Past Medical History  Diagnosis Date  . NIDDM, uncontrolled, with neuropathy   . Hyperlipidemia   . Thyroid disease   . Melanoma   . GERD (gastroesophageal reflux disease)   . ED (erectile dysfunction)   . History of nephrolithiasis   . Carpal tunnel syndrome of right wrist   . Renal disorder       kidney stones    Past Surgical History  Procedure Laterality Date  . Pilonidal cyst / sinus excision    . Knee arthroscopy    . Rotator cuff repair    . Toe surgery    . Cholecystectomy      Family History  Problem Relation Age of Onset  . Heart disease Mother   . Diabetes Mother   . Heart disease Brother   . Cancer Neg Hx     History   Social History  . Marital Status: Married    Spouse Name: N/A    Number of Children: 2  . Years of Education: N/A   Occupational History  . retired Landscape architect    Social History Main Topics  . Smoking status: Never Smoker   . Smokeless tobacco: Never Used  . Alcohol Use: Yes     Comment: rare  . Drug Use: No  . Sexual Activity: Not on file   Other Topics Concern  . Not on file   Social History Narrative   Has living will    Wife, then son Amarri, would be health care POA   Would accept resuscitation. No prolonged artificial ventilation.   Not  sure about tube feeds   Review of Systems Weight is down 6-7# with lifestyle efforts No vomiting or diarrhea Appetite is okay    Objective:   Physical Exam  Constitutional: He appears well-developed and well-nourished. No distress.  HENT:  Mouth/Throat: Oropharynx is clear and moist. No oropharyngeal exudate.  No sinus tenderness TMs not inflamed--?fluid Marked nasal swelling  Neck: Normal range of motion. Neck supple. No thyromegaly present.  Pulmonary/Chest: Effort normal. No respiratory distress. He has no wheezes. He has rales.  Slight bilateral crackles at bases  Lymphadenopathy:    He has no cervical adenopathy.  Skin: No rash noted.          Assessment & Plan:

## 2013-03-05 ENCOUNTER — Other Ambulatory Visit: Payer: Medicare Other

## 2013-03-06 ENCOUNTER — Other Ambulatory Visit: Payer: Self-pay | Admitting: Internal Medicine

## 2013-03-06 DIAGNOSIS — E039 Hypothyroidism, unspecified: Secondary | ICD-10-CM

## 2013-03-06 DIAGNOSIS — E1149 Type 2 diabetes mellitus with other diabetic neurological complication: Secondary | ICD-10-CM

## 2013-03-08 ENCOUNTER — Other Ambulatory Visit: Payer: Medicare Other

## 2013-03-11 LAB — HM DIABETES EYE EXAM

## 2013-03-12 ENCOUNTER — Other Ambulatory Visit (INDEPENDENT_AMBULATORY_CARE_PROVIDER_SITE_OTHER): Payer: Medicare Other

## 2013-03-12 DIAGNOSIS — E039 Hypothyroidism, unspecified: Secondary | ICD-10-CM

## 2013-03-12 DIAGNOSIS — E1149 Type 2 diabetes mellitus with other diabetic neurological complication: Secondary | ICD-10-CM

## 2013-03-12 DIAGNOSIS — E785 Hyperlipidemia, unspecified: Secondary | ICD-10-CM

## 2013-03-12 DIAGNOSIS — I10 Essential (primary) hypertension: Secondary | ICD-10-CM

## 2013-03-12 LAB — COMPREHENSIVE METABOLIC PANEL
ALBUMIN: 4 g/dL (ref 3.5–5.2)
ALT: 10 U/L (ref 0–53)
AST: 12 U/L (ref 0–37)
Alkaline Phosphatase: 62 U/L (ref 39–117)
BUN: 21 mg/dL (ref 6–23)
CALCIUM: 9.4 mg/dL (ref 8.4–10.5)
CHLORIDE: 105 meq/L (ref 96–112)
CO2: 29 meq/L (ref 19–32)
CREATININE: 1.2 mg/dL (ref 0.4–1.5)
GFR: 60.98 mL/min (ref >60.00)
Glucose, Bld: 125 mg/dL — ABNORMAL HIGH (ref 70–99)
POTASSIUM: 4.5 meq/L (ref 3.5–5.1)
Sodium: 140 mEq/L (ref 135–145)
Total Bilirubin: 0.9 mg/dL (ref 0.3–1.2)
Total Protein: 7.1 g/dL (ref 6.0–8.3)

## 2013-03-12 LAB — CBC WITH DIFFERENTIAL/PLATELET
BASOS ABS: 0.1 10*3/uL (ref 0.0–0.1)
Basophils Relative: 0.7 % (ref 0.0–3.0)
EOS ABS: 0.8 10*3/uL — AB (ref 0.0–0.7)
Eosinophils Relative: 8.2 % — ABNORMAL HIGH (ref 0.0–5.0)
HCT: 44.6 % (ref 39.0–52.0)
HEMOGLOBIN: 14.4 g/dL (ref 13.0–17.0)
LYMPHS PCT: 21.8 % (ref 12.0–46.0)
Lymphs Abs: 2.1 10*3/uL (ref 0.7–4.0)
MCHC: 32.2 g/dL (ref 30.0–36.0)
MCV: 93 fl (ref 78.0–100.0)
MONOS PCT: 6 % (ref 3.0–12.0)
Monocytes Absolute: 0.6 10*3/uL (ref 0.1–1.0)
NEUTROS PCT: 63.3 % (ref 43.0–77.0)
Neutro Abs: 6.2 10*3/uL (ref 1.4–7.7)
Platelets: 219 10*3/uL (ref 150.0–400.0)
RBC: 4.8 Mil/uL (ref 4.22–5.81)
RDW: 13.7 % (ref 11.5–14.6)
WBC: 9.7 10*3/uL (ref 4.5–10.5)

## 2013-03-12 LAB — LIPID PANEL
Cholesterol: 129 mg/dL (ref 0–200)
HDL: 34.3 mg/dL — AB (ref >39.00)
LDL Cholesterol: 72 mg/dL (ref 0–99)
TRIGLYCERIDES: 112 mg/dL (ref 0.0–149.0)
Total CHOL/HDL Ratio: 4
VLDL: 22.4 mg/dL (ref 0.0–40.0)

## 2013-03-12 LAB — TSH: TSH: 1.69 u[IU]/mL (ref 0.35–5.50)

## 2013-03-12 LAB — HEMOGLOBIN A1C: Hgb A1c MFr Bld: 8.3 % — ABNORMAL HIGH (ref 4.6–6.5)

## 2013-03-12 LAB — T4, FREE: FREE T4: 0.98 ng/dL (ref 0.60–1.60)

## 2013-03-13 ENCOUNTER — Encounter: Payer: Self-pay | Admitting: Internal Medicine

## 2013-03-13 ENCOUNTER — Ambulatory Visit (INDEPENDENT_AMBULATORY_CARE_PROVIDER_SITE_OTHER): Payer: Medicare Other | Admitting: Internal Medicine

## 2013-03-13 VITALS — BP 100/60 | HR 86 | Temp 98.5°F | Ht 68.0 in | Wt 181.0 lb

## 2013-03-13 DIAGNOSIS — Z1211 Encounter for screening for malignant neoplasm of colon: Secondary | ICD-10-CM

## 2013-03-13 DIAGNOSIS — IMO0002 Reserved for concepts with insufficient information to code with codable children: Secondary | ICD-10-CM

## 2013-03-13 DIAGNOSIS — Z23 Encounter for immunization: Secondary | ICD-10-CM

## 2013-03-13 DIAGNOSIS — K2100 Gastro-esophageal reflux disease with esophagitis, without bleeding: Secondary | ICD-10-CM

## 2013-03-13 DIAGNOSIS — E1142 Type 2 diabetes mellitus with diabetic polyneuropathy: Secondary | ICD-10-CM | POA: Insufficient documentation

## 2013-03-13 DIAGNOSIS — M1710 Unilateral primary osteoarthritis, unspecified knee: Secondary | ICD-10-CM

## 2013-03-13 DIAGNOSIS — E039 Hypothyroidism, unspecified: Secondary | ICD-10-CM

## 2013-03-13 DIAGNOSIS — Z0000 Encounter for general adult medical examination without abnormal findings: Secondary | ICD-10-CM

## 2013-03-13 DIAGNOSIS — E1149 Type 2 diabetes mellitus with other diabetic neurological complication: Secondary | ICD-10-CM

## 2013-03-13 DIAGNOSIS — E785 Hyperlipidemia, unspecified: Secondary | ICD-10-CM

## 2013-03-13 DIAGNOSIS — M179 Osteoarthritis of knee, unspecified: Secondary | ICD-10-CM

## 2013-03-13 MED ORDER — GLIPIZIDE 5 MG PO TABS: 5.0000 mg | ORAL_TABLET | Freq: Two times a day (BID) | ORAL | Status: DC

## 2013-03-13 MED ORDER — GLUCOSE BLOOD VI STRP: ORAL_STRIP | Status: DC

## 2013-03-13 MED ORDER — LOSARTAN POTASSIUM 100 MG PO TABS: 100.0000 mg | ORAL_TABLET | Freq: Every day | ORAL | Status: DC

## 2013-03-13 MED ORDER — METFORMIN HCL 500 MG PO TABS: 1000.0000 mg | ORAL_TABLET | Freq: Two times a day (BID) | ORAL | Status: DC

## 2013-03-13 MED ORDER — ONETOUCH LANCETS MISC: Status: DC

## 2013-03-13 MED ORDER — OMEPRAZOLE 20 MG PO CPDR: 20.0000 mg | DELAYED_RELEASE_CAPSULE | Freq: Every day | ORAL | Status: DC

## 2013-03-13 MED ORDER — LEVOTHYROXINE SODIUM 75 MCG PO TABS: 75.0000 ug | ORAL_TABLET | Freq: Every day | ORAL | Status: DC

## 2013-03-13 MED ORDER — SIMVASTATIN 80 MG PO TABS: 80.0000 mg | ORAL_TABLET | Freq: Every day | ORAL | Status: DC

## 2013-03-13 NOTE — Addendum Note (Signed)
Addended by: Despina Hidden on: 03/13/2013 12:19 PM   Modules accepted: Orders

## 2013-03-13 NOTE — Addendum Note (Signed)
Addended by: Viviana Simpler I on: 03/13/2013 11:58 AM   Modules accepted: Orders

## 2013-03-13 NOTE — Assessment & Plan Note (Signed)
Discussed quad strengthening

## 2013-03-13 NOTE — Assessment & Plan Note (Signed)
Lab Results  Component Value Date   LDLCALC 72 03/12/2013   Good control on statin

## 2013-03-13 NOTE — Progress Notes (Signed)
Subjective:    Patient ID: Sean Koch, male    DOB: 12-04-1940, 73 y.o.   MRN: [id number]  HPI Here for physical and wellness visit Reviewed forms No falls Reviewed advanced directives No depression or anhedonia No exercise due to knee pain---discussed alternatives and quad strengthening Slight hearing loss Vision okay No cognitive problems No tobacco or alcohol Independent with instrumental ADLs  Checks sugars periodically Variable-- 110-150 No hypoglycemic reactions Has been going to ophtho--recent cataracts Still has some numbness and tingling in feet--no sig pain  No problems with statin No myalgias or stomach trouble  Current Outpatient Prescriptions on File Prior to Visit  Medication Sig Dispense Refill  . aspirin EC 81 MG tablet Take 81 mg by mouth daily.       No current facility-administered medications on file prior to visit.    Allergies  Allergen Reactions  . Atorvastatin     REACTION: Joint aches    Past Medical History  Diagnosis Date  . NIDDM, uncontrolled, with neuropathy   . Hyperlipidemia   . Thyroid disease   . Melanoma   . GERD (gastroesophageal reflux disease)   . ED (erectile dysfunction)   . History of nephrolithiasis   . Carpal tunnel syndrome of right wrist   . Renal disorder     kidney stones    Past Surgical History  Procedure Laterality Date  . Pilonidal cyst / sinus excision    . Knee arthroscopy    . Rotator cuff repair    . Toe surgery    . Cholecystectomy    . Cataract extraction w/ intraocular lens implant Bilateral 11/14 & 2/15    Family History  Problem Relation Age of Onset  . Heart disease Mother   . Diabetes Mother   . Heart disease Brother   . Cancer Neg Hx     History   Social History  . Marital Status: Married    Spouse Name: N/A    Number of Children: 2  . Years of Education: N/A   Occupational History  . retired Geneticist, molecular    Social History Main Topics  . Smoking status: Never Smoker    . Smokeless tobacco: Never Used  . Alcohol Use: Yes     Comment: rare  . Drug Use: No  . Sexual Activity: Not on file   Other Topics Concern  . Not on file   Social History Narrative   Has living will    Wife, then son Demarea, would be health care POA   Would accept resuscitation. No prolonged artificial ventilation.   Not sure about tube feeds   Review of Systems  Constitutional: Negative for fatigue and unexpected weight change.       Wears seat belt  HENT: Positive for dental problem and hearing loss. Negative for tinnitus.        Working with dentist  Eyes: Negative for pain and visual disturbance.  Respiratory: Negative for cough, chest tightness and shortness of breath.   Cardiovascular: Negative for chest pain, palpitations and leg swelling.  Gastrointestinal: Negative for nausea, vomiting, abdominal pain, constipation and blood in stool.       Heartburn controlled with omeprazole  Endocrine: Negative for cold intolerance and heat intolerance.  Genitourinary: Negative for urgency, frequency and difficulty urinating.       No sexual problems  Musculoskeletal: Positive for arthralgias. Negative for back pain and joint swelling.       Knee pain Hand contracture and stiffness in the morning  Skin: Negative for rash.       Warty growth recently removed Sees derm regularly  Allergic/Immunologic: Negative for environmental allergies and immunocompromised state.  Neurological: Positive for numbness. Negative for dizziness, syncope, weakness, light-headedness and headaches.       Some leg tingling  Hematological: Negative for adenopathy. Does not bruise/bleed easily.  Psychiatric/Behavioral: Negative for sleep disturbance and dysphoric mood. The patient is not nervous/anxious.        Objective:   Physical Exam  Constitutional: He is oriented to person, place, and time. He appears well-developed and well-nourished. No distress.  HENT:  Head: Normocephalic and atraumatic.    Right Ear: External ear normal.  Left Ear: External ear normal.  Mouth/Throat: Oropharynx is clear and moist. No oropharyngeal exudate.  Eyes: Conjunctivae and EOM are normal. Pupils are equal, round, and reactive to light.  Neck: Normal range of motion. Neck supple. No thyromegaly present.  Cardiovascular: Normal rate, regular rhythm and normal heart sounds.   Faint pedal pulses  Pulmonary/Chest: Effort normal and breath sounds normal. No respiratory distress. He has no wheezes. He has no rales.  Abdominal: Soft. There is no tenderness.  Musculoskeletal: He exhibits no edema and no tenderness.  Lymphadenopathy:    He has no cervical adenopathy.  Neurological: He is alert and oriented to person, place, and time.  President- "Elyn Peers, [place], [place]" [phone] [id number] Recall 2/3  Skin: No rash noted. No erythema.   No foot lesions  Psychiatric: He has a normal mood and affect. His behavior is normal.          Assessment & Plan:

## 2013-03-13 NOTE — Patient Instructions (Signed)
Diabetes Meal Planning Guide The diabetes meal planning guide is a tool to help you plan your meals and snacks. It is important for people with diabetes to manage their blood glucose (sugar) levels. Choosing the right foods and the right amounts throughout your day will help control your blood glucose. Eating right can even help you improve your blood pressure and reach or maintain a healthy weight. CARBOHYDRATE COUNTING MADE EASY When you eat carbohydrates, they turn to sugar. This raises your blood glucose level. Counting carbohydrates can help you control this level so you feel better. When you plan your meals by counting carbohydrates, you can have more flexibility in what you eat and balance your medicine with your food intake. Carbohydrate counting simply means adding up the total amount of carbohydrate grams in your meals and snacks. Try to eat about the same amount at each meal. Foods with carbohydrates are listed below. Each portion below is 1 carbohydrate serving or 15 grams of carbohydrates. Ask your dietician how many grams of carbohydrates you should eat at each meal or snack. Grains and Starches  1 slice bread.   English muffin or hotdog/hamburger bun.   cup cold cereal (unsweetened).   cup cooked pasta or rice.   cup starchy vegetables (corn, potatoes, peas, beans, winter squash).  1 tortilla (6 inches).   bagel.  1 waffle or pancake (size of a CD).   cup cooked cereal.  4 to 6 small crackers. *Whole grain is recommended. Fruit  1 cup fresh unsweetened berries, melon, papaya, pineapple.  1 small fresh fruit.   banana or mango.   cup fruit juice (4 oz unsweetened).   cup canned fruit in natural juice or water.  2 tbs dried fruit.  12 to 15 grapes or cherries. Milk and Yogurt  1 cup fat-free or 1% milk.  1 cup soy milk.  6 oz light yogurt with sugar-free sweetener.  6 oz low-fat soy yogurt.  6 oz plain yogurt. Vegetables  1 cup raw or  cup  cooked is counted as 0 carbohydrates or a "free" food.  If you eat 3 or more servings at 1 meal, count them as 1 carbohydrate serving. Other Carbohydrates   oz chips or pretzels.   cup ice cream or frozen yogurt.   cup sherbet or sorbet.  2 inch square cake, no frosting.  1 tbs honey, sugar, jam, jelly, or syrup.  2 small cookies.  3 squares of graham crackers.  3 cups popcorn.  6 crackers.  1 cup broth-based soup.  Count 1 cup casserole or other mixed foods as 2 carbohydrate servings.  Foods with less than 20 calories in a serving may be counted as 0 carbohydrates or a "free" food. You may want to purchase a book or computer software that lists the carbohydrate gram counts of different foods. In addition, the nutrition facts panel on the labels of the foods you eat are a good source of this information. The label will tell you how big the serving size is and the total number of carbohydrate grams you will be eating per serving. Divide this number by 15 to obtain the number of carbohydrate servings in a portion. Remember, 1 carbohydrate serving equals 15 grams of carbohydrate. SERVING SIZES Measuring foods and serving sizes helps you make sure you are getting the right amount of food. The list below tells how big or small some common serving sizes are.  1 oz.........4 stacked dice.  3 oz.........Deck of cards.  1 tsp........Tip   of little finger.  1 tbs........Thumb.  2 tbs........Golf ball.   cup.......Half of a fist.  1 cup........A fist. SAMPLE DIABETES MEAL PLAN Below is a sample meal plan that includes foods from the grain and starches, dairy, vegetable, fruit, and meat groups. A dietician can individualize a meal plan to fit your calorie needs and tell you the number of servings needed from each food group. However, controlling the total amount of carbohydrates in your meal or snack is more important than making sure you include all of the food groups at every  meal. You may interchange carbohydrate containing foods (dairy, starches, and fruits). The meal plan below is an example of a 2000 calorie diet using carbohydrate counting. This meal plan has 17 carbohydrate servings. Breakfast  1 cup oatmeal (2 carb servings).   cup light yogurt (1 carb serving).  1 cup blueberries (1 carb serving).   cup almonds. Snack  1 large apple (2 carb servings).  1 low-fat string cheese stick. Lunch  Chicken breast salad.  1 cup spinach.   cup chopped tomatoes.  2 oz chicken breast, sliced.  2 tbs low-fat Italian dressing.  12 whole-wheat crackers (2 carb servings).  12 to 15 grapes (1 carb serving).  1 cup low-fat milk (1 carb serving). Snack  1 cup carrots.   cup hummus (1 carb serving). Dinner  3 oz broiled salmon.  1 cup brown rice (3 carb servings). Snack  1  cups steamed broccoli (1 carb serving) drizzled with 1 tsp olive oil and lemon juice.  1 cup light pudding (2 carb servings). DIABETES MEAL PLANNING WORKSHEET Your dietician can use this worksheet to help you decide how many servings of foods and what types of foods are right for you.  BREAKFAST Food Group and Servings / Carb Servings Grain/Starches __________________________________ Dairy __________________________________________ Vegetable ______________________________________ Fruit ___________________________________________ Meat __________________________________________ Fat ____________________________________________ LUNCH Food Group and Servings / Carb Servings Grain/Starches ___________________________________ Dairy ___________________________________________ Fruit ____________________________________________ Meat ___________________________________________ Fat _____________________________________________ DINNER Food Group and Servings / Carb Servings Grain/Starches ___________________________________ Dairy  ___________________________________________ Fruit ____________________________________________ Meat ___________________________________________ Fat _____________________________________________ SNACKS Food Group and Servings / Carb Servings Grain/Starches ___________________________________ Dairy ___________________________________________ Vegetable _______________________________________ Fruit ____________________________________________ Meat ___________________________________________ Fat _____________________________________________ DAILY TOTALS Starches _________________________ Vegetable ________________________ Fruit ____________________________ Dairy ____________________________ Meat ____________________________ Fat ______________________________ Document Released: 09/23/2004 Document Revised: 03/21/2011 Document Reviewed: 08/04/2008 Exit[employer] Patient Information 2014 [employer]. Diabetes and Exercise Exercising regularly is important. It is not just about losing weight. It has many health benefits, such as:  Improving your overall fitness, flexibility, and endurance.  Increasing your bone density.  Helping with weight control.  Decreasing your body fat.  Increasing your muscle strength.  Reducing stress and tension.  Improving your overall health. People with diabetes who exercise gain additional benefits because exercise:  Reduces appetite.  Improves the body's use of blood sugar (glucose).  Helps lower or control blood glucose.  Decreases blood pressure.  Helps control blood lipids (such as cholesterol and triglycerides).  Improves the body's use of the hormone insulin by:  Increasing the body's insulin sensitivity.  Reducing the body's insulin needs.  Decreases the risk for heart disease because exercising:  Lowers cholesterol and triglycerides levels.  Increases the levels of good cholesterol (such as high-density lipoproteins [HDL]) in the  body.  Lowers blood glucose levels. YOUR ACTIVITY PLAN  Choose an activity that you enjoy and set realistic goals. Your health care provider or diabetes educator can help you make an activity plan that works for you. You   can break activities into 2 or 3 sessions throughout the day. Doing so is as good as one long session. Exercise ideas include:  Taking the dog for a walk.  Taking the stairs instead of the elevator.  Dancing to your favorite song.  Doing your favorite exercise with a friend. RECOMMENDATIONS FOR EXERCISING WITH TYPE 1 OR TYPE 2 DIABETES   Check your blood glucose before exercising. If blood glucose levels are greater than 240 mg/dL, check for urine ketones. Do not exercise if ketones are present.  Avoid injecting insulin into areas of the body that are going to be exercised. For example, avoid injecting insulin into:  The arms when playing tennis.  The legs when jogging.  Keep a record of:  Food intake before and after you exercise.  Expected peak times of insulin action.  Blood glucose levels before and after you exercise.  The type and amount of exercise you have done.  Review your records with your health care provider. Your health care provider will help you to develop guidelines for adjusting food intake and insulin amounts before and after exercising.  If you take insulin or oral hypoglycemic agents, watch for signs and symptoms of hypoglycemia. They include:  Dizziness.  Shaking.  Sweating.  Chills.  Confusion.  Drink plenty of water while you exercise to prevent dehydration or heat stroke. Body water is lost during exercise and must be replaced.  Talk to your health care provider before starting an exercise program to make sure it is safe for you. Remember, almost any type of activity is better than none. Document Released: 03/19/2003 Document Revised: 08/29/2012 Document Reviewed: 06/05/2012 [employer] Patient Information 2014 [employer].  

## 2013-03-13 NOTE — Assessment & Plan Note (Signed)
Control is suboptimal Discussed lifestyle chagnes

## 2013-03-13 NOTE — Assessment & Plan Note (Signed)
Sensory only No pain ---so no rx

## 2013-03-13 NOTE — Assessment & Plan Note (Signed)
I have personally reviewed the Medicare Annual Wellness questionnaire and have noted 1. The patient's medical and social history 2. Their use of alcohol, tobacco or illicit drugs 3. Their current medications and supplements 4. The patient's functional ability including ADL's, fall risks, home safety risks and hearing or visual             impairment. 5. Diet and physical activities 6. Evidence for depression or mood disorders  The patients weight, height, BMI and visual acuity have been recorded in the chart I have made referrals, counseling and provided education to the patient based review of the above and I have provided the pt with a written personalized care plan for preventive services.  I have provided you with a copy of your personalized plan for preventive services. Please take the time to review along with your updated medication list.  Will check fecal test Prevnar

## 2013-03-13 NOTE — Assessment & Plan Note (Signed)
Quiet on the PPI 

## 2013-03-13 NOTE — Assessment & Plan Note (Signed)
Euthyroid

## 2013-03-13 NOTE — Progress Notes (Signed)
Pre visit review using our clinic review tool, if applicable. No additional management support is needed unless otherwise documented below in the visit note. 

## 2013-03-15 ENCOUNTER — Telehealth: Payer: Self-pay

## 2013-03-15 NOTE — Telephone Encounter (Signed)
Relevant patient education assigned to patient using Emmi. ° °

## 2013-03-26 ENCOUNTER — Telehealth: Payer: Self-pay | Admitting: *Deleted

## 2013-03-26 NOTE — Telephone Encounter (Signed)
[facility] called asking how pt tests his BS, per pt 2-3 times a week.

## 2013-05-10 ENCOUNTER — Other Ambulatory Visit (INDEPENDENT_AMBULATORY_CARE_PROVIDER_SITE_OTHER): Payer: Medicare Other

## 2013-05-10 DIAGNOSIS — Z1211 Encounter for screening for malignant neoplasm of colon: Secondary | ICD-10-CM

## 2013-05-10 LAB — FECAL OCCULT BLOOD, IMMUNOCHEMICAL: FECAL OCCULT BLD: POSITIVE — AB

## 2013-05-11 ENCOUNTER — Other Ambulatory Visit: Payer: Self-pay | Admitting: Internal Medicine

## 2013-05-11 DIAGNOSIS — R195 Other fecal abnormalities: Secondary | ICD-10-CM

## 2013-05-16 ENCOUNTER — Encounter: Payer: Self-pay | Admitting: Internal Medicine

## 2013-06-25 ENCOUNTER — Other Ambulatory Visit: Payer: Self-pay

## 2013-06-25 MED ORDER — LOSARTAN POTASSIUM 100 MG PO TABS: 100.0000 mg | ORAL_TABLET | Freq: Every day | ORAL | Status: DC

## 2013-06-25 MED ORDER — LEVOTHYROXINE SODIUM 75 MCG PO TABS: 75.0000 ug | ORAL_TABLET | Freq: Every day | ORAL | Status: DC

## 2013-06-25 MED ORDER — SIMVASTATIN 80 MG PO TABS: 80.0000 mg | ORAL_TABLET | Freq: Every day | ORAL | Status: DC

## 2013-06-25 MED ORDER — OMEPRAZOLE 20 MG PO CPDR: 20.0000 mg | DELAYED_RELEASE_CAPSULE | Freq: Every day | ORAL | Status: DC

## 2013-06-25 MED ORDER — METFORMIN HCL 500 MG PO TABS: 1000.0000 mg | ORAL_TABLET | Freq: Two times a day (BID) | ORAL | Status: DC

## 2013-06-25 MED ORDER — GLIPIZIDE 5 MG PO TABS: 5.0000 mg | ORAL_TABLET | Freq: Two times a day (BID) | ORAL | Status: DC

## 2013-06-25 NOTE — Telephone Encounter (Signed)
Pt is in [place] until the weekend and pt forgot his meds; pt request 6 meds called to [facility] in [place] [phone] with quantities requested. Advised done.

## 2013-07-01 ENCOUNTER — Ambulatory Visit (AMBULATORY_SURGERY_CENTER): Payer: Medicare Other | Admitting: *Deleted

## 2013-07-01 VITALS — Ht 68.0 in | Wt 185.0 lb

## 2013-07-01 DIAGNOSIS — R195 Other fecal abnormalities: Secondary | ICD-10-CM

## 2013-07-01 MED ORDER — NA SULFATE-K SULFATE-MG SULF 17.5-3.13-1.6 GM/177ML PO SOLN: ORAL | Status: DC

## 2013-07-01 NOTE — Progress Notes (Signed)
Patient denies any allergies to eggs or soy. Patient denies any problems with anesthesia/sedation. Patient denies any oxygen use at home and does not take any diet/weight loss medications. EMMI education assisgned to patient on colonoscopy, this was explained and instructions given to patient. 

## 2013-07-08 ENCOUNTER — Encounter: Payer: Self-pay | Admitting: Internal Medicine

## 2013-07-15 ENCOUNTER — Encounter: Payer: Self-pay | Admitting: Internal Medicine

## 2013-07-15 ENCOUNTER — Ambulatory Visit (AMBULATORY_SURGERY_CENTER): Payer: Medicare Other | Admitting: Internal Medicine

## 2013-07-15 VITALS — BP 104/60 | HR 68 | Temp 98.0°F | Resp 16 | Ht 68.0 in | Wt 185.0 lb

## 2013-07-15 DIAGNOSIS — Z860100 Personal history of colon polyps, unspecified: Secondary | ICD-10-CM | POA: Insufficient documentation

## 2013-07-15 DIAGNOSIS — K648 Other hemorrhoids: Secondary | ICD-10-CM

## 2013-07-15 DIAGNOSIS — R195 Other fecal abnormalities: Secondary | ICD-10-CM

## 2013-07-15 DIAGNOSIS — D126 Benign neoplasm of colon, unspecified: Secondary | ICD-10-CM

## 2013-07-15 HISTORY — DX: Personal history of colonic polyps: Z86.010

## 2013-07-15 HISTORY — DX: Personal history of colon polyps, unspecified: Z86.0100

## 2013-07-15 LAB — GLUCOSE, CAPILLARY
Glucose-Capillary: 183 mg/dL — ABNORMAL HIGH (ref 70–99)
Glucose-Capillary: 156 mg/dL — ABNORMAL HIGH (ref 70–99)

## 2013-07-15 MED ORDER — SODIUM CHLORIDE 0.9 % IV SOLN: 500.0000 mL | INTRAVENOUS | Status: DC

## 2013-07-15 NOTE — Progress Notes (Signed)
Called to room to assist during endoscopic procedure.  Patient ID and intended procedure confirmed with present staff. Received instructions for my participation in the procedure from the performing physician.  

## 2013-07-15 NOTE — Patient Instructions (Addendum)
I found and removed 2 small polyps that look benign. You have some internal hemorrhoids that may have been the source of blood in stool (probably).  I will let you know pathology results and when to have another routine colonoscopy by mail. ? If you should do another one - think the soonest would be at 78 (routine).  I appreciate the opportunity to care for you. Gatha Mayer, MD, FACG  YOU HAD AN ENDOSCOPIC PROCEDURE TODAY AT [facility]: Refer to the procedure report that was given to you for any specific questions about what was found during the examination.  If the procedure report does not answer your questions, please call your gastroenterologist to clarify.  If you requested that your care partner not be given the details of your procedure findings, then the procedure report has been included in a sealed envelope for you to review at your convenience later.  YOU SHOULD EXPECT: Some feelings of bloating in the abdomen. Passage of more gas than usual.  Walking can help get rid of the air that was put into your GI tract during the procedure and reduce the bloating. If you had a lower endoscopy (such as a colonoscopy or flexible sigmoidoscopy) you may notice spotting of blood in your stool or on the toilet paper. If you underwent a bowel prep for your procedure, then you may not have a normal bowel movement for a few days.  DIET: Your first meal following the procedure should be a light meal and then it is ok to progress to your normal diet.  A half-sandwich or bowl of soup is an example of a good first meal.  Heavy or fried foods are harder to digest and may make you feel nauseous or bloated.  Likewise meals heavy in dairy and vegetables can cause extra gas to form and this can also increase the bloating.  Drink plenty of fluids but you should avoid alcoholic beverages for 24 hours.  ACTIVITY: Your care partner should take you home directly after the procedure.  You should plan  to take it easy, moving slowly for the rest of the day.  You can resume normal activity the day after the procedure however you should NOT DRIVE or use heavy machinery for 24 hours (because of the sedation medicines used during the test).    SYMPTOMS TO REPORT IMMEDIATELY: A gastroenterologist can be reached at any hour.  During normal business hours, 8:30 AM to 5:00 PM Monday through Friday, call [phone].  After hours and on weekends, please call the GI answering service at [phone] who will take a message and have the physician on call contact you.   Following lower endoscopy (colonoscopy or flexible sigmoidoscopy):  Excessive amounts of blood in the stool  Significant tenderness or worsening of abdominal pains  Swelling of the abdomen that is new, acute  Fever of 100F or higher  FOLLOW UP: If any biopsies were taken you will be contacted by phone or by letter within the next 1-3 weeks.  Call your gastroenterologist if you have not heard about the biopsies in 3 weeks.  Our staff will call the home number listed on your records the next business day following your procedure to check on you and address any questions or concerns that you may have at that time regarding the information given to you following your procedure. This is a courtesy call and so if there is no answer at the home number and we have  not heard from you through the emergency physician on call, we will assume that you have returned to your regular daily activities without incident.  Please, all of the handouts given to you by your recovery room nurse.  SIGNATURES/CONFIDENTIALITY: You and/or your care partner have signed paperwork which will be entered into your electronic medical record.  These signatures attest to the fact that that the information above on your After Visit Summary has been reviewed and is understood.  Full responsibility of the confidentiality of this discharge information lies with you and/or  your care-partner.

## 2013-07-15 NOTE — Op Note (Signed)
[place]  [address] [place], [id number]   COLONOSCOPY PROCEDURE REPORT  PATIENT: Sean Koch, Sean Koch  MR#: [id number] BIRTHDATE: 25-Oct-1940 , 63  yrs. old GENDER: Male ENDOSCOPIST: Gatha Mayer, MD, [facility] REFERRED DV:[id number] Silvio Pate, M.D. PROCEDURE DATE:  07/15/2013 PROCEDURE:   Colonoscopy, diagnostic First Screening Colonoscopy - Avg.  risk and is 50 yrs.  old or older - No.  Prior Negative Screening - Now for repeat screening. N/A  History of Adenoma - Now for follow-up colonoscopy & has been > or = to 3 yrs.  N/A  Polyps Removed Today? Yes. ASA CLASS:   Class III INDICATIONS:heme-positive stool. MEDICATIONS: Propofol (Diprivan) 240 mg IV, MAC sedation, administered by CRNA, and These medications were titrated to patient response per physician's verbal order  DESCRIPTION OF PROCEDURE:   After the risks benefits and alternatives of the procedure were thoroughly explained, informed consent was obtained.  A digital rectal exam revealed no abnormalities of the rectum, A digital rectal exam revealed no prostatic nodules, and A digital rectal exam revealed the prostate was not enlarged.   The LB CF-H180AL Loaner [id number]  endoscope was introduced through the anus and advanced to the cecum, which was identified by both the appendix and ileocecal valve. No adverse events experienced.   The quality of the prep was excellent using Suprep  The instrument was then slowly withdrawn as the colon was fully examined.  COLON FINDINGS: Two sessile polyps measuring 4 and 6 mm in size were found at the splenic flexure and in the descending colon.  A polypectomy was performed with a cold snare.  The resection was complete and the polyp tissue was completely retrieved.   The colon mucosa was otherwise normal.   A right colon retroflexion was performed.  Retroflexed views revealed no abnormalities. The time to cecum=1 minutes 17 seconds.  Withdrawal time=9 minutes  06 seconds.  The scope was withdrawn and the procedure completed. COMPLICATIONS: There were no complications.  ENDOSCOPIC IMPRESSION: 1.   Two sessile polyps measuring 4 and 6 mm in size were found at the splenic flexure and in the descending colon; polypectomy was performed with a cold snare 2.   The colon mucosa was otherwise normal  RECOMMENDATIONS: 1.  Timing of repeat colonoscopy will be determined by pathology findings. 2.   Will consider but at 44 and with 2 small polyps may not need another routine colonoscopy.   eSigned:  Gatha Mayer, MD, [facility] 07/15/2013 9:34 AM   cc: Venia Carbon, MD and The Patient

## 2013-07-15 NOTE — Progress Notes (Signed)
Report to PACU, RN, vss, BBS= Clear.  

## 2013-07-16 ENCOUNTER — Telehealth: Payer: Self-pay | Admitting: *Deleted

## 2013-07-16 NOTE — Telephone Encounter (Signed)
  Follow up Call-  Call back number 07/15/2013  Post procedure Call Back phone  # [phone]  Permission to leave phone message Yes     Patient questions:  Do you have a fever, pain , or abdominal swelling? No. Pain Score  0 *  Have you tolerated food without any problems? Yes.    Have you been able to return to your normal activities? Yes.    Do you have any questions about your discharge instructions: Diet   No. Medications  No. Follow up visit  No.  Do you have questions or concerns about your Care? No.  Actions: * If pain score is 4 or above: No action needed, pain <4.  Information provided via wife.

## 2013-07-21 ENCOUNTER — Encounter: Payer: Self-pay | Admitting: Internal Medicine

## 2013-07-21 NOTE — Progress Notes (Signed)
Quick Note:  2 small adenomas - will not recommend routine repeat colonoscopy ______

## 2013-09-02 ENCOUNTER — Other Ambulatory Visit: Payer: Medicare Other

## 2013-09-10 ENCOUNTER — Ambulatory Visit (INDEPENDENT_AMBULATORY_CARE_PROVIDER_SITE_OTHER): Payer: Medicare Other | Admitting: Internal Medicine

## 2013-09-10 ENCOUNTER — Encounter: Payer: Self-pay | Admitting: Internal Medicine

## 2013-09-10 VITALS — BP 120/60 | HR 73 | Temp 97.5°F | Wt 180.0 lb

## 2013-09-10 DIAGNOSIS — Z23 Encounter for immunization: Secondary | ICD-10-CM

## 2013-09-10 DIAGNOSIS — E1142 Type 2 diabetes mellitus with diabetic polyneuropathy: Secondary | ICD-10-CM

## 2013-09-10 DIAGNOSIS — I10 Essential (primary) hypertension: Secondary | ICD-10-CM

## 2013-09-10 DIAGNOSIS — M19049 Primary osteoarthritis, unspecified hand: Secondary | ICD-10-CM

## 2013-09-10 DIAGNOSIS — E1149 Type 2 diabetes mellitus with other diabetic neurological complication: Secondary | ICD-10-CM

## 2013-09-10 LAB — SEDIMENTATION RATE: Sed Rate: 30 mm/hr — ABNORMAL HIGH (ref 0–22)

## 2013-09-10 LAB — CBC WITH DIFFERENTIAL/PLATELET
Basophils Absolute: 0 10*3/uL (ref 0.0–0.1)
Basophils Relative: 0.3 % (ref 0.0–3.0)
Eosinophils Absolute: 0.3 10*3/uL (ref 0.0–0.7)
Eosinophils Relative: 3.2 % (ref 0.0–5.0)
HCT: 43.8 % (ref 39.0–52.0)
Hemoglobin: 14.5 g/dL (ref 13.0–17.0)
Lymphocytes Relative: 13.8 % (ref 12.0–46.0)
Lymphs Abs: 1.4 10*3/uL (ref 0.7–4.0)
MCHC: 33.1 g/dL (ref 30.0–36.0)
MCV: 92.6 fl (ref 78.0–100.0)
Monocytes Absolute: 0.5 10*3/uL (ref 0.1–1.0)
Monocytes Relative: 5.1 % (ref 3.0–12.0)
Neutro Abs: 7.9 10*3/uL — ABNORMAL HIGH (ref 1.4–7.7)
Neutrophils Relative %: 77.6 % — ABNORMAL HIGH (ref 43.0–77.0)
Platelets: 241 10*3/uL (ref 150.0–400.0)
RBC: 4.73 Mil/uL (ref 4.22–5.81)
RDW: 13.4 % (ref 11.5–15.5)
WBC: 10.2 10*3/uL (ref 4.0–10.5)

## 2013-09-10 LAB — HM DIABETES FOOT EXAM

## 2013-09-10 LAB — COMPREHENSIVE METABOLIC PANEL
ALT: 9 U/L (ref 0–53)
AST: 14 U/L (ref 0–37)
Albumin: 3.6 g/dL (ref 3.5–5.2)
Alkaline Phosphatase: 64 U/L (ref 39–117)
BUN: 15 mg/dL (ref 6–23)
CALCIUM: 9.3 mg/dL (ref 8.4–10.5)
CHLORIDE: 104 meq/L (ref 96–112)
CO2: 30 meq/L (ref 19–32)
Creatinine, Ser: 1 mg/dL (ref 0.4–1.5)
GFR: 77.34 mL/min (ref >60.00)
Glucose, Bld: 126 mg/dL — ABNORMAL HIGH (ref 70–99)
Potassium: 4.2 mEq/L (ref 3.5–5.1)
SODIUM: 139 meq/L (ref 135–145)
TOTAL PROTEIN: 7 g/dL (ref 6.0–8.3)
Total Bilirubin: 0.8 mg/dL (ref 0.2–1.2)

## 2013-09-10 LAB — C-REACTIVE PROTEIN: CRP: 0.8 mg/dL (ref 0.5–20.0)

## 2013-09-10 LAB — HEMOGLOBIN A1C: HEMOGLOBIN A1C: 8.4 % — AB (ref 4.6–6.5)

## 2013-09-10 NOTE — Addendum Note (Signed)
Addended by: Despina Hidden on: 09/10/2013 11:16 AM   Modules accepted: Orders

## 2013-09-10 NOTE — Progress Notes (Signed)
Subjective:    Patient ID: Sean Koch, male    DOB: Mar 16, 1940, 73 y.o.   MRN: [id number]  HPI Not doing well Having trouble with his hands swollen and painful Started about a month ago Aleve once a day helps--before he wants to use his hands No other joint swelling No fevers Sister has RA--was very severe  Also has pain in left cheek?TMJ No teeth problems per dentist Hurts to open mouth  Having some sharp pains in left knee No injury No swelling This is not new  Checking sugars randomly 150-190 non fasting Fasting usually 130's Mild low sugar feelings--but sugars not low Mild foot tingling, no pain  No chest pain Stable exercise tolerance--- does get DOE walking up steep hill (no real change) No dizziness or syncope  Current Outpatient Prescriptions on File Prior to Visit  Medication Sig Dispense Refill  . aspirin EC 81 MG tablet Take 81 mg by mouth daily.      Marland Kitchen glipiZIDE (GLUCOTROL) 5 MG tablet Take 1 tablet (5 mg total) by mouth 2 (two) times daily before a meal.  8 tablet  0  . glucose blood (ONE TOUCH ULTRA TEST) test strip Use as instructed, to test 2-3 times weekly dx: 250.40  100 each  3  . levothyroxine (SYNTHROID, LEVOTHROID) 75 MCG tablet Take 1 tablet (75 mcg total) by mouth daily.  4 tablet  0  . losartan (COZAAR) 100 MG tablet Take 1 tablet (100 mg total) by mouth daily.  4 tablet  0  . metFORMIN (GLUCOPHAGE) 500 MG tablet Take 2 tablets (1,000 mg total) by mouth 2 (two) times daily with a meal.  16 tablet  0  . omeprazole (PRILOSEC) 20 MG capsule Take 1 capsule (20 mg total) by mouth daily.  4 capsule  0  . ONE TOUCH LANCETS MISC Use to test blood sugar 2-3 times weekly dx: 250.40  100 each  3  . simvastatin (ZOCOR) 80 MG tablet Take 1 tablet (80 mg total) by mouth at bedtime.  4 tablet  0   No current facility-administered medications on file prior to visit.    Allergies  Allergen Reactions  . Atorvastatin     REACTION: Joint aches     Past Medical History  Diagnosis Date  . NIDDM, uncontrolled, with neuropathy   . Hyperlipidemia   . Thyroid disease   . Melanoma   . GERD (gastroesophageal reflux disease)   . ED (erectile dysfunction)   . History of nephrolithiasis   . Carpal tunnel syndrome of right wrist   . Renal disorder     kidney stones  . Cataract   . Personal history of colonic polyps - 2 small adenomas 07/15/2013    Past Surgical History  Procedure Laterality Date  . Pilonidal cyst / sinus excision    . Knee arthroscopy    . Rotator cuff repair    . Toe surgery    . Cholecystectomy    . Cataract extraction w/ intraocular lens implant Bilateral 11/14 & 2/15  . Melanoma excision      upper right arm    Family History  Problem Relation Age of Onset  . Heart disease Mother   . Diabetes Mother   . Heart disease Brother   . Cancer Neg Hx   . Colon cancer Neg Hx     History   Social History  . Marital Status: Married    Spouse Name: N/A    Number of Children: 2  .  Years of Education: N/A   Occupational History  . retired Geneticist, molecular    Social History Main Topics  . Smoking status: Never Smoker   . Smokeless tobacco: Never Used  . Alcohol Use: Yes     Comment: 1 glass wine monthly per pt.  . Drug Use: No  . Sexual Activity: Not on file   Other Topics Concern  . Not on file   Social History Narrative   Has living will    Wife, then son Rylan, would be health care POA   Would accept resuscitation. No prolonged artificial ventilation.   Not sure about tube feeds   Review of Systems Has been more careful with his eating Weight is down 5# Going to gym regularly No headaches No change in vision    Objective:   Physical Exam  Constitutional: He appears well-developed and well-nourished. No distress.  HENT:  TMs and canals normal Mild TMJ tenderness with chewing  Neck: Normal range of motion. Neck supple. No thyromegaly present.  Cardiovascular: Normal rate, regular rhythm  and normal heart sounds.  Exam reveals no gallop.   No murmur heard. Faint pedal pulses  Pulmonary/Chest: Effort normal and breath sounds normal. No respiratory distress. He has no wheezes. He has no rales.  Musculoskeletal: He exhibits no edema.  Mild synovitis in PIPs 2-5 both hands. Not overly tender Left wrist tender along ulnar side--but no clear synovitis there  Lymphadenopathy:    He has no cervical adenopathy.  Skin:  No foot lesions  Psychiatric: He has a normal mood and affect. His behavior is normal.          Assessment & Plan:

## 2013-09-10 NOTE — Assessment & Plan Note (Signed)
Hopefully better control No new meds unless >>8.5%

## 2013-09-10 NOTE — Patient Instructions (Signed)
Please call if your hands get worse. You can continue the aleve once a day (and twice a day on a bad day if needed).

## 2013-09-10 NOTE — Assessment & Plan Note (Signed)
Mild symptoms without sig pain

## 2013-09-10 NOTE — Assessment & Plan Note (Signed)
BP Readings from Last 3 Encounters:  09/10/13 120/60  07/15/13 104/60  03/13/13 100/60   Good control

## 2013-09-10 NOTE — Progress Notes (Signed)
Pre visit review using our clinic review tool, if applicable. No additional management support is needed unless otherwise documented below in the visit note. 

## 2013-09-10 NOTE — Assessment & Plan Note (Signed)
Symmetric in PIPs Left wrist seems to be more tendonopathy Will check serology as fair risk RA Mild symptoms--continue aleve for now Rheumatology if worsens

## 2013-09-11 LAB — ANA: Anti Nuclear Antibody(ANA): NEGATIVE

## 2013-09-11 LAB — RHEUMATOID FACTOR

## 2013-09-26 ENCOUNTER — Encounter: Payer: Self-pay | Admitting: Internal Medicine

## 2013-09-26 ENCOUNTER — Telehealth: Payer: Self-pay

## 2013-09-26 ENCOUNTER — Ambulatory Visit (INDEPENDENT_AMBULATORY_CARE_PROVIDER_SITE_OTHER): Payer: Medicare Other | Admitting: Internal Medicine

## 2013-09-26 VITALS — BP 108/68 | HR 73 | Temp 98.4°F | Wt 176.0 lb

## 2013-09-26 DIAGNOSIS — M19049 Primary osteoarthritis, unspecified hand: Secondary | ICD-10-CM

## 2013-09-26 DIAGNOSIS — H8110 Benign paroxysmal vertigo, unspecified ear: Secondary | ICD-10-CM | POA: Insufficient documentation

## 2013-09-26 MED ORDER — MECLIZINE HCL 25 MG PO TABS: 25.0000 mg | ORAL_TABLET | Freq: Three times a day (TID) | ORAL | Status: DC | PRN

## 2013-09-26 NOTE — Telephone Encounter (Signed)
Patient Information:  Caller Name: [name]  Phone: [phone]  Patient: Sean Koch, Sean Koch  Gender: Male  DOB: Jan 27, 1940  Age: 73 Years  PCP: Viviana Simpler [facility])  Office Follow Up:  Does the office need to follow up with this patient?: No  Instructions For The Office: N/A  RN Note:  Pt is calling with dizziness, lightheaded and spinning sensation present for 1 week and has vomited 2 times x 1 week.  Pt also reports (and the purpose in calling) is that his hands, wrist and arms are still painful.   Pt explains that wrist are swelling.  The dizziness is decribed as moderate, he has to hold on to something when standing but has not passed out or fainted.  Pt declined a office now appt stating that he wants to see PCP, next appt with PCP today is 2:30.  Rena in office contacted and advised OK to wait but to call back with any changes in condition.  Pt does agree and has a driver to office.  Symptoms  Reason For Call & Symptoms: bilateral hand, wrist and arm pain, swelling  Reviewed Health History In EMR: Yes  Reviewed Medications In EMR: Yes  Reviewed Allergies In EMR: Yes  Reviewed Surgeries / Procedures: Yes  Date of Onset of Symptoms: 09/10/2013  Guideline(s) Used:  Arm Pain  Dizziness  Disposition Per Guideline:   Go to Office Now  Reason For Disposition Reached:   Spinning or tilting sensation (vertigo) present now  Advice Given:  N/A  Patient Refused Recommendation:  Patient Refused Appt, Patient Requests Appt At Later Date  Pt refused office now appt and states that he wants to see PCP at 2:30

## 2013-09-26 NOTE — Patient Instructions (Addendum)
Please take the meclizine 25mg  three times a day. When your symptoms seem better, you can slowly wean off of this.  Benign Positional Vertigo Vertigo means you feel like you or your surroundings are moving when they are not. Benign positional vertigo is the most common form of vertigo. Benign means that the cause of your condition is not serious. Benign positional vertigo is more common in older adults. CAUSES  Benign positional vertigo is the result of an upset in the labyrinth system. This is an area in the middle ear that helps control your balance. This may be caused by a viral infection, head injury, or repetitive motion. However, often no specific cause is found. SYMPTOMS  Symptoms of benign positional vertigo occur when you move your head or eyes in different directions. Some of the symptoms may include:  Loss of balance and falls.  Vomiting.  Blurred vision.  Dizziness.  Nausea.  Involuntary eye movements (nystagmus). DIAGNOSIS  Benign positional vertigo is usually diagnosed by physical exam. If the specific cause of your benign positional vertigo is unknown, your caregiver may perform imaging tests, such as magnetic resonance imaging (MRI) or computed tomography (CT). TREATMENT  Your caregiver may recommend movements or procedures to correct the benign positional vertigo. Medicines such as meclizine, benzodiazepines, and medicines for nausea may be used to treat your symptoms. In rare cases, if your symptoms are caused by certain conditions that affect the inner ear, you may need surgery. HOME CARE INSTRUCTIONS   Follow your caregiver's instructions.  Move slowly. Do not make sudden body or head movements.  Avoid driving.  Avoid operating heavy machinery.  Avoid performing any tasks that would be dangerous to you or others during a vertigo episode.  Drink enough fluids to keep your urine clear or pale yellow. SEEK IMMEDIATE MEDICAL CARE IF:   You develop problems with  walking, weakness, numbness, or using your arms, hands, or legs.  You have difficulty speaking.  You develop severe headaches.  Your nausea or vomiting continues or gets worse.  You develop visual changes.  Your family or friends notice any behavioral changes.  Your condition gets worse.  You have a fever.  You develop a stiff neck or sensitivity to light. MAKE SURE YOU:   Understand these instructions.  Will watch your condition.  Will get help right away if you are not doing well or get worse. Document Released: 10/04/2005 Document Revised: 03/21/2011 Document Reviewed: 09/16/2010 [facility] Patient Information 2015 [place], [place]. This information is not intended to replace advice given to you by your health care provider. Make sure you discuss any questions you have with your health care provider.

## 2013-09-26 NOTE — Assessment & Plan Note (Signed)
Nystagmus bilaterally so not sure Epley maneuver will help No worrisome neuro findings to suggest brain stem stroke Reassured Will try meclizine ENT if persists

## 2013-09-26 NOTE — Assessment & Plan Note (Signed)
Still with synovitis ?seronegative RA or other rheum disorder Will refer for rheumatology

## 2013-09-26 NOTE — Progress Notes (Signed)
Subjective:    Patient ID: Sean Koch, male    DOB: [birth year], 73 y.o.   MRN: [id number]  HPI Here his wife Ola  Dizziness started since his last visit Vomited twice in 1 day but has had nausea Lightheaded feeling with some sense of movement (has to hold on to keep from falling) Some symptoms when turning over in bed Dizziness is movement related  No hearing loss  No tinnitus No fever  No diplopia or unilateral vision loss No facial droop No aphasia or dysphagia No focal weakness  Hands still hurt Pain now goes up his right arm Aleve in am may help some  Current Outpatient Prescriptions on File Prior to Visit  Medication Sig Dispense Refill  . aspirin EC 81 MG tablet Take 81 mg by mouth daily.      Marland Kitchen glipiZIDE (GLUCOTROL) 5 MG tablet Take 1 tablet (5 mg total) by mouth 2 (two) times daily before a meal.  8 tablet  0  . glucose blood (ONE TOUCH ULTRA TEST) test strip Use as instructed, to test 2-3 times weekly dx: 250.40  100 each  3  . levothyroxine (SYNTHROID, LEVOTHROID) 75 MCG tablet Take 1 tablet (75 mcg total) by mouth daily.  4 tablet  0  . losartan (COZAAR) 100 MG tablet Take 1 tablet (100 mg total) by mouth daily.  4 tablet  0  . metFORMIN (GLUCOPHAGE) 500 MG tablet Take 2 tablets (1,000 mg total) by mouth 2 (two) times daily with a meal.  16 tablet  0  . omeprazole (PRILOSEC) 20 MG capsule Take 1 capsule (20 mg total) by mouth daily.  4 capsule  0  . ONE TOUCH LANCETS MISC Use to test blood sugar 2-3 times weekly dx: 250.40  100 each  3  . simvastatin (ZOCOR) 80 MG tablet Take 1 tablet (80 mg total) by mouth at bedtime.  4 tablet  0   No current facility-administered medications on file prior to visit.    Allergies  Allergen Reactions  . Atorvastatin     REACTION: Joint aches    Past Medical History  Diagnosis Date  . NIDDM, uncontrolled, with neuropathy   . Hyperlipidemia   . Thyroid disease   . Melanoma   . GERD (gastroesophageal reflux  disease)   . ED (erectile dysfunction)   . History of nephrolithiasis   . Carpal tunnel syndrome of right wrist   . Renal disorder     kidney stones  . Cataract   . Personal history of colonic polyps - 2 small adenomas 07/15/2013    Past Surgical History  Procedure Laterality Date  . Pilonidal cyst / sinus excision    . Knee arthroscopy    . Rotator cuff repair    . Toe surgery    . Cholecystectomy    . Cataract extraction w/ intraocular lens implant Bilateral 11/14 & 2/15  . Melanoma excision      upper right arm    Family History  Problem Relation Age of Onset  . Heart disease Mother   . Diabetes Mother   . Heart disease Brother   . Cancer Neg Hx   . Colon cancer Neg Hx     History   Social History  . Marital Status: Married    Spouse Name: N/A    Number of Children: 2  . Years of Education: N/A   Occupational History  . retired Geneticist, molecular    Social History Main Topics  . Smoking status:  Never Smoker   . Smokeless tobacco: Never Used  . Alcohol Use: Yes     Comment: 1 glass wine monthly per pt.  . Drug Use: No  . Sexual Activity: Not on file   Other Topics Concern  . Not on file   Social History Narrative   Has living will    Wife, then son Brok, would be health care POA   Would accept resuscitation. No prolonged artificial ventilation.   Not sure about tube feeds   Review of Systems Sugars have been high at times-- highest 250. Not sure if this is related Now has some pain in TMJs when he opens his mouth wide    Objective:   Physical Exam  Constitutional: He is oriented to person, place, and time. He appears well-developed and well-nourished. No distress.  HENT:  Mouth/Throat: Oropharynx is clear and moist. No oropharyngeal exudate.  Eyes: Conjunctivae and EOM are normal. Pupils are equal, round, and reactive to light.  Fundi benign Horizontal nystagmus on lateral gaze bilaterally  Neck: Normal range of motion. Neck supple. No thyromegaly  present.  Musculoskeletal:  Still has sig synovitis in left wrist and less so on right (and PIPs also)  Lymphadenopathy:    He has no cervical adenopathy.  Neurological: He is alert and oriented to person, place, and time. He has normal strength. He displays no tremor. No cranial nerve deficit. He exhibits normal muscle tone. He displays a negative Romberg sign. Coordination and gait normal.          Assessment & Plan:

## 2013-09-26 NOTE — Telephone Encounter (Signed)
Amy with [facility] said pt was seen 09/10/13 with hand pain and now pain going up arm from hand, also dizziness worse when standing; pt has vomited x 2 in last week; no vomiting today. [facility] disposition is seen in office this AM; pt wanted to wait to see Dr Silvio Pate today at 2:30, Amy scheduled appt and if pt condition changes or worsens prior to appt pt will call office back.

## 2013-09-26 NOTE — Progress Notes (Signed)
Pre visit review using our clinic review tool, if applicable. No additional management support is needed unless otherwise documented below in the visit note. 

## 2013-09-26 NOTE — Telephone Encounter (Signed)
Will evaluate at [facility] 

## 2013-10-23 ENCOUNTER — Telehealth: Payer: Self-pay

## 2013-10-23 NOTE — Telephone Encounter (Signed)
Pt states he has had an eye exam within the last year.  Last appointment was at [facility].  Called office for copy of report. Awaiting fax.

## 2013-10-23 NOTE — Telephone Encounter (Signed)
Fax received

## 2013-11-14 ENCOUNTER — Other Ambulatory Visit: Payer: Self-pay | Admitting: Dermatology

## 2013-12-16 ENCOUNTER — Telehealth: Payer: Self-pay

## 2013-12-16 NOTE — Telephone Encounter (Signed)
PLEASE NOTE: All timestamps contained within this report are represented as [place] Standard Time. CONFIDENTIALTY NOTICE: This fax transmission is intended only for the addressee. It contains information that is legally privileged, confidential or otherwise protected from use or disclosure. If you are not the intended recipient, you are strictly prohibited from reviewing, disclosing, copying using or disseminating any of this information or taking any action in reliance on or regarding this information. If you have received this fax in error, please notify us immediately by telephone so that we can arrange for its return to [place]. Phone: [phone], Toll-Free: [phone], Fax: [phone] Page: 1 of 2 Call Id: [id number] [place] Patient Name: Sean Koch Gender: Male DOB: [birth year] Age: 73 Y 50 M 7 D Return Phone Number: [phone] (Primary) Address: [address] City/State/Zip: [place] [address] Client [facility] - Client Client Site [facility] - Day Physician Viviana Simpler Contact Type Call Call Type Triage / Clinical Relationship To Patient Self Return Phone Number [phone] (Primary) Chief Complaint Skin Lesion - Moles/ Lumps/ Growths Initial Comment Caller states has a cyst seeping fluid. PreDisposition Call Doctor Nurse Assessment Nurse: Marcelline Deist, RN, Kermit Balo Date/Time Eilene Ghazi Time): 12/16/2013 12:11:46 PM Confirm and document reason for call. If symptomatic, describe symptoms. ---Caller states has a cyst seeping fluid/blood tinged - called it a "polynidal" cyst at end of spine. Had it 50 years ago when in [employer]. Had it removed years after that. Now, that area has become irritated from chair that he uses. Cyst started a few days back, larger than a pea, smaller than a quarter. No fever. Has the patient  traveled out of the country within the last 30 days? ---Not Applicable Does the patient require triage? ---Yes Related visit to physician within the last 2 weeks? ---No Does the PT have any chronic conditions? (i.e. diabetes, asthma, etc.) ---Yes List chronic conditions. ---diabetes Guidelines Guideline Title Affirmed Question Affirmed Notes Nurse Date/Time (Eastern Time) Skin Lump or Localized Swelling Looks like a boil, infected sore, deep ulcer or other infected rash Marcelline Deist, RN, Kermit Balo 12/16/2013 12:16:10 PM Disp. Time Eilene Ghazi Time) Disposition Final User 12/16/2013 12:21:59 PM See Physician within 24 Hours Yes Marcelline Deist, RN, Milana Kidney Understands: Yes Disagree/Comply: Comply PLEASE NOTE: All timestamps contained within this report are represented as [place] Standard Time. CONFIDENTIALTY NOTICE: This fax transmission is intended only for the addressee. It contains information that is legally privileged, confidential or otherwise protected from use or disclosure. If you are not the intended recipient, you are strictly prohibited from reviewing, disclosing, copying using or disseminating any of this information or taking any action in reliance on or regarding this information. If you have received this fax in error, please notify us immediately by telephone so that we can arrange for its return to [place]. Phone: [phone], Toll-Free: [phone], Fax: [phone] Page: 2 of 2 Call Id: [id number] Care Advice Given Per Guideline SEE PHYSICIAN WITHIN 24 HOURS: * IF OFFICE WILL BE OPEN: You need to be examined within the next 24 hours. Call your doctor when the office opens, and make an appointment. CLEANSING: Wash the infected area with warm water and an antibacterial soap 3 times per day. ANTIBIOTIC OINTMENT: Apply antibiotic ointment (OTC) to the infected area 3 times per day. CALL BACK IF: * You become worse. * Severe pain occurs * Fever occurs CARE ADVICE given per Skin Swelling  or  Lump (Adult) guideline. After Care Instructions Given Call Event Type User Date / Time Description Comments User: Donald Siva, RN Date/Time Eilene Ghazi Time): 12/16/2013 12:22:58 PM Pt. has diabetes, concerned about this cyst becoming larger, infected. Triaged for 24 hr. outcome & warm transferred to scheduler for appt. Referrals REFERRED TO PCP OFFICE

## 2013-12-16 NOTE — Telephone Encounter (Signed)
Dr. Silvio Pate pt

## 2013-12-16 NOTE — Telephone Encounter (Signed)
Pt has appt with Sean Silversmith NP on 12/17/13 at 9:30am.

## 2013-12-17 ENCOUNTER — Encounter: Payer: Self-pay | Admitting: Internal Medicine

## 2013-12-17 ENCOUNTER — Ambulatory Visit (INDEPENDENT_AMBULATORY_CARE_PROVIDER_SITE_OTHER): Payer: Medicare Other | Admitting: Internal Medicine

## 2013-12-17 VITALS — BP 124/58 | HR 86 | Temp 98.2°F | Wt 178.0 lb

## 2013-12-17 DIAGNOSIS — L03317 Cellulitis of buttock: Secondary | ICD-10-CM

## 2013-12-17 MED ORDER — SULFAMETHOXAZOLE-TRIMETHOPRIM 800-160 MG PO TABS: 1.0000 | ORAL_TABLET | Freq: Two times a day (BID) | ORAL | Status: DC

## 2013-12-17 MED ORDER — AMOXICILLIN-POT CLAVULANATE 875-125 MG PO TABS: 1.0000 | ORAL_TABLET | Freq: Two times a day (BID) | ORAL | Status: DC

## 2013-12-17 NOTE — Progress Notes (Signed)
Subjective:    Patient ID: Sean Koch, male    DOB: [birth year], 73 y.o.   MRN: [id number]  HPI  Pt presents to the clinic today with c/o a cyst in between his buttocks. He noticed this 1 week ago. He has noticed that it has drained pus and blood. He denies fever, chills or body aches. He has not tried anything OTC. He does have a history of DM2. He also reports he had a pilonidal cyst 50 years ago that was surgically removed.  Review of Systems      Past Medical History  Diagnosis Date  . NIDDM, uncontrolled, with neuropathy   . Hyperlipidemia   . Thyroid disease   . Melanoma   . GERD (gastroesophageal reflux disease)   . ED (erectile dysfunction)   . History of nephrolithiasis   . Carpal tunnel syndrome of right wrist   . Renal disorder     kidney stones  . Cataract   . Personal history of colonic polyps - 2 small adenomas 07/15/2013    Current Outpatient Prescriptions  Medication Sig Dispense Refill  . aspirin EC 81 MG tablet Take 81 mg by mouth daily.    . folic acid (FOLVITE) 1 MG tablet   3  . glipiZIDE (GLUCOTROL) 5 MG tablet Take 1 tablet (5 mg total) by mouth 2 (two) times daily before a meal. 8 tablet 0  . glucose blood (ONE TOUCH ULTRA TEST) test strip Use as instructed, to test 2-3 times weekly dx: 250.40 100 each 3  . levothyroxine (SYNTHROID, LEVOTHROID) 75 MCG tablet Take 1 tablet (75 mcg total) by mouth daily. 4 tablet 0  . losartan (COZAAR) 100 MG tablet Take 1 tablet (100 mg total) by mouth daily. 4 tablet 0  . metFORMIN (GLUCOPHAGE) 500 MG tablet Take 2 tablets (1,000 mg total) by mouth 2 (two) times daily with a meal. 16 tablet 0  . methotrexate (RHEUMATREX) 2.5 MG tablet Take 7.5 mg by mouth 2 (two) times daily. On Tuesdays only    . omeprazole (PRILOSEC) 20 MG capsule Take 1 capsule (20 mg total) by mouth daily. 4 capsule 0  . ONE TOUCH LANCETS MISC Use to test blood sugar 2-3 times weekly dx: 250.40 100 each 3  . simvastatin (ZOCOR) 80 MG tablet  Take 1 tablet (80 mg total) by mouth at bedtime. 4 tablet 0   No current facility-administered medications for this visit.    Allergies  Allergen Reactions  . Atorvastatin     REACTION: Joint aches    Family History  Problem Relation Age of Onset  . Heart disease Mother   . Diabetes Mother   . Heart disease Brother   . Cancer Neg Hx   . Colon cancer Neg Hx     History   Social History  . Marital Status: Married    Spouse Name: N/A    Number of Children: 2  . Years of Education: N/A   Occupational History  . retired Geneticist, molecular    Social History Main Topics  . Smoking status: Never Smoker   . Smokeless tobacco: Never Used  . Alcohol Use: Yes     Comment: 1 glass wine monthly per pt.  . Drug Use: No  . Sexual Activity: Not on file   Other Topics Concern  . Not on file   Social History Narrative   Has living will    Wife, then son Brandley, would be health care POA   Would accept resuscitation. No  prolonged artificial ventilation.   Not sure about tube feeds     Constitutional: Denies fever, malaise, fatigue, headache or abrupt weight changes.  Skin: Pt reports cyst. Denies  rashes or ulcercations.    No other specific complaints in a complete review of systems (except as listed in HPI above).  Objective:   Physical Exam  BP 124/58 mmHg  Pulse 86  Temp(Src) 98.2 F (36.8 C) (Oral)  Wt 178 lb (80.74 kg)  SpO2 97% Wt Readings from Last 3 Encounters:  12/17/13 178 lb (80.74 kg)  09/26/13 176 lb (79.833 kg)  09/10/13 180 lb (81.647 kg)    General: Appears his stated age, well developed, well nourished in NAD. Skin: Excoriation noted in the gluteal fold just below his pilonidal scar. No cyst or abscess identified. Surrounding cellulitis noted. Cardiovascular: Normal rate and rhythm. S1,S2 noted.  No murmur, rubs or gallops noted.  Pulmonary/Chest: Normal effort and positive vesicular breath sounds. No respiratory distress. No wheezes, rales or ronchi  noted.   BMET    Component Value Date/Time   NA 139 09/10/2013 0956   K 4.2 09/10/2013 0956   CL 104 09/10/2013 0956   CO2 30 09/10/2013 0956   GLUCOSE 126* 09/10/2013 0956   BUN 15 09/10/2013 0956   CREATININE 1.0 09/10/2013 0956   CALCIUM 9.3 09/10/2013 0956   GFRNONAA 76.43 09/09/2009 0855   GFRAA  02/06/2009 2143    >60        The eGFR has been calculated using the MDRD equation. This calculation has not been validated in all clinical situations. eGFR's persistently <60 mL/min signify possible Chronic Kidney Disease.    Lipid Panel     Component Value Date/Time   CHOL 129 03/12/2013 0843   TRIG 112.0 03/12/2013 0843   HDL 34.30* 03/12/2013 0843   CHOLHDL 4 03/12/2013 0843   VLDL 22.4 03/12/2013 0843   LDLCALC 72 03/12/2013 0843    CBC    Component Value Date/Time   WBC 10.2 09/10/2013 0956   RBC 4.73 09/10/2013 0956   HGB 14.5 09/10/2013 0956   HCT 43.8 09/10/2013 0956   PLT 241.0 09/10/2013 0956   MCV 92.6 09/10/2013 0956   MCHC 33.1 09/10/2013 0956   RDW 13.4 09/10/2013 0956   LYMPHSABS 1.4 09/10/2013 0956   MONOABS 0.5 09/10/2013 0956   EOSABS 0.3 09/10/2013 0956   BASOSABS 0.0 09/10/2013 0956    Hgb A1C Lab Results  Component Value Date   HGBA1C 8.4* 09/10/2013         Assessment & Plan:   Cellulitis of gluteal fold:  eRx for Augmentin BID x 10 days (least interaction with methotrexate) Let your rheumatologist know that you are on an antibiotic Avoid touch the area directly Watch for fever, chills or pain in the area  RTC as needed or if symptoms persist or worsen

## 2013-12-17 NOTE — Progress Notes (Signed)
Pre visit review using our clinic review tool, if applicable. No additional management support is needed unless otherwise documented below in the visit note. 

## 2013-12-17 NOTE — Patient Instructions (Signed)

## 2013-12-31 ENCOUNTER — Telehealth: Payer: Self-pay | Admitting: Internal Medicine

## 2013-12-31 NOTE — Telephone Encounter (Signed)
9:30 on march 16

## 2013-12-31 NOTE — Telephone Encounter (Signed)
Patient's last wellness exam was on 03/13/13.  Patient called to schedule his next wellness exam and your first available is on 04/22/14.  Patient's upset that he would have to wait until April for an appointment.  He wants to be seen early March.  Can patient be seen sooner for his wellness exam?

## 2014-01-10 ENCOUNTER — Emergency Department (HOSPITAL_COMMUNITY)
Admission: EM | Admit: 2014-01-10 | Discharge: 2014-01-10 | Disposition: A | Discharge status: Home / Self Care | Payer: Medicare Other | Attending: Emergency Medicine | Admitting: Emergency Medicine

## 2014-01-10 ENCOUNTER — Encounter (HOSPITAL_COMMUNITY): Payer: Self-pay | Admitting: Emergency Medicine

## 2014-01-10 DIAGNOSIS — Z79899 Other long term (current) drug therapy: Secondary | ICD-10-CM | POA: Diagnosis not present

## 2014-01-10 DIAGNOSIS — Z8669 Personal history of other diseases of the nervous system and sense organs: Secondary | ICD-10-CM | POA: Diagnosis not present

## 2014-01-10 DIAGNOSIS — K219 Gastro-esophageal reflux disease without esophagitis: Secondary | ICD-10-CM | POA: Insufficient documentation

## 2014-01-10 DIAGNOSIS — Z86010 Personal history of colonic polyps: Secondary | ICD-10-CM | POA: Diagnosis not present

## 2014-01-10 DIAGNOSIS — Z85820 Personal history of malignant melanoma of skin: Secondary | ICD-10-CM | POA: Diagnosis not present

## 2014-01-10 DIAGNOSIS — M545 Low back pain: Secondary | ICD-10-CM | POA: Diagnosis not present

## 2014-01-10 DIAGNOSIS — Z792 Long term (current) use of antibiotics: Secondary | ICD-10-CM | POA: Insufficient documentation

## 2014-01-10 DIAGNOSIS — E079 Disorder of thyroid, unspecified: Secondary | ICD-10-CM | POA: Diagnosis not present

## 2014-01-10 DIAGNOSIS — E785 Hyperlipidemia, unspecified: Secondary | ICD-10-CM | POA: Diagnosis not present

## 2014-01-10 DIAGNOSIS — M62830 Muscle spasm of back: Secondary | ICD-10-CM | POA: Insufficient documentation

## 2014-01-10 DIAGNOSIS — E1140 Type 2 diabetes mellitus with diabetic neuropathy, unspecified: Secondary | ICD-10-CM | POA: Insufficient documentation

## 2014-01-10 DIAGNOSIS — M62838 Other muscle spasm: Secondary | ICD-10-CM

## 2014-01-10 DIAGNOSIS — Z7982 Long term (current) use of aspirin: Secondary | ICD-10-CM | POA: Insufficient documentation

## 2014-01-10 DIAGNOSIS — Z87438 Personal history of other diseases of male genital organs: Secondary | ICD-10-CM | POA: Diagnosis not present

## 2014-01-10 DIAGNOSIS — Z87442 Personal history of urinary calculi: Secondary | ICD-10-CM | POA: Insufficient documentation

## 2014-01-10 DIAGNOSIS — M546 Pain in thoracic spine: Secondary | ICD-10-CM

## 2014-01-10 MED ORDER — DIAZEPAM 5 MG PO TABS: 5.0000 mg | ORAL_TABLET | Freq: Two times a day (BID) | ORAL | Status: DC

## 2014-01-10 MED ORDER — IBUPROFEN 600 MG PO TABS: 600.0000 mg | ORAL_TABLET | Freq: Three times a day (TID) | ORAL | Status: DC | PRN

## 2014-01-10 MED ORDER — DIAZEPAM 5 MG PO TABS
5.0000 mg | ORAL_TABLET | Freq: Once | ORAL | Status: AC
  Administered 2014-01-10: 5 mg via ORAL
  Filled 2014-01-10: qty 1

## 2014-01-10 MED ORDER — KETOROLAC TROMETHAMINE 60 MG/2ML IM SOLN
60.0000 mg | Freq: Once | INTRAMUSCULAR | Status: AC
  Administered 2014-01-10: 60 mg via INTRAMUSCULAR
  Filled 2014-01-10: qty 2

## 2014-01-10 MED ORDER — OXYCODONE-ACETAMINOPHEN 5-325 MG PO TABS: 1.0000 | ORAL_TABLET | ORAL | Status: DC | PRN

## 2014-01-10 MED ORDER — OXYCODONE-ACETAMINOPHEN 5-325 MG PO TABS
2.0000 | ORAL_TABLET | Freq: Once | ORAL | Status: AC
  Administered 2014-01-10: 2 via ORAL
  Filled 2014-01-10: qty 2

## 2014-01-10 NOTE — ED Notes (Signed)
Pt reports back pain since Monday after lifting his grandchildren, but states at first it was just on the right side so he though it was a kidney stone but now it is all throughout the middle of his back.

## 2014-01-10 NOTE — Discharge Instructions (Signed)
Back Pain, Adult Low back pain is very common. About 1 in 5 people have back pain.The cause of low back pain is rarely dangerous. The pain often gets better over time.About half of people with a sudden onset of back pain feel better in just 2 weeks. About 8 in 10 people feel better by 6 weeks.  CAUSES Some common causes of back pain include:  Strain of the muscles or ligaments supporting the spine.  Wear and tear (degeneration) of the spinal discs.  Arthritis.  Direct injury to the back. DIAGNOSIS Most of the time, the direct cause of low back pain is not known.However, back pain can be treated effectively even when the exact cause of the pain is unknown.Answering your caregiver's questions about your overall health and symptoms is one of the most accurate ways to make sure the cause of your pain is not dangerous. If your caregiver needs more information, he or she may order lab work or imaging tests (X-rays or MRIs).However, even if imaging tests show changes in your back, this usually does not require surgery. HOME CARE INSTRUCTIONS For many people, back pain returns.Since low back pain is rarely dangerous, it is often a condition that people can learn to manageon their own.   Remain active. It is stressful on the back to sit or stand in one place. Do not sit, drive, or stand in one place for more than 30 minutes at a time. Take short walks on level surfaces as soon as pain allows.Try to increase the length of time you walk each day.  Do not stay in bed.Resting more than 1 or 2 days can delay your recovery.  Do not avoid exercise or work.Your body is made to move.It is not dangerous to be active, even though your back may hurt.Your back will likely heal faster if you return to being active before your pain is gone.  Pay attention to your body when you bend and lift. Many people have less discomfortwhen lifting if they bend their knees, keep the load close to their bodies,and  avoid twisting. Often, the most comfortable positions are those that put less stress on your recovering back.  Find a comfortable position to sleep. Use a firm mattress and lie on your side with your knees slightly bent. If you lie on your back, put a pillow under your knees.  Only take over-the-counter or prescription medicines as directed by your caregiver. Over-the-counter medicines to reduce pain and inflammation are often the most helpful.Your caregiver may prescribe muscle relaxant drugs.These medicines help dull your pain so you can more quickly return to your normal activities and healthy exercise.  Put ice on the injured area.  Put ice in a plastic bag.  Place a towel between your skin and the bag.  Leave the ice on for 15-20 minutes, 03-04 times a day for the first 2 to 3 days. After that, ice and heat may be alternated to reduce pain and spasms.  Ask your caregiver about trying back exercises and gentle massage. This may be of some benefit.  Avoid feeling anxious or stressed.Stress increases muscle tension and can worsen back pain.It is important to recognize when you are anxious or stressed and learn ways to manage it.Exercise is a great option. SEEK MEDICAL CARE IF:  You have pain that is not relieved with rest or medicine.  You have pain that does not improve in 1 week.  You have new symptoms.  You are generally not feeling well. SEEK   IMMEDIATE MEDICAL CARE IF:   You have pain that radiates from your back into your legs.  You develop new bowel or bladder control problems.  You have unusual weakness or numbness in your arms or legs.  You develop nausea or vomiting.  You develop abdominal pain.  You feel faint. Document Released: 12/27/2004 Document Revised: 06/28/2011 Document Reviewed: 04/30/2013 Exit[employer] Patient Information 2015 [employer]. This information is not intended to replace advice given to you by your health care provider. Make sure you  discuss any questions you have with your health care provider.  

## 2014-01-10 NOTE — ED Provider Notes (Signed)
CSN: [id number]     Arrival date & time 01/10/14  1119 History   First MD Initiated Contact with Patient 01/10/14 1140     Chief Complaint  Patient presents with  . Back Pain     The history is provided by the patient.   Patient reports pain in his bilateral lateral low back regions.  He states this began several days ago after lifting a 76-year-old and turning to the side.  Initially he had no pain but now his pain is worsened.  At times feels like he has spasms.  He denies weakness in his arms or legs.  Denies abdominal pain.  Denies fevers or chills.  No nausea or vomiting.  No urinary complaints.  No cough or congestion or shortness of breath.  Symptoms are mild to moderate in severity.   Past Medical History  Diagnosis Date  . NIDDM, uncontrolled, with neuropathy   . Hyperlipidemia   . Thyroid disease   . Melanoma   . GERD (gastroesophageal reflux disease)   . ED (erectile dysfunction)   . History of nephrolithiasis   . Carpal tunnel syndrome of right wrist   . Renal disorder     kidney stones  . Cataract   . Personal history of colonic polyps - 2 small adenomas 07/15/2013   Past Surgical History  Procedure Laterality Date  . Pilonidal cyst / sinus excision    . Knee arthroscopy    . Rotator cuff repair    . Toe surgery    . Cholecystectomy    . Cataract extraction w/ intraocular lens implant Bilateral 11/14 & 2/15  . Melanoma excision      upper right arm   Family History  Problem Relation Age of Onset  . Heart disease Mother   . Diabetes Mother   . Heart disease Brother   . Cancer Neg Hx   . Colon cancer Neg Hx    History  Substance Use Topics  . Smoking status: Never Smoker   . Smokeless tobacco: Never Used  . Alcohol Use: Yes     Comment: 1 glass wine monthly per pt.    Review of Systems  All other systems reviewed and are negative.     Allergies  Atorvastatin  Home Medications   Prior to Admission medications   Medication Sig Start Date End  Date Taking? Authorizing Provider  amoxicillin-clavulanate (AUGMENTIN) 875-125 MG per tablet Take 1 tablet by mouth 2 (two) times daily. 12/17/13   Jearld Fenton, NP  aspirin EC 81 MG tablet Take 81 mg by mouth daily.    Historical Provider, MD  folic acid (FOLVITE) 1 MG tablet  11/11/13   Historical Provider, MD  glipiZIDE (GLUCOTROL) 5 MG tablet Take 1 tablet (5 mg total) by mouth 2 (two) times daily before a meal. 06/25/13   Venia Carbon, MD  glucose blood (ONE TOUCH ULTRA TEST) test strip Use as instructed, to test 2-3 times weekly dx: 250.40 03/13/13   Venia Carbon, MD  levothyroxine (SYNTHROID, LEVOTHROID) 75 MCG tablet Take 1 tablet (75 mcg total) by mouth daily. 06/25/13   Venia Carbon, MD  losartan (COZAAR) 100 MG tablet Take 1 tablet (100 mg total) by mouth daily. 06/25/13   Venia Carbon, MD  metFORMIN (GLUCOPHAGE) 500 MG tablet Take 2 tablets (1,000 mg total) by mouth 2 (two) times daily with a meal. 06/25/13   Venia Carbon, MD  methotrexate (RHEUMATREX) 2.5 MG tablet Take 7.5 mg by  mouth 2 (two) times daily. On Tuesdays only 12/03/13   Historical Provider, MD  omeprazole (PRILOSEC) 20 MG capsule Take 1 capsule (20 mg total) by mouth daily. 06/25/13   Venia Carbon, MD  ONE [facility] LANCETS MISC Use to test blood sugar 2-3 times weekly dx: 250.40 03/13/13   Venia Carbon, MD  simvastatin (ZOCOR) 80 MG tablet Take 1 tablet (80 mg total) by mouth at bedtime. 06/25/13   Venia Carbon, MD   BP 97/56 mmHg  Pulse 90  Temp(Src) 98 F (36.7 C) (Oral)  Resp 18  SpO2 97% Physical Exam  Constitutional: He is oriented to person, place, and time. He appears well-developed and well-nourished.  HENT:  Head: Normocephalic and atraumatic.  Eyes: EOM are normal.  Neck: Normal range of motion.  Cardiovascular: Normal rate, regular rhythm, normal heart sounds and intact distal pulses.   Pulmonary/Chest: Effort normal and breath sounds normal. No respiratory distress.  Abdominal:  Soft. He exhibits no distension. There is no tenderness.  Musculoskeletal: Normal range of motion.  No thoracic or lumbar point tenderness.  Mild paralumbar and parathoracic tenderness  Neurological: He is alert and oriented to person, place, and time.  Skin: Skin is warm and dry.  Psychiatric: He has a normal mood and affect. Judgment normal.  Nursing note and vitals reviewed.   ED Course  Procedures (including critical care time) Labs Review Labs Reviewed - No data to display  Imaging Review No results found.   EKG Interpretation None      MDM   Final diagnoses:  None    Patient feels much better after pain medicine and muscle relaxants.  Abdomen is benign.  Doubt spinal process.  No indication for imaging.  Likely musculoskeletal.  Understands return to the ER for new or worsening symptoms.    Hoy Morn, MD 01/10/14 [id number]

## 2014-01-10 NOTE — ED Notes (Signed)
Pt c/o lower back pain that is positional after bending to pick up his grandchild on Monday; pt denies radiation down leg

## 2014-03-18 LAB — HM DIABETES EYE EXAM

## 2014-03-19 DIAGNOSIS — H52223 Regular astigmatism, bilateral: Secondary | ICD-10-CM | POA: Diagnosis not present

## 2014-03-19 DIAGNOSIS — Z9849 Cataract extraction status, unspecified eye: Secondary | ICD-10-CM | POA: Diagnosis not present

## 2014-03-19 DIAGNOSIS — M069 Rheumatoid arthritis, unspecified: Secondary | ICD-10-CM | POA: Diagnosis not present

## 2014-03-19 DIAGNOSIS — M79673 Pain in unspecified foot: Secondary | ICD-10-CM | POA: Diagnosis not present

## 2014-03-19 DIAGNOSIS — H01029 Squamous blepharitis unspecified eye, unspecified eyelid: Secondary | ICD-10-CM | POA: Diagnosis not present

## 2014-03-19 DIAGNOSIS — E119 Type 2 diabetes mellitus without complications: Secondary | ICD-10-CM | POA: Diagnosis not present

## 2014-03-26 ENCOUNTER — Encounter: Payer: Self-pay | Admitting: Internal Medicine

## 2014-03-26 ENCOUNTER — Ambulatory Visit (INDEPENDENT_AMBULATORY_CARE_PROVIDER_SITE_OTHER): Payer: Medicare Other | Admitting: Internal Medicine

## 2014-03-26 VITALS — BP 110/60 | HR 82 | Temp 97.5°F | Ht 68.0 in | Wt 180.0 lb

## 2014-03-26 DIAGNOSIS — K2100 Gastro-esophageal reflux disease with esophagitis, without bleeding: Secondary | ICD-10-CM

## 2014-03-26 DIAGNOSIS — E785 Hyperlipidemia, unspecified: Secondary | ICD-10-CM | POA: Diagnosis not present

## 2014-03-26 DIAGNOSIS — E1141 Type 2 diabetes mellitus with diabetic mononeuropathy: Secondary | ICD-10-CM | POA: Diagnosis not present

## 2014-03-26 DIAGNOSIS — E1165 Type 2 diabetes mellitus with hyperglycemia: Secondary | ICD-10-CM

## 2014-03-26 DIAGNOSIS — M0600 Rheumatoid arthritis without rheumatoid factor, unspecified site: Secondary | ICD-10-CM | POA: Diagnosis not present

## 2014-03-26 DIAGNOSIS — E1149 Type 2 diabetes mellitus with other diabetic neurological complication: Secondary | ICD-10-CM

## 2014-03-26 DIAGNOSIS — I10 Essential (primary) hypertension: Secondary | ICD-10-CM

## 2014-03-26 DIAGNOSIS — E1142 Type 2 diabetes mellitus with diabetic polyneuropathy: Secondary | ICD-10-CM

## 2014-03-26 DIAGNOSIS — E039 Hypothyroidism, unspecified: Secondary | ICD-10-CM

## 2014-03-26 DIAGNOSIS — Z0000 Encounter for general adult medical examination without abnormal findings: Secondary | ICD-10-CM | POA: Diagnosis not present

## 2014-03-26 DIAGNOSIS — Z7189 Other specified counseling: Secondary | ICD-10-CM

## 2014-03-26 DIAGNOSIS — IMO0002 Reserved for concepts with insufficient information to code with codable children: Secondary | ICD-10-CM

## 2014-03-26 LAB — COMPREHENSIVE METABOLIC PANEL
ALBUMIN: 4.2 g/dL (ref 3.5–5.2)
ALK PHOS: 63 U/L (ref 39–117)
ALT: 9 U/L (ref 0–53)
AST: 15 U/L (ref 0–37)
BUN: 21 mg/dL (ref 6–23)
CHLORIDE: 105 meq/L (ref 96–112)
CO2: 30 mEq/L (ref 19–32)
Calcium: 9.6 mg/dL (ref 8.4–10.5)
Creatinine, Ser: 1 mg/dL (ref 0.40–1.50)
GFR: 77.22 mL/min (ref >60.00)
GLUCOSE: 184 mg/dL — AB (ref 70–99)
Potassium: 4.7 mEq/L (ref 3.5–5.1)
Sodium: 139 mEq/L (ref 135–145)
TOTAL PROTEIN: 7 g/dL (ref 6.0–8.3)
Total Bilirubin: 0.5 mg/dL (ref 0.2–1.2)

## 2014-03-26 LAB — CBC WITH DIFFERENTIAL/PLATELET
Basophils Absolute: 0 10*3/uL (ref 0.0–0.1)
Basophils Relative: 0.2 % (ref 0.0–3.0)
Eosinophils Absolute: 0.3 10*3/uL (ref 0.0–0.7)
Eosinophils Relative: 3.4 % (ref 0.0–5.0)
HEMATOCRIT: 42.8 % (ref 39.0–52.0)
Hemoglobin: 14.2 g/dL (ref 13.0–17.0)
LYMPHS ABS: 1.8 10*3/uL (ref 0.7–4.0)
Lymphocytes Relative: 19.4 % (ref 12.0–46.0)
MCHC: 33.2 g/dL (ref 30.0–36.0)
MCV: 90.8 fl (ref 78.0–100.0)
MONO ABS: 0.5 10*3/uL (ref 0.1–1.0)
Monocytes Relative: 5.4 % (ref 3.0–12.0)
Neutro Abs: 6.6 10*3/uL (ref 1.4–7.7)
Neutrophils Relative %: 71.6 % (ref 43.0–77.0)
PLATELETS: 213 10*3/uL (ref 150.0–400.0)
RBC: 4.71 Mil/uL (ref 4.22–5.81)
RDW: 13.9 % (ref 11.5–15.5)
WBC: 9.2 10*3/uL (ref 4.0–10.5)

## 2014-03-26 LAB — HM DIABETES FOOT EXAM

## 2014-03-26 LAB — LIPID PANEL
CHOL/HDL RATIO: 4
Cholesterol: 134 mg/dL (ref 0–200)
HDL: 35 mg/dL — AB (ref >39.00)
LDL Cholesterol: 78 mg/dL (ref 0–99)
NONHDL: 99
Triglycerides: 105 mg/dL (ref 0.0–149.0)
VLDL: 21 mg/dL (ref 0.0–40.0)

## 2014-03-26 LAB — HEMOGLOBIN A1C: Hgb A1c MFr Bld: 8.5 % — ABNORMAL HIGH (ref 4.6–6.5)

## 2014-03-26 LAB — TSH: TSH: 2.1 u[IU]/mL (ref 0.35–4.50)

## 2014-03-26 LAB — T4, FREE: FREE T4: 0.95 ng/dL (ref 0.60–1.60)

## 2014-03-26 MED ORDER — ONETOUCH LANCETS MISC: Status: AC

## 2014-03-26 MED ORDER — METFORMIN HCL 500 MG PO TABS: 1000.0000 mg | ORAL_TABLET | Freq: Two times a day (BID) | ORAL | Status: DC

## 2014-03-26 MED ORDER — SIMVASTATIN 80 MG PO TABS: 80.0000 mg | ORAL_TABLET | Freq: Every day | ORAL | Status: DC

## 2014-03-26 MED ORDER — GLUCOSE BLOOD VI STRP: ORAL_STRIP | Status: DC

## 2014-03-26 MED ORDER — LOSARTAN POTASSIUM 100 MG PO TABS
100.0000 mg | ORAL_TABLET | Freq: Every day | ORAL | Status: DC
Start: 2014-03-26 — End: 2014-12-22

## 2014-03-26 MED ORDER — GLIPIZIDE 5 MG PO TABS: 5.0000 mg | ORAL_TABLET | Freq: Two times a day (BID) | ORAL | Status: DC

## 2014-03-26 MED ORDER — LEVOTHYROXINE SODIUM 75 MCG PO TABS: 75.0000 ug | ORAL_TABLET | Freq: Every day | ORAL | Status: DC

## 2014-03-26 MED ORDER — OMEPRAZOLE 20 MG PO CPDR: 20.0000 mg | DELAYED_RELEASE_CAPSULE | Freq: Every day | ORAL | Status: DC

## 2014-03-26 NOTE — Assessment & Plan Note (Signed)
Seems euthyroid Due for labs 

## 2014-03-26 NOTE — Assessment & Plan Note (Signed)
Seems stable Discussed pros and cons of more medication---will hold off unless close to or over 9%

## 2014-03-26 NOTE — Assessment & Plan Note (Signed)
See social history 

## 2014-03-26 NOTE — Assessment & Plan Note (Signed)
Resolved now May have been post infectious

## 2014-03-26 NOTE — Progress Notes (Signed)
Subjective:    Patient ID: Sean Koch, male    DOB: [birth year], 74 y.o.   MRN: [id number]  HPI Here for Medicare wellness visit and follow up of chronic medical problems Reviewed form and advanced directives Reviewed other physicians No alcohol or tobacco Tries to exercise regularly  Did see Dr Jolee Ewing Diagnosed with seronegative RA Had prednisone and then MTX for some time Hands did remit and doing well now Off all meds now  Still has sugars running up some Checks at least once a week Highest ~160 No hypoglycemic reactions Tingling in feet still--but no pain  No problems with statin Does get some shoulder soreness---doing water aerobics  No heartburn on prilosec No dysphagia  Current Outpatient Prescriptions on File Prior to Visit  Medication Sig Dispense Refill  . aspirin EC 81 MG tablet Take 81 mg by mouth daily.    Marland Kitchen glipiZIDE (GLUCOTROL) 5 MG tablet Take 1 tablet (5 mg total) by mouth 2 (two) times daily before a meal. 8 tablet 0  . glucose blood (ONE TOUCH ULTRA TEST) test strip Use as instructed, to test 2-3 times weekly dx: 250.40 100 each 3  . levothyroxine (SYNTHROID, LEVOTHROID) 75 MCG tablet Take 1 tablet (75 mcg total) by mouth daily. 4 tablet 0  . losartan (COZAAR) 100 MG tablet Take 1 tablet (100 mg total) by mouth daily. 4 tablet 0  . metFORMIN (GLUCOPHAGE) 500 MG tablet Take 2 tablets (1,000 mg total) by mouth 2 (two) times daily with a meal. 16 tablet 0  . omeprazole (PRILOSEC) 20 MG capsule Take 1 capsule (20 mg total) by mouth daily. 4 capsule 0  . ONE TOUCH LANCETS MISC Use to test blood sugar 2-3 times weekly dx: 250.40 100 each 3  . simvastatin (ZOCOR) 80 MG tablet Take 1 tablet (80 mg total) by mouth at bedtime. 4 tablet 0   No current facility-administered medications on file prior to visit.    Allergies  Allergen Reactions  . Atorvastatin     REACTION: Joint aches    Past Medical History  Diagnosis Date  . NIDDM, uncontrolled,  with neuropathy   . Hyperlipidemia   . Thyroid disease   . Melanoma   . GERD (gastroesophageal reflux disease)   . ED (erectile dysfunction)   . History of nephrolithiasis   . Carpal tunnel syndrome of right wrist   . Renal disorder     kidney stones  . Cataract   . Personal history of colonic polyps - 2 small adenomas 07/15/2013  . Seronegative rheumatoid arthritis     Past Surgical History  Procedure Laterality Date  . Pilonidal cyst / sinus excision    . Knee arthroscopy    . Rotator cuff repair    . Toe surgery    . Cholecystectomy    . Cataract extraction w/ intraocular lens implant Bilateral 11/14 & 2/15  . Melanoma excision      upper right arm    Family History  Problem Relation Age of Onset  . Heart disease Mother   . Diabetes Mother   . Heart disease Brother   . Cancer Neg Hx   . Colon cancer Neg Hx     History   Social History  . Marital Status: Married    Spouse Name: N/A  . Number of Children: 2  . Years of Education: N/A   Occupational History  . retired Geneticist, molecular    Social History Main Topics  . Smoking status: Never Smoker   .  Smokeless tobacco: Never Used  . Alcohol Use: Yes     Comment: 1 glass wine monthly per pt.  . Drug Use: No  . Sexual Activity: Not on file   Other Topics Concern  . Not on file   Social History Narrative   Has living will    Wife, then son Donzell, would be health care POA   Would accept resuscitation. No prolonged artificial ventilation.   Not sure about tube feeds   Review of Systems No arthritis pain now--- left knee does get sore with walking though Some itching at times---no rash Sleeps fair--usually 6-7 hours Vertigo is gone Appetite is fine Weight stable Some diseased teeth--due for extractions and partial Wears seat belt No urinary problems    Objective:   Physical Exam  Constitutional: He is oriented to person, place, and time. He appears well-developed and well-nourished. No distress.  HENT:    Mouth/Throat: Oropharynx is clear and moist. No oropharyngeal exudate.  Neck: Normal range of motion. Neck supple. No thyromegaly present.  Cardiovascular: Normal rate, regular rhythm, normal heart sounds and intact distal pulses.  Exam reveals no gallop.   No murmur heard. Pulmonary/Chest: Effort normal and breath sounds normal. No respiratory distress. He has no wheezes. He has no rales.  Abdominal: Soft. There is no tenderness.  Musculoskeletal: He exhibits no edema or tenderness.  Lymphadenopathy:    He has no cervical adenopathy.  Neurological: He is alert and oriented to person, place, and time.  President--- "Mady Gemma, [place]--- then Sundra Aland [id number] Recall 1/3  Mild decreased fine touch sensation in feet  Skin: No rash noted. No erythema.  No foot lesions  Psychiatric: He has a normal mood and affect. His behavior is normal.          Assessment & Plan:

## 2014-03-26 NOTE — Assessment & Plan Note (Signed)
I have personally reviewed the Medicare Annual Wellness questionnaire and have noted 1. The patient's medical and social history 2. Their use of alcohol, tobacco or illicit drugs 3. Their current medications and supplements 4. The patient's functional ability including ADL's, fall risks, home safety risks and hearing or visual             impairment. 5. Diet and physical activities 6. Evidence for depression or mood disorders  The patients weight, height, BMI and visual acuity have been recorded in the chart I have made referrals, counseling and provided education to the patient based review of the above and I have provided the pt with a written personalized care plan for preventive services.  I have provided you with a copy of your personalized plan for preventive services. Please take the time to review along with your updated medication list.  No PSA due to age UTD on colonoscopy No zostavax due to not having chicken pox (he doesn't think)

## 2014-03-26 NOTE — Progress Notes (Signed)
Pre visit review using our clinic review tool, if applicable. No additional management support is needed unless otherwise documented below in the visit note. 

## 2014-03-26 NOTE — Assessment & Plan Note (Signed)
Mild symptoms and no pain No Rx indicated

## 2014-03-26 NOTE — Assessment & Plan Note (Signed)
No problems with statin 

## 2014-03-26 NOTE — Assessment & Plan Note (Signed)
BP Readings from Last 3 Encounters:  03/26/14 110/60  01/10/14 110/72  12/17/13 124/58   On ARB

## 2014-03-26 NOTE — Addendum Note (Signed)
Addended by: Despina Hidden on: 03/26/2014 10:45 AM   Modules accepted: Orders

## 2014-03-26 NOTE — Assessment & Plan Note (Signed)
Quiet on the PPI 

## 2014-03-27 ENCOUNTER — Telehealth: Payer: Self-pay | Admitting: Internal Medicine

## 2014-03-27 NOTE — Telephone Encounter (Signed)
emmi emailed °

## 2014-07-07 ENCOUNTER — Other Ambulatory Visit: Payer: Self-pay

## 2014-08-05 DIAGNOSIS — M7532 Calcific tendinitis of left shoulder: Secondary | ICD-10-CM | POA: Diagnosis not present

## 2014-08-05 DIAGNOSIS — M25512 Pain in left shoulder: Secondary | ICD-10-CM | POA: Diagnosis not present

## 2014-09-09 DIAGNOSIS — M7531 Calcific tendinitis of right shoulder: Secondary | ICD-10-CM | POA: Diagnosis not present

## 2014-09-17 ENCOUNTER — Encounter: Payer: Self-pay | Admitting: Internal Medicine

## 2014-09-17 ENCOUNTER — Ambulatory Visit (INDEPENDENT_AMBULATORY_CARE_PROVIDER_SITE_OTHER): Payer: Medicare Other | Admitting: Internal Medicine

## 2014-09-17 VITALS — BP 110/68 | HR 62 | Temp 98.6°F | Wt 179.0 lb

## 2014-09-17 DIAGNOSIS — I10 Essential (primary) hypertension: Secondary | ICD-10-CM | POA: Diagnosis not present

## 2014-09-17 DIAGNOSIS — K2100 Gastro-esophageal reflux disease with esophagitis, without bleeding: Secondary | ICD-10-CM

## 2014-09-17 DIAGNOSIS — E1149 Type 2 diabetes mellitus with other diabetic neurological complication: Secondary | ICD-10-CM

## 2014-09-17 DIAGNOSIS — E1141 Type 2 diabetes mellitus with diabetic mononeuropathy: Secondary | ICD-10-CM

## 2014-09-17 DIAGNOSIS — E1165 Type 2 diabetes mellitus with hyperglycemia: Principal | ICD-10-CM

## 2014-09-17 DIAGNOSIS — M0600 Rheumatoid arthritis without rheumatoid factor, unspecified site: Secondary | ICD-10-CM

## 2014-09-17 DIAGNOSIS — Z23 Encounter for immunization: Secondary | ICD-10-CM

## 2014-09-17 DIAGNOSIS — IMO0002 Reserved for concepts with insufficient information to code with codable children: Secondary | ICD-10-CM

## 2014-09-17 LAB — HEMOGLOBIN A1C: HEMOGLOBIN A1C: 8.5 % — AB (ref 4.6–6.5)

## 2014-09-17 MED ORDER — ONETOUCH ULTRA SYSTEM W/DEVICE KIT: PACK | Status: DC

## 2014-09-17 NOTE — Assessment & Plan Note (Signed)
Likely postinfectious as continues to be quiet without Rx

## 2014-09-17 NOTE — Assessment & Plan Note (Signed)
BP Readings from Last 3 Encounters:  09/17/14 110/68  03/26/14 110/60  01/10/14 110/72   Good control

## 2014-09-17 NOTE — Assessment & Plan Note (Signed)
Quiet on the PPI Will continue 

## 2014-09-17 NOTE — Addendum Note (Signed)
Addended by: Despina Hidden on: 09/17/2014 03:54 PM   Modules accepted: Orders, Medications

## 2014-09-17 NOTE — Progress Notes (Signed)
Subjective:    Patient ID: Sean Koch, male    DOB: [birth year], 74 y.o.   MRN: [id number]  HPI Here for follow up of diabetes and other chronic medical conditions  He feels well Checks sugars rarely--not recently 140 fasting was typical No hypoglycemic reactions No pain or sores in feet Some foot tingling--not a problem  Not really active recently--had shoulder injury Some better with cortisone shot from Dr Jolee Ewing Arthritis in hands still quiet  No chest pain No SOB No dizziness or syncope  Tolerating statin No major myalgias  Current Outpatient Prescriptions on File Prior to Visit  Medication Sig Dispense Refill  . aspirin EC 81 MG tablet Take 81 mg by mouth daily.    . Calcium Carb-Cholecalciferol 600-800 MG-UNIT TABS Take by mouth daily.    Marland Kitchen glipiZIDE (GLUCOTROL) 5 MG tablet Take 1 tablet (5 mg total) by mouth 2 (two) times daily before a meal. 180 tablet 3  . glucose blood (ONE TOUCH ULTRA TEST) test strip Use as instructed, to test 2-3 times weekly dx: E11.49 100 each 3  . levothyroxine (SYNTHROID, LEVOTHROID) 75 MCG tablet Take 1 tablet (75 mcg total) by mouth daily. 90 tablet 3  . losartan (COZAAR) 100 MG tablet Take 1 tablet (100 mg total) by mouth daily. 90 tablet 3  . metFORMIN (GLUCOPHAGE) 500 MG tablet Take 2 tablets (1,000 mg total) by mouth 2 (two) times daily with a meal. 360 tablet 3  . omeprazole (PRILOSEC) 20 MG capsule Take 1 capsule (20 mg total) by mouth daily. 90 capsule 3  . ONE TOUCH LANCETS MISC Use to test blood sugar 2-3 times weekly dx: E11.49 100 each 3  . simvastatin (ZOCOR) 80 MG tablet Take 1 tablet (80 mg total) by mouth at bedtime. 90 tablet 3   No current facility-administered medications on file prior to visit.    Allergies  Allergen Reactions  . Atorvastatin     REACTION: Joint aches    Past Medical History  Diagnosis Date  . NIDDM, uncontrolled, with neuropathy   . Hyperlipidemia   . Thyroid disease   . Melanoma     . GERD (gastroesophageal reflux disease)   . ED (erectile dysfunction)   . History of nephrolithiasis   . Carpal tunnel syndrome of right wrist   . Renal disorder     kidney stones  . Cataract   . Personal history of colonic polyps - 2 small adenomas 07/15/2013  . Seronegative rheumatoid arthritis     Past Surgical History  Procedure Laterality Date  . Pilonidal cyst / sinus excision    . Knee arthroscopy    . Rotator cuff repair    . Toe surgery    . Cholecystectomy    . Cataract extraction w/ intraocular lens implant Bilateral 11/14 & 2/15  . Melanoma excision      upper right arm    Family History  Problem Relation Age of Onset  . Heart disease Mother   . Diabetes Mother   . Heart disease Brother   . Cancer Neg Hx   . Colon cancer Neg Hx     Social History   Social History  . Marital Status: Married    Spouse Name: N/A  . Number of Children: 2  . Years of Education: N/A   Occupational History  . retired Geneticist, molecular    Social History Main Topics  . Smoking status: Never Smoker   . Smokeless tobacco: Never Used  . Alcohol Use: Yes  Comment: 1 glass wine monthly per pt.  . Drug Use: No  . Sexual Activity: Not on file   Other Topics Concern  . Not on file   Social History Narrative   Has living will    Wife, then son Sean Koch, would be health care POA   Would accept resuscitation. No prolonged artificial ventilation.   Not sure about tube feeds   Review of Systems Appetite is good--not as much as he used to Weight stable Sleep is still not great--will lie awake at times No sig daytime somnolence Sees dermatologist regularly    Objective:   Physical Exam  Constitutional: He appears well-developed and well-nourished. No distress.  Neck: Normal range of motion. Neck supple. No thyromegaly present.  Cardiovascular: Normal rate, regular rhythm, normal heart sounds and intact distal pulses.  Exam reveals no gallop.   No murmur heard. Pulmonary/Chest:  Effort normal and breath sounds normal. No respiratory distress. He has no wheezes. He has no rales.  Musculoskeletal: He exhibits no edema or tenderness.  Skin:  No foot lesions  Psychiatric: He has a normal mood and affect. His behavior is normal.          Assessment & Plan:

## 2014-09-17 NOTE — Assessment & Plan Note (Signed)
Discussed increasing his exercise Goal under 8%, action if >9%

## 2014-09-17 NOTE — Progress Notes (Signed)
Pre visit review using our clinic review tool, if applicable. No additional management support is needed unless otherwise documented below in the visit note. 

## 2014-09-19 ENCOUNTER — Encounter: Payer: Self-pay | Admitting: *Deleted

## 2014-11-18 DIAGNOSIS — D225 Melanocytic nevi of trunk: Secondary | ICD-10-CM | POA: Diagnosis not present

## 2014-11-18 DIAGNOSIS — Z85820 Personal history of malignant melanoma of skin: Secondary | ICD-10-CM | POA: Diagnosis not present

## 2014-11-18 DIAGNOSIS — Z85828 Personal history of other malignant neoplasm of skin: Secondary | ICD-10-CM | POA: Diagnosis not present

## 2014-11-18 DIAGNOSIS — L820 Inflamed seborrheic keratosis: Secondary | ICD-10-CM | POA: Diagnosis not present

## 2014-11-18 DIAGNOSIS — L821 Other seborrheic keratosis: Secondary | ICD-10-CM | POA: Diagnosis not present

## 2014-11-18 DIAGNOSIS — D485 Neoplasm of uncertain behavior of skin: Secondary | ICD-10-CM | POA: Diagnosis not present

## 2014-12-22 ENCOUNTER — Other Ambulatory Visit: Payer: Self-pay | Admitting: Internal Medicine

## 2015-01-27 DIAGNOSIS — M7531 Calcific tendinitis of right shoulder: Secondary | ICD-10-CM | POA: Diagnosis not present

## 2015-01-27 DIAGNOSIS — M25562 Pain in left knee: Secondary | ICD-10-CM | POA: Diagnosis not present

## 2015-02-25 DIAGNOSIS — Z9842 Cataract extraction status, left eye: Secondary | ICD-10-CM | POA: Diagnosis not present

## 2015-02-25 DIAGNOSIS — Z9841 Cataract extraction status, right eye: Secondary | ICD-10-CM | POA: Diagnosis not present

## 2015-02-25 DIAGNOSIS — E119 Type 2 diabetes mellitus without complications: Secondary | ICD-10-CM | POA: Diagnosis not present

## 2015-02-25 DIAGNOSIS — H01009 Unspecified blepharitis unspecified eye, unspecified eyelid: Secondary | ICD-10-CM | POA: Diagnosis not present

## 2015-02-25 LAB — HM DIABETES EYE EXAM

## 2015-03-02 ENCOUNTER — Encounter: Payer: Self-pay | Admitting: Internal Medicine

## 2015-03-17 ENCOUNTER — Encounter: Payer: Self-pay | Admitting: Internal Medicine

## 2015-03-17 ENCOUNTER — Ambulatory Visit (INDEPENDENT_AMBULATORY_CARE_PROVIDER_SITE_OTHER): Payer: Medicare Other | Admitting: Internal Medicine

## 2015-03-17 VITALS — BP 108/66 | HR 77 | Temp 98.5°F | Wt 180.0 lb

## 2015-03-17 DIAGNOSIS — K2100 Gastro-esophageal reflux disease with esophagitis, without bleeding: Secondary | ICD-10-CM

## 2015-03-17 DIAGNOSIS — E1165 Type 2 diabetes mellitus with hyperglycemia: Secondary | ICD-10-CM | POA: Diagnosis not present

## 2015-03-17 DIAGNOSIS — E039 Hypothyroidism, unspecified: Secondary | ICD-10-CM

## 2015-03-17 DIAGNOSIS — IMO0002 Reserved for concepts with insufficient information to code with codable children: Secondary | ICD-10-CM

## 2015-03-17 DIAGNOSIS — M0600 Rheumatoid arthritis without rheumatoid factor, unspecified site: Secondary | ICD-10-CM | POA: Diagnosis not present

## 2015-03-17 DIAGNOSIS — E1140 Type 2 diabetes mellitus with diabetic neuropathy, unspecified: Secondary | ICD-10-CM

## 2015-03-17 LAB — CBC WITH DIFFERENTIAL/PLATELET
BASOS ABS: 0 10*3/uL (ref 0.0–0.1)
BASOS PCT: 0.5 % (ref 0.0–3.0)
EOS PCT: 3.4 % (ref 0.0–5.0)
Eosinophils Absolute: 0.3 10*3/uL (ref 0.0–0.7)
HEMATOCRIT: 43.6 % (ref 39.0–52.0)
Hemoglobin: 14.5 g/dL (ref 13.0–17.0)
LYMPHS PCT: 20 % (ref 12.0–46.0)
Lymphs Abs: 1.9 10*3/uL (ref 0.7–4.0)
MCHC: 33.3 g/dL (ref 30.0–36.0)
MCV: 91 fl (ref 78.0–100.0)
MONOS PCT: 5.8 % (ref 3.0–12.0)
Monocytes Absolute: 0.6 10*3/uL (ref 0.1–1.0)
NEUTROS ABS: 6.8 10*3/uL (ref 1.4–7.7)
Neutrophils Relative %: 70.3 % (ref 43.0–77.0)
PLATELETS: 223 10*3/uL (ref 150.0–400.0)
RBC: 4.79 Mil/uL (ref 4.22–5.81)
RDW: 13.9 % (ref 11.5–15.5)
WBC: 9.7 10*3/uL (ref 4.0–10.5)

## 2015-03-17 LAB — COMPREHENSIVE METABOLIC PANEL
ALT: 9 U/L (ref 0–53)
AST: 13 U/L (ref 0–37)
Albumin: 4.3 g/dL (ref 3.5–5.2)
Alkaline Phosphatase: 60 U/L (ref 39–117)
BILIRUBIN TOTAL: 0.9 mg/dL (ref 0.2–1.2)
BUN: 20 mg/dL (ref 6–23)
CALCIUM: 9.7 mg/dL (ref 8.4–10.5)
CHLORIDE: 104 meq/L (ref 96–112)
CO2: 26 meq/L (ref 19–32)
Creatinine, Ser: 1.11 mg/dL (ref 0.40–1.50)
GFR: 68.28 mL/min (ref >60.00)
Glucose, Bld: 221 mg/dL — ABNORMAL HIGH (ref 70–99)
POTASSIUM: 4.4 meq/L (ref 3.5–5.1)
Sodium: 140 mEq/L (ref 135–145)
Total Protein: 6.9 g/dL (ref 6.0–8.3)

## 2015-03-17 LAB — LIPID PANEL
CHOL/HDL RATIO: 4
Cholesterol: 139 mg/dL (ref 0–200)
HDL: 34.6 mg/dL — AB (ref >39.00)
LDL Cholesterol: 72 mg/dL (ref 0–99)
NONHDL: 104.07
Triglycerides: 162 mg/dL — ABNORMAL HIGH (ref 0.0–149.0)
VLDL: 32.4 mg/dL (ref 0.0–40.0)

## 2015-03-17 LAB — HEMOGLOBIN A1C: HEMOGLOBIN A1C: 9.1 % — AB (ref 4.6–6.5)

## 2015-03-17 LAB — TSH: TSH: 1.65 u[IU]/mL (ref 0.35–4.50)

## 2015-03-17 LAB — T4, FREE: Free T4: 0.96 ng/dL (ref 0.60–1.60)

## 2015-03-17 LAB — HM DIABETES FOOT EXAM

## 2015-03-17 MED ORDER — GLIPIZIDE 5 MG PO TABS: ORAL_TABLET | ORAL | Status: DC

## 2015-03-17 MED ORDER — LEVOTHYROXINE SODIUM 75 MCG PO TABS: ORAL_TABLET | ORAL | Status: DC

## 2015-03-17 MED ORDER — SIMVASTATIN 80 MG PO TABS: ORAL_TABLET | ORAL | Status: DC

## 2015-03-17 MED ORDER — LOSARTAN POTASSIUM 100 MG PO TABS: ORAL_TABLET | ORAL | Status: DC

## 2015-03-17 MED ORDER — OMEPRAZOLE 20 MG PO CPDR: DELAYED_RELEASE_CAPSULE | ORAL | Status: DC

## 2015-03-17 MED ORDER — METFORMIN HCL 500 MG PO TABS: ORAL_TABLET | ORAL | Status: DC

## 2015-03-17 NOTE — Progress Notes (Signed)
Subjective:    Patient ID: Sean Koch, male    DOB: [birth year], 75 y.o.   MRN: [id number]  HPI Here for follow up of diabetes and other chronic health conditions  Doing well Had spell last week with sugar going up to 300. Then down to 220 later in the day Next day was back down to usual Not ill and no changes in eating Usually 130 or so No hypoglycemic Only spotty leg numbness---no sig pain  [facility] right shoulder hurting Still sees Dr Verda Cumins shot and it eventually seemed to improve No other joint swelling Some left knee --?early OA though  No problems with statin No myalgias or GI problems  Heartburn controlled with omeprazole No swallowing problems  Current Outpatient Prescriptions on File Prior to Visit  Medication Sig Dispense Refill  . aspirin EC 81 MG tablet Take 81 mg by mouth daily.    . Blood Glucose Monitoring Suppl (ONE TOUCH ULTRA SYSTEM KIT) W/DEVICE KIT Use to test blood sugar once daily DX: E11.41 1 each 0  . Calcium Carb-Cholecalciferol 600-800 MG-UNIT TABS Take by mouth daily.    Marland Kitchen glucose blood (ONE TOUCH ULTRA TEST) test strip Use as instructed, to test 2-3 times weekly dx: E11.49 100 each 3  . ONE TOUCH LANCETS MISC Use to test blood sugar 2-3 times weekly dx: E11.49 100 each 3   No current facility-administered medications on file prior to visit.    Allergies  Allergen Reactions  . Atorvastatin     REACTION: Joint aches    Past Medical History  Diagnosis Date  . NIDDM, uncontrolled, with neuropathy   . Hyperlipidemia   . Thyroid disease   . Melanoma ([place])   . GERD (gastroesophageal reflux disease)   . ED (erectile dysfunction)   . History of nephrolithiasis   . Carpal tunnel syndrome of right wrist   . Renal disorder     kidney stones  . Cataract   . Personal history of colonic polyps - 2 small adenomas 07/15/2013  . Seronegative rheumatoid arthritis [facility])     Past Surgical History    Procedure Laterality Date  . Pilonidal cyst / sinus excision    . Knee arthroscopy    . Rotator cuff repair    . Toe surgery    . Cholecystectomy    . Cataract extraction w/ intraocular lens implant Bilateral 11/14 & 2/15  . Melanoma excision      upper right arm    Family History  Problem Relation Age of Onset  . Heart disease Mother   . Diabetes Mother   . Heart disease Brother   . Cancer Neg Hx   . Colon cancer Neg Hx     Social History   Social History  . Marital Status: Married    Spouse Name: N/A  . Number of Children: 2  . Years of Education: N/A   Occupational History  . retired Geneticist, molecular    Social History Main Topics  . Smoking status: Never Smoker   . Smokeless tobacco: Never Used  . Alcohol Use: Yes     Comment: 1 glass wine monthly per pt.  . Drug Use: No  . Sexual Activity: Not on file   Other Topics Concern  . Not on file   Social History Narrative   Has living will    Wife, then son Greer, would be health care POA   Would accept resuscitation. No prolonged artificial ventilation.  Not sure about tube feeds   Review of Systems Appetite is good Weight stable Sleeps well Bowels are fine Voids okay--- nocturia x 1 No falls No depression or anhedonia    Objective:   Physical Exam  Constitutional: He appears well-developed and well-nourished. No distress.  Neck: Normal range of motion. Neck supple. No thyromegaly present.  Cardiovascular: Normal rate, regular rhythm and normal heart sounds.  Exam reveals no gallop.   No murmur heard. Feet warm Faint pulse on left but absent on right  Pulmonary/Chest: Effort normal and breath sounds normal. No respiratory distress. He has no wheezes. He has no rales.  Abdominal: Soft. There is no tenderness.  Musculoskeletal: He exhibits no edema or tenderness.  Lymphadenopathy:    He has no cervical adenopathy.  Neurological:  Sensation fairly normal on plantar feet  Skin:  No foot lesions   Psychiatric: He has a normal mood and affect. His behavior is normal.          Assessment & Plan:

## 2015-03-17 NOTE — Assessment & Plan Note (Signed)
Seems euthyroid Due for labs 

## 2015-03-17 NOTE — Assessment & Plan Note (Addendum)
Will need to increase meds or add if over 9% First thing would be to increase glipizide Only intermittent sensory changes in feet now--no Rx needed

## 2015-03-17 NOTE — Assessment & Plan Note (Signed)
Controlled with PPI

## 2015-03-17 NOTE — Assessment & Plan Note (Signed)
Seems to be controlled No ongoing treatment Follows with Dr Jolee Ewing

## 2015-03-17 NOTE — Progress Notes (Signed)
Pre visit review using our clinic review tool, if applicable. No additional management support is needed unless otherwise documented below in the visit note. 

## 2015-03-18 ENCOUNTER — Telehealth: Payer: Self-pay | Admitting: Internal Medicine

## 2015-03-18 NOTE — Telephone Encounter (Signed)
Please mail it with my comments

## 2015-03-18 NOTE — Telephone Encounter (Signed)
Patient is asking for lab report to be mailed to him.

## 2015-03-19 ENCOUNTER — Other Ambulatory Visit: Payer: Self-pay

## 2015-03-19 MED ORDER — GLIPIZIDE 10 MG PO TABS: 10.0000 mg | ORAL_TABLET | Freq: Two times a day (BID) | ORAL | Status: DC

## 2015-03-19 NOTE — Telephone Encounter (Signed)
See lab notes. Labs were mailed to patient

## 2015-03-19 NOTE — Telephone Encounter (Signed)
Patient is asking that we print out the new rx for his glipizide and he will fill it when he runs out of the 5mg . Would like a 90 day supply. Medication built for Dr Silvio Pate to print and sign. I will mail the prescription with his labs.

## 2015-03-19 NOTE — Telephone Encounter (Signed)
Rx was printed and signed. Mailed to patient as requested

## 2015-03-30 DIAGNOSIS — M7711 Lateral epicondylitis, right elbow: Secondary | ICD-10-CM | POA: Diagnosis not present

## 2015-03-30 DIAGNOSIS — M7531 Calcific tendinitis of right shoulder: Secondary | ICD-10-CM | POA: Diagnosis not present

## 2015-06-15 ENCOUNTER — Ambulatory Visit (INDEPENDENT_AMBULATORY_CARE_PROVIDER_SITE_OTHER): Payer: Medicare Other | Admitting: Internal Medicine

## 2015-06-15 ENCOUNTER — Encounter: Payer: Self-pay | Admitting: Internal Medicine

## 2015-06-15 VITALS — BP 106/66 | HR 81 | Temp 98.1°F | Resp 12 | Wt 175.0 lb

## 2015-06-15 DIAGNOSIS — J0140 Acute pansinusitis, unspecified: Secondary | ICD-10-CM | POA: Diagnosis not present

## 2015-06-15 MED ORDER — AMOXICILLIN-POT CLAVULANATE 875-125 MG PO TABS: 1.0000 | ORAL_TABLET | Freq: Two times a day (BID) | ORAL | Status: DC

## 2015-06-15 NOTE — Progress Notes (Signed)
Pre visit review using our clinic review tool, if applicable. No additional management support is needed unless otherwise documented below in the visit note. 

## 2015-06-15 NOTE — Assessment & Plan Note (Signed)
Sick for 3 weeks No signs of lower respiratory tract infection Will treat with augmentin Discussed supportive care

## 2015-06-15 NOTE — Progress Notes (Signed)
Subjective:    Patient ID: Sean Koch, male    DOB: 1940/06/30, 75 y.o.   MRN: [id number]  HPI Here due to respiratory illness Sick for about 3 weeks Started in sinuses with lots of drainage and pressure Then went into chest and was coughing up green sputum--this has persisted Some stomach upset due to this  Feels very tired No fever Some chills and sweats Not SOB No ear pain Occasional sore throat  Started taking zinc tablets--may have helped Then changed to nyquil and robitussin in daytime--helps but overall not resolving  Current Outpatient Prescriptions on File Prior to Visit  Medication Sig Dispense Refill  . aspirin EC 81 MG tablet Take 81 mg by mouth daily.    . Blood Glucose Monitoring Suppl (ONE TOUCH ULTRA SYSTEM KIT) W/DEVICE KIT Use to test blood sugar once daily DX: E11.41 1 each 0  . Calcium Carb-Cholecalciferol 600-800 MG-UNIT TABS Take 2 tablets by mouth daily.     Marland Kitchen glipiZIDE (GLUCOTROL) 10 MG tablet Take 1 tablet (10 mg total) by mouth 2 (two) times daily before a meal. 180 tablet 3  . glucose blood (ONE TOUCH ULTRA TEST) test strip Use as instructed, to test 2-3 times weekly dx: E11.49 100 each 3  . levothyroxine (SYNTHROID, LEVOTHROID) 75 MCG tablet Take 1 tablet by mouth  daily 90 tablet 3  . losartan (COZAAR) 100 MG tablet Take 1 tablet by mouth  daily 90 tablet 3  . metFORMIN (GLUCOPHAGE) 500 MG tablet Take 2 tablets by mouth two times daily with a meal 360 tablet 3  . omeprazole (PRILOSEC) 20 MG capsule Take 1 capsule by mouth  daily 90 capsule 3  . ONE TOUCH LANCETS MISC Use to test blood sugar 2-3 times weekly dx: E11.49 100 each 3  . simvastatin (ZOCOR) 80 MG tablet Take 1 tablet by mouth at  bedtime 90 tablet 3   No current facility-administered medications on file prior to visit.    Allergies  Allergen Reactions  . Atorvastatin     REACTION: Joint aches    Past Medical History  Diagnosis Date  . NIDDM, uncontrolled, with neuropathy     . Hyperlipidemia   . Thyroid disease   . Melanoma ([place])   . GERD (gastroesophageal reflux disease)   . ED (erectile dysfunction)   . History of nephrolithiasis   . Carpal tunnel syndrome of right wrist   . Renal disorder     kidney stones  . Cataract   . Personal history of colonic polyps - 2 small adenomas 07/15/2013  . Seronegative rheumatoid arthritis [facility])     Past Surgical History  Procedure Laterality Date  . Pilonidal cyst / sinus excision    . Knee arthroscopy    . Rotator cuff repair    . Toe surgery    . Cholecystectomy    . Cataract extraction w/ intraocular lens implant Bilateral 11/14 & 2/15  . Melanoma excision      upper right arm    Family History  Problem Relation Age of Onset  . Heart disease Mother   . Diabetes Mother   . Heart disease Brother   . Cancer Neg Hx   . Colon cancer Neg Hx     Social History   Social History  . Marital Status: Married    Spouse Name: N/A  . Number of Children: 2  . Years of Education: N/A   Occupational History  . retired Geneticist, molecular    Social History Main  Topics  . Smoking status: Never Smoker   . Smokeless tobacco: Never Used  . Alcohol Use: Yes     Comment: 1 glass wine monthly per pt.  . Drug Use: No  . Sexual Activity: Not on file   Other Topics Concern  . Not on file   Social History Narrative   Has living will    Wife, then son Reichen, would be health care POA   Would accept resuscitation. No prolonged artificial ventilation.   Not sure about tube feeds   Review of Systems No rash Still eating Felt constipated but able to go yesterday Sugars seem better on the higher glipizide dose    Objective:   Physical Exam  Constitutional: He appears well-developed and well-nourished. No distress.  HENT:  Mouth/Throat: Oropharynx is clear and moist. No oropharyngeal exudate.  No sinus tenderness Marked nasal swelling TMs normal   Neck: Normal range of motion. Neck supple.  Pulmonary/Chest: Effort  normal and breath sounds normal. No respiratory distress. He has no wheezes. He has no rales.  Lymphadenopathy:    He has no cervical adenopathy.          Assessment & Plan:

## 2015-08-26 ENCOUNTER — Other Ambulatory Visit: Payer: Self-pay

## 2015-08-26 MED ORDER — ONETOUCH ULTRA SYSTEM W/DEVICE KIT: PACK | 0 refills | Status: AC

## 2015-09-16 ENCOUNTER — Ambulatory Visit (INDEPENDENT_AMBULATORY_CARE_PROVIDER_SITE_OTHER): Payer: Medicare Other | Admitting: Internal Medicine

## 2015-09-16 ENCOUNTER — Encounter: Payer: Self-pay | Admitting: Internal Medicine

## 2015-09-16 VITALS — BP 102/70 | HR 91 | Temp 98.3°F | Wt 179.0 lb

## 2015-09-16 DIAGNOSIS — E1140 Type 2 diabetes mellitus with diabetic neuropathy, unspecified: Secondary | ICD-10-CM | POA: Diagnosis not present

## 2015-09-16 DIAGNOSIS — E1165 Type 2 diabetes mellitus with hyperglycemia: Secondary | ICD-10-CM | POA: Diagnosis not present

## 2015-09-16 DIAGNOSIS — Z85820 Personal history of malignant melanoma of skin: Secondary | ICD-10-CM

## 2015-09-16 DIAGNOSIS — M170 Bilateral primary osteoarthritis of knee: Secondary | ICD-10-CM | POA: Diagnosis not present

## 2015-09-16 DIAGNOSIS — I10 Essential (primary) hypertension: Secondary | ICD-10-CM | POA: Diagnosis not present

## 2015-09-16 DIAGNOSIS — IMO0002 Reserved for concepts with insufficient information to code with codable children: Secondary | ICD-10-CM

## 2015-09-16 LAB — RENAL FUNCTION PANEL
ALBUMIN: 4.1 g/dL (ref 3.5–5.2)
BUN: 18 mg/dL (ref 6–23)
CHLORIDE: 105 meq/L (ref 96–112)
CO2: 29 meq/L (ref 19–32)
Calcium: 9.3 mg/dL (ref 8.4–10.5)
Creatinine, Ser: 1.11 mg/dL (ref 0.40–1.50)
GFR: 68.2 mL/min (ref >60.00)
Glucose, Bld: 185 mg/dL — ABNORMAL HIGH (ref 70–99)
PHOSPHORUS: 3.7 mg/dL (ref 2.3–4.6)
POTASSIUM: 4.1 meq/L (ref 3.5–5.1)
SODIUM: 141 meq/L (ref 135–145)

## 2015-09-16 LAB — HEMOGLOBIN A1C: HEMOGLOBIN A1C: 8.6 % — AB (ref 4.6–6.5)

## 2015-09-16 NOTE — Assessment & Plan Note (Signed)
Keeps up with derm 

## 2015-09-16 NOTE — Patient Instructions (Signed)
You can try acetaminophen 650-1000mg  twice a day or aleve (naproxen) 1 tab twice a day ---to see if that helps your knee pain.

## 2015-09-16 NOTE — Progress Notes (Signed)
Pre visit review using our clinic review tool, if applicable. No additional management support is needed unless otherwise documented below in the visit note. 

## 2015-09-16 NOTE — Progress Notes (Signed)
Subjective:    Patient ID: Sean Koch, male    DOB: Oct 21, 1940, 75 y.o.   MRN: [id number]  HPI Here for follow up of diabetes and other conditions  He did increase his glipizide dose Checks sugars once a week or so 180-200 mostly, may be higher in the morning Still has leg tingling--but no sig pain  He has no new concerns Wonders about a handicapped parking permit--related to his bad knees He has gotten shots in knees from Dr Jolee Ewing-- didn't help for long No meds for pain Not excited about tylenol but has used aleve in past with some success  On prilosec Notes no heartburn, abd pain or dysphagia  No chest pain Gets SOB with walking --even in parking lot No recent change No dizziness or syncope No edema  Current Outpatient Prescriptions on File Prior to Visit  Medication Sig Dispense Refill  . aspirin EC 81 MG tablet Take 81 mg by mouth daily.    . Blood Glucose Monitoring Suppl (ONE TOUCH ULTRA SYSTEM KIT) w/Device KIT Use to test blood sugar once daily DX: E11.41 1 each 0  . Calcium Carb-Cholecalciferol 600-800 MG-UNIT TABS Take 2 tablets by mouth daily.     Marland Kitchen glipiZIDE (GLUCOTROL) 10 MG tablet Take 1 tablet (10 mg total) by mouth 2 (two) times daily before a meal. 180 tablet 3  . glucose blood (ONE TOUCH ULTRA TEST) test strip Use as instructed, to test 2-3 times weekly dx: E11.49 100 each 3  . levothyroxine (SYNTHROID, LEVOTHROID) 75 MCG tablet Take 1 tablet by mouth  daily 90 tablet 3  . losartan (COZAAR) 100 MG tablet Take 1 tablet by mouth  daily 90 tablet 3  . metFORMIN (GLUCOPHAGE) 500 MG tablet Take 2 tablets by mouth two times daily with a meal 360 tablet 3  . omeprazole (PRILOSEC) 20 MG capsule Take 1 capsule by mouth  daily 90 capsule 3  . ONE TOUCH LANCETS MISC Use to test blood sugar 2-3 times weekly dx: E11.49 100 each 3  . simvastatin (ZOCOR) 80 MG tablet Take 1 tablet by mouth at  bedtime 90 tablet 3   No current facility-administered medications  on file prior to visit.     Allergies  Allergen Reactions  . Atorvastatin     REACTION: Joint aches    Past Medical History:  Diagnosis Date  . Carpal tunnel syndrome of right wrist   . Cataract   . ED (erectile dysfunction)   . GERD (gastroesophageal reflux disease)   . History of nephrolithiasis   . Hyperlipidemia   . Melanoma ([place])   . NIDDM, uncontrolled, with neuropathy   . Personal history of colonic polyps - 2 small adenomas 07/15/2013  . Renal disorder    kidney stones  . Seronegative rheumatoid arthritis ([place])   . Thyroid disease     Past Surgical History:  Procedure Laterality Date  . CATARACT EXTRACTION W/ INTRAOCULAR LENS IMPLANT Bilateral 11/14 & 2/15  . CHOLECYSTECTOMY    . KNEE ARTHROSCOPY    . MELANOMA EXCISION     upper right arm  . PILONIDAL CYST / SINUS EXCISION    . ROTATOR CUFF REPAIR    . TOE SURGERY      Family History  Problem Relation Age of Onset  . Heart disease Mother   . Diabetes Mother   . Heart disease Brother   . Cancer Neg Hx   . Colon cancer Neg Hx     Social History  Social History  . Marital status: Married    Spouse name: N/A  . Number of children: 2  . Years of education: N/A   Occupational History  . retired Geneticist, molecular    Social History Main Topics  . Smoking status: Never Smoker  . Smokeless tobacco: Never Used  . Alcohol use Yes     Comment: 1 glass wine monthly per pt.  . Drug use: No  . Sexual activity: Not on file   Other Topics Concern  . Not on file   Social History Narrative   Has living will    Wife, then son Macallister, would be health care POA   Would accept resuscitation. No prolonged artificial ventilation.   Not sure about tube feeds    Current Outpatient Prescriptions on File Prior to Visit  Medication Sig Dispense Refill  . aspirin EC 81 MG tablet Take 81 mg by mouth daily.    . Blood Glucose Monitoring Suppl (ONE TOUCH ULTRA SYSTEM KIT) w/Device KIT Use to test blood sugar once daily DX:  E11.41 1 each 0  . Calcium Carb-Cholecalciferol 600-800 MG-UNIT TABS Take 2 tablets by mouth daily.     Marland Kitchen glipiZIDE (GLUCOTROL) 10 MG tablet Take 1 tablet (10 mg total) by mouth 2 (two) times daily before a meal. 180 tablet 3  . glucose blood (ONE TOUCH ULTRA TEST) test strip Use as instructed, to test 2-3 times weekly dx: E11.49 100 each 3  . levothyroxine (SYNTHROID, LEVOTHROID) 75 MCG tablet Take 1 tablet by mouth  daily 90 tablet 3  . losartan (COZAAR) 100 MG tablet Take 1 tablet by mouth  daily 90 tablet 3  . metFORMIN (GLUCOPHAGE) 500 MG tablet Take 2 tablets by mouth two times daily with a meal 360 tablet 3  . omeprazole (PRILOSEC) 20 MG capsule Take 1 capsule by mouth  daily 90 capsule 3  . ONE TOUCH LANCETS MISC Use to test blood sugar 2-3 times weekly dx: E11.49 100 each 3  . simvastatin (ZOCOR) 80 MG tablet Take 1 tablet by mouth at  bedtime 90 tablet 3   No current facility-administered medications on file prior to visit.     Allergies  Allergen Reactions  . Atorvastatin     REACTION: Joint aches    Past Medical History:  Diagnosis Date  . Carpal tunnel syndrome of right wrist   . Cataract   . ED (erectile dysfunction)   . GERD (gastroesophageal reflux disease)   . History of nephrolithiasis   . Hyperlipidemia   . Melanoma ([place])   . NIDDM, uncontrolled, with neuropathy   . Personal history of colonic polyps - 2 small adenomas 07/15/2013  . Renal disorder    kidney stones  . Seronegative rheumatoid arthritis ([place])   . Thyroid disease     Past Surgical History:  Procedure Laterality Date  . CATARACT EXTRACTION W/ INTRAOCULAR LENS IMPLANT Bilateral 11/14 & 2/15  . CHOLECYSTECTOMY    . KNEE ARTHROSCOPY    . MELANOMA EXCISION     upper right arm  . PILONIDAL CYST / SINUS EXCISION    . ROTATOR CUFF REPAIR    . TOE SURGERY      Family History  Problem Relation Age of Onset  . Heart disease Mother   . Diabetes Mother   . Heart disease Brother   . Cancer Neg Hx    . Colon cancer Neg Hx     Social History   Social History  . Marital status: Married  Spouse name: N/A  . Number of children: 2  . Years of education: N/A   Occupational History  . retired Geneticist, molecular    Social History Main Topics  . Smoking status: Never Smoker  . Smokeless tobacco: Never Used  . Alcohol use Yes     Comment: 1 glass wine monthly per pt.  . Drug use: No  . Sexual activity: Not on file   Other Topics Concern  . Not on file   Social History Narrative   Has living will    Wife, then son Aldin, would be health care POA   Would accept resuscitation. No prolonged artificial ventilation.   Not sure about tube feeds   Review of Systems Sleeps okay in bed-- no PND Nocturia x 1-2--stable Appetite is okay Weight fairly stable Sees derm once a year    Objective:   Physical Exam  Constitutional: He appears well-developed and well-nourished. No distress.  Neck: Normal range of motion. Neck supple. No thyromegaly present.  Cardiovascular: Normal rate, regular rhythm and normal heart sounds.  Exam reveals no gallop.   No murmur heard. Faint pedal pulses  Pulmonary/Chest: Effort normal and breath sounds normal. No respiratory distress. He has no wheezes. He has no rales.  Abdominal: Soft. There is no tenderness.  Musculoskeletal:  No knee effusions  Lymphadenopathy:    He has no cervical adenopathy.  Skin:  No foot lesions  Psychiatric: He has a normal mood and affect. His behavior is normal.          Assessment & Plan:

## 2015-09-16 NOTE — Assessment & Plan Note (Signed)
BP Readings from Last 3 Encounters:  09/16/15 102/70  06/15/15 106/66  03/17/15 108/66   Good control

## 2015-09-16 NOTE — Assessment & Plan Note (Signed)
Limited activity Discussed trying aleve bid (recheck renal function first) Needs to work on fitness Might need to consider handicapped permit

## 2015-09-16 NOTE — Assessment & Plan Note (Signed)
Hopefully better control No major pain in feet--will hold off on Rx

## 2015-12-16 DIAGNOSIS — Z85820 Personal history of malignant melanoma of skin: Secondary | ICD-10-CM | POA: Diagnosis not present

## 2015-12-16 DIAGNOSIS — L814 Other melanin hyperpigmentation: Secondary | ICD-10-CM | POA: Diagnosis not present

## 2015-12-16 DIAGNOSIS — L820 Inflamed seborrheic keratosis: Secondary | ICD-10-CM | POA: Diagnosis not present

## 2015-12-16 DIAGNOSIS — L821 Other seborrheic keratosis: Secondary | ICD-10-CM | POA: Diagnosis not present

## 2015-12-16 DIAGNOSIS — D225 Melanocytic nevi of trunk: Secondary | ICD-10-CM | POA: Diagnosis not present

## 2015-12-16 DIAGNOSIS — L570 Actinic keratosis: Secondary | ICD-10-CM | POA: Diagnosis not present

## 2015-12-16 DIAGNOSIS — Z85828 Personal history of other malignant neoplasm of skin: Secondary | ICD-10-CM | POA: Diagnosis not present

## 2015-12-24 ENCOUNTER — Other Ambulatory Visit: Payer: Self-pay | Admitting: Internal Medicine

## 2016-02-04 DIAGNOSIS — L570 Actinic keratosis: Secondary | ICD-10-CM | POA: Diagnosis not present

## 2016-02-04 DIAGNOSIS — L739 Follicular disorder, unspecified: Secondary | ICD-10-CM | POA: Diagnosis not present

## 2016-02-04 DIAGNOSIS — B078 Other viral warts: Secondary | ICD-10-CM | POA: Diagnosis not present

## 2016-02-04 DIAGNOSIS — D485 Neoplasm of uncertain behavior of skin: Secondary | ICD-10-CM | POA: Diagnosis not present

## 2016-02-04 DIAGNOSIS — L821 Other seborrheic keratosis: Secondary | ICD-10-CM | POA: Diagnosis not present

## 2016-02-11 ENCOUNTER — Other Ambulatory Visit: Payer: Self-pay | Admitting: Internal Medicine

## 2016-02-18 DIAGNOSIS — M545 Low back pain: Secondary | ICD-10-CM | POA: Diagnosis not present

## 2016-02-18 DIAGNOSIS — M25512 Pain in left shoulder: Secondary | ICD-10-CM | POA: Diagnosis not present

## 2016-02-18 DIAGNOSIS — M5416 Radiculopathy, lumbar region: Secondary | ICD-10-CM | POA: Diagnosis not present

## 2016-03-23 ENCOUNTER — Encounter: Payer: Self-pay | Admitting: Internal Medicine

## 2016-03-23 ENCOUNTER — Ambulatory Visit (INDEPENDENT_AMBULATORY_CARE_PROVIDER_SITE_OTHER): Payer: Medicare Other | Admitting: Internal Medicine

## 2016-03-23 VITALS — BP 126/64 | HR 107 | Temp 97.8°F | Wt 179.2 lb

## 2016-03-23 DIAGNOSIS — I10 Essential (primary) hypertension: Secondary | ICD-10-CM

## 2016-03-23 DIAGNOSIS — E1140 Type 2 diabetes mellitus with diabetic neuropathy, unspecified: Secondary | ICD-10-CM

## 2016-03-23 DIAGNOSIS — E039 Hypothyroidism, unspecified: Secondary | ICD-10-CM

## 2016-03-23 DIAGNOSIS — M0600 Rheumatoid arthritis without rheumatoid factor, unspecified site: Secondary | ICD-10-CM

## 2016-03-23 DIAGNOSIS — Z23 Encounter for immunization: Secondary | ICD-10-CM

## 2016-03-23 DIAGNOSIS — E1165 Type 2 diabetes mellitus with hyperglycemia: Secondary | ICD-10-CM

## 2016-03-23 DIAGNOSIS — IMO0002 Reserved for concepts with insufficient information to code with codable children: Secondary | ICD-10-CM

## 2016-03-23 LAB — COMPREHENSIVE METABOLIC PANEL
ALBUMIN: 4.5 g/dL (ref 3.5–5.2)
ALK PHOS: 68 U/L (ref 39–117)
ALT: 11 U/L (ref 0–53)
AST: 15 U/L (ref 0–37)
BUN: 21 mg/dL (ref 6–23)
CHLORIDE: 102 meq/L (ref 96–112)
CO2: 27 mEq/L (ref 19–32)
Calcium: 10.4 mg/dL (ref 8.4–10.5)
Creatinine, Ser: 1.27 mg/dL (ref 0.40–1.50)
GFR: 58.3 mL/min — ABNORMAL LOW (ref >60.00)
Glucose, Bld: 316 mg/dL — ABNORMAL HIGH (ref 70–99)
POTASSIUM: 4.3 meq/L (ref 3.5–5.1)
Sodium: 140 mEq/L (ref 135–145)
TOTAL PROTEIN: 7.5 g/dL (ref 6.0–8.3)
Total Bilirubin: 0.8 mg/dL (ref 0.2–1.2)

## 2016-03-23 LAB — CBC WITH DIFFERENTIAL/PLATELET
BASOS ABS: 0.1 10*3/uL (ref 0.0–0.1)
BASOS PCT: 0.6 % (ref 0.0–3.0)
EOS PCT: 3.3 % (ref 0.0–5.0)
Eosinophils Absolute: 0.4 10*3/uL (ref 0.0–0.7)
HCT: 47.3 % (ref 39.0–52.0)
Hemoglobin: 15.5 g/dL (ref 13.0–17.0)
LYMPHS PCT: 16.4 % (ref 12.0–46.0)
Lymphs Abs: 1.8 10*3/uL (ref 0.7–4.0)
MCHC: 32.8 g/dL (ref 30.0–36.0)
MCV: 92.7 fl (ref 78.0–100.0)
Monocytes Absolute: 0.6 10*3/uL (ref 0.1–1.0)
Monocytes Relative: 5.1 % (ref 3.0–12.0)
NEUTROS PCT: 74.6 % (ref 43.0–77.0)
Neutro Abs: 8.3 10*3/uL — ABNORMAL HIGH (ref 1.4–7.7)
PLATELETS: 236 10*3/uL (ref 150.0–400.0)
RBC: 5.1 Mil/uL (ref 4.22–5.81)
RDW: 13.4 % (ref 11.5–15.5)
WBC: 11.1 10*3/uL — AB (ref 4.0–10.5)

## 2016-03-23 LAB — POC URINALSYSI DIPSTICK (AUTOMATED)
Bilirubin, UA: NEGATIVE
Ketones, UA: NEGATIVE
Leukocytes, UA: NEGATIVE
NITRITE UA: NEGATIVE
Protein, UA: NEGATIVE
RBC UA: NEGATIVE
Spec Grav, UA: 1.03
UROBILINOGEN UA: 0.2
pH, UA: 5.5

## 2016-03-23 LAB — TSH: TSH: 3.37 u[IU]/mL (ref 0.35–4.50)

## 2016-03-23 LAB — LIPID PANEL
CHOL/HDL RATIO: 4
Cholesterol: 151 mg/dL (ref 0–200)
HDL: 36.2 mg/dL — ABNORMAL LOW (ref >39.00)
LDL Cholesterol: 78 mg/dL (ref 0–99)
NonHDL: 114.53
TRIGLYCERIDES: 181 mg/dL — AB (ref 0.0–149.0)
VLDL: 36.2 mg/dL (ref 0.0–40.0)

## 2016-03-23 LAB — HM DIABETES FOOT EXAM

## 2016-03-23 LAB — T4, FREE: Free T4: 1.15 ng/dL (ref 0.60–1.60)

## 2016-03-23 LAB — HEMOGLOBIN A1C: Hgb A1c MFr Bld: 9.9 % — ABNORMAL HIGH (ref 4.6–6.5)

## 2016-03-23 MED ORDER — METFORMIN HCL 500 MG PO TABS: ORAL_TABLET | ORAL | 2 refills | Status: DC

## 2016-03-23 MED ORDER — GLIPIZIDE 10 MG PO TABS: ORAL_TABLET | ORAL | 2 refills | Status: DC

## 2016-03-23 MED ORDER — LEVOTHYROXINE SODIUM 75 MCG PO TABS: 75.0000 ug | ORAL_TABLET | Freq: Every day | ORAL | 2 refills | Status: DC

## 2016-03-23 MED ORDER — SIMVASTATIN 80 MG PO TABS: 80.0000 mg | ORAL_TABLET | Freq: Every day | ORAL | 2 refills | Status: DC

## 2016-03-23 MED ORDER — SITAGLIPTIN PHOSPHATE 100 MG PO TABS: 100.0000 mg | ORAL_TABLET | Freq: Every day | ORAL | 3 refills | Status: DC

## 2016-03-23 MED ORDER — LOSARTAN POTASSIUM 100 MG PO TABS: 100.0000 mg | ORAL_TABLET | Freq: Every day | ORAL | 2 refills | Status: DC

## 2016-03-23 MED ORDER — OMEPRAZOLE 20 MG PO CPDR: 20.0000 mg | DELAYED_RELEASE_CAPSULE | Freq: Every day | ORAL | 3 refills | Status: DC

## 2016-03-23 NOTE — Assessment & Plan Note (Signed)
Has been controlled without meds

## 2016-03-23 NOTE — Assessment & Plan Note (Signed)
Seems euthyroid

## 2016-03-23 NOTE — Addendum Note (Signed)
Addended by: Modena Nunnery on: 03/23/2016 11:08 AM   Modules accepted: Orders

## 2016-03-23 NOTE — Assessment & Plan Note (Addendum)
Ongoing suboptimal control Has urinary incontinence at times---probably due to glycosuria Will add [place] Prefers no med yet for neuropathy

## 2016-03-23 NOTE — Progress Notes (Signed)
Pre visit review using our clinic review tool, if applicable. No additional management support is needed unless otherwise documented below in the visit note. 

## 2016-03-23 NOTE — Progress Notes (Signed)
Subjective:    Patient ID: Sean Koch, male    DOB: Oct 08, 1940, 76 y.o.   MRN: [id number]  HPI Here for follow up of diabetes and other chronic health conditions  He gets tired at times [place] also--legs don't "want to go where I want them to" No falls but unstable at times  Checks sugars sporadically Fasting sugars still 180-190 Ongoing leg pain/neuropathy Careful with eating  No chest pain No palpitations No SOB--- but does have stable DOE if he pushes it No dizziness or syncope  Takes the omeprazole daily No heartburn No dysphagia  Had spell of urinary incontinence--first last June Sporadic and is better with being careful  Rheumatologist is retiring Discussed alternatives Still not on Rx at this point  Current Outpatient Prescriptions on File Prior to Visit  Medication Sig Dispense Refill  . aspirin EC 81 MG tablet Take 81 mg by mouth daily.    . Blood Glucose Monitoring Suppl (ONE TOUCH ULTRA SYSTEM KIT) w/Device KIT Use to test blood sugar once daily DX: E11.41 1 each 0  . Calcium Carb-Cholecalciferol 600-800 MG-UNIT TABS Take 2 tablets by mouth daily.     Marland Kitchen glipiZIDE (GLUCOTROL) 10 MG tablet TAKE 1 TABLET BY MOUTH  TWICE A DAY BEFORE A MEAL 180 tablet 0  . glucose blood (ONE TOUCH ULTRA TEST) test strip Use as instructed, to test 2-3 times weekly dx: E11.49 100 each 3  . levothyroxine (SYNTHROID, LEVOTHROID) 75 MCG tablet TAKE 1 TABLET BY MOUTH  DAILY 90 tablet 0  . losartan (COZAAR) 100 MG tablet TAKE 1 TABLET BY MOUTH  DAILY 90 tablet 2  . metFORMIN (GLUCOPHAGE) 500 MG tablet TAKE 2 TABLETS BY MOUTH TWO TIMES DAILY WITH A MEAL 360 tablet 1  . omeprazole (PRILOSEC) 20 MG capsule TAKE 1 CAPSULE BY MOUTH  DAILY 90 capsule 3  . ONE TOUCH LANCETS MISC Use to test blood sugar 2-3 times weekly dx: E11.49 100 each 3  . simvastatin (ZOCOR) 80 MG tablet TAKE 1 TABLET BY MOUTH AT  BEDTIME 90 tablet 0   No current facility-administered medications on file prior to  visit.     Allergies  Allergen Reactions  . Atorvastatin     REACTION: Joint aches    Past Medical History:  Diagnosis Date  . Carpal tunnel syndrome of right wrist   . Cataract   . ED (erectile dysfunction)   . GERD (gastroesophageal reflux disease)   . History of nephrolithiasis   . Hyperlipidemia   . Melanoma ([place])   . NIDDM, uncontrolled, with neuropathy   . Personal history of colonic polyps - 2 small adenomas 07/15/2013  . Renal disorder    kidney stones  . Seronegative rheumatoid arthritis ([place])   . Thyroid disease     Past Surgical History:  Procedure Laterality Date  . CATARACT EXTRACTION W/ INTRAOCULAR LENS IMPLANT Bilateral 11/14 & 2/15  . CHOLECYSTECTOMY    . KNEE ARTHROSCOPY    . MELANOMA EXCISION     upper right arm  . PILONIDAL CYST / SINUS EXCISION    . ROTATOR CUFF REPAIR    . TOE SURGERY      Family History  Problem Relation Age of Onset  . Heart disease Mother   . Diabetes Mother   . Heart disease Brother   . Cancer Neg Hx   . Colon cancer Neg Hx     Social History   Social History  . Marital status: Married    Spouse name:  N/A  . Number of children: 2  . Years of education: N/A   Occupational History  . retired Geneticist, molecular    Social History Main Topics  . Smoking status: Never Smoker  . Smokeless tobacco: Never Used  . Alcohol use Yes     Comment: 1 glass wine monthly per pt.  . Drug use: No  . Sexual activity: Not on file   Other Topics Concern  . Not on file   Social History Narrative   Has living will    Wife, then son Koji, would be health care POA   Would accept resuscitation. No prolonged artificial ventilation.   Not sure about tube feeds   Review of Systems  Weight stable Sleeps well till change in time--now back to normal Wears seat belt     Objective:   Physical Exam  Neck: No thyromegaly present.  Cardiovascular: Normal rate, regular rhythm, normal heart sounds and intact distal pulses.  Exam reveals no  gallop.   No murmur heard. Pulmonary/Chest: Effort normal and breath sounds normal. No respiratory distress. He has no wheezes. He has no rales.  Musculoskeletal:  No active synovitis  Lymphadenopathy:    He has no cervical adenopathy.  Neurological:  Decreased sensation in feet  Skin:  No foot lesions  Psychiatric: He has a normal mood and affect. His behavior is normal.          Assessment & Plan:

## 2016-03-23 NOTE — Assessment & Plan Note (Signed)
BP Readings from Last 3 Encounters:  03/23/16 126/64  09/16/15 102/70  06/15/15 106/66   Good control

## 2016-03-24 ENCOUNTER — Encounter: Payer: Self-pay | Admitting: *Deleted

## 2016-05-04 ENCOUNTER — Telehealth: Payer: Self-pay

## 2016-05-04 ENCOUNTER — Ambulatory Visit (INDEPENDENT_AMBULATORY_CARE_PROVIDER_SITE_OTHER)
Admission: RE | Admit: 2016-05-04 | Discharge: 2016-05-04 | Disposition: A | Discharge status: Home / Self Care | Payer: Medicare Other | Source: Ambulatory Visit | Attending: Family Medicine | Admitting: Family Medicine

## 2016-05-04 ENCOUNTER — Encounter: Payer: Self-pay | Admitting: Family Medicine

## 2016-05-04 ENCOUNTER — Ambulatory Visit
Admission: RE | Admit: 2016-05-04 | Discharge: 2016-05-04 | Disposition: A | Discharge status: Home / Self Care | Payer: Medicare Other | Source: Ambulatory Visit | Attending: Family Medicine | Admitting: Family Medicine

## 2016-05-04 ENCOUNTER — Ambulatory Visit (INDEPENDENT_AMBULATORY_CARE_PROVIDER_SITE_OTHER): Payer: Medicare Other | Admitting: Family Medicine

## 2016-05-04 VITALS — BP 100/60 | HR 81 | Temp 98.4°F | Ht 67.0 in | Wt 180.5 lb

## 2016-05-04 DIAGNOSIS — M79604 Pain in right leg: Secondary | ICD-10-CM | POA: Diagnosis not present

## 2016-05-04 DIAGNOSIS — M1611 Unilateral primary osteoarthritis, right hip: Secondary | ICD-10-CM | POA: Diagnosis not present

## 2016-05-04 DIAGNOSIS — M1711 Unilateral primary osteoarthritis, right knee: Secondary | ICD-10-CM | POA: Diagnosis not present

## 2016-05-04 DIAGNOSIS — M0600 Rheumatoid arthritis without rheumatoid factor, unspecified site: Secondary | ICD-10-CM | POA: Diagnosis not present

## 2016-05-04 NOTE — Telephone Encounter (Signed)
noted 

## 2016-05-04 NOTE — Patient Instructions (Signed)

## 2016-05-04 NOTE — Telephone Encounter (Addendum)
Maria with [facility] [place] called report preliminary; US Venous Lower rt leg; negative; pt waiting. Verdis Frederickson will get pt to phone and Verdis Frederickson said final report by radiologist is in [place] and report is still negative. Dr Lorelei Pont notified and said to tell pt no clot seen and pt may go. Pt notified as instructed and voiced understanding. FYI to Dr Lorelei Pont.

## 2016-05-04 NOTE — Progress Notes (Signed)
Dr. Frederico Hamman T. Priyansh Pry, MD, [facility] [place], [address] Phone: [phone] Fax: [phone]  05/04/2016  Patient: Sean Koch, MRN: [id number], DOB: 01-Jan-1941, 76 y.o.  Primary Physician:  Viviana Simpler, MD   Chief Complaint  Patient presents with  . Leg Pain    Right   Subjective:   Sean Koch is a 76 y.o. very pleasant male patient who presents with the following:  At the bottom of his buttocks, elt a sharp pain and then it will go away. Then it is in his knee and in the knee. Has been ongoing for 1-2 weeks. He has not had any specific injury that he can recall.  He also has some pain at the knee, and has had some pain radiating down the posterior of his buttocks down to the knee region.  Relevant medical history includes poorly controlled diabetes, rheumatoid arthritis.  Hip waas hurting, knee was hurting.   Lab Results  Component Value Date   HGBA1C 9.9 (H) 03/23/2016      Past Medical History, Surgical History, Social History, Family History, Problem List, Medications, and Allergies have been reviewed and updated if relevant.  Patient Active Problem List   Diagnosis Date Noted  . Advance directive discussed with patient 03/26/2014  . Seronegative rheumatoid arthritis ([place])   . BPPV (benign paroxysmal positional vertigo) 09/26/2013  . Arthritis of hand 09/10/2013  . Internal hemorrhoids without mention of complication [id number]/42/8768  . Personal history of colonic polyps - 2 small adenomas 07/15/2013  . Diabetes, polyneuropathy ([place]) 03/13/2013  . Unspecified constipation 03/01/2012  . Osteoarthritis, knee 05/23/2011  . Routine general medical examination at a health care facility 03/14/2011  . Essential hypertension, benign 03/02/2010  . NEPHROLITHIASIS, HX OF 02/28/2007  . Type 2 diabetes mellitus with neurological manifestations, uncontrolled ([place]) 08/26/2006  . History of melanoma  08/22/2006  . Hypothyroidism 08/22/2006  . Hyperlipemia 08/22/2006  . ERECTILE DYSFUNCTION 08/22/2006  . Reflux esophagitis 08/22/2006    Past Medical History:  Diagnosis Date  . Carpal tunnel syndrome of right wrist   . Cataract   . ED (erectile dysfunction)   . GERD (gastroesophageal reflux disease)   . History of nephrolithiasis   . Hyperlipidemia   . Melanoma ([place])   . NIDDM, uncontrolled, with neuropathy   . Personal history of colonic polyps - 2 small adenomas 07/15/2013  . Renal disorder    kidney stones  . Seronegative rheumatoid arthritis ([place])   . Thyroid disease     Past Surgical History:  Procedure Laterality Date  . CATARACT EXTRACTION W/ INTRAOCULAR LENS IMPLANT Bilateral 11/14 & 2/15  . CHOLECYSTECTOMY    . KNEE ARTHROSCOPY    . MELANOMA EXCISION     upper right arm  . PILONIDAL CYST / SINUS EXCISION    . ROTATOR CUFF REPAIR    . TOE SURGERY      Social History   Social History  . Marital status: Married    Spouse name: N/A  . Number of children: 2  . Years of education: N/A   Occupational History  . retired Geneticist, molecular    Social History Main Topics  . Smoking status: Never Smoker  . Smokeless tobacco: Never Used  . Alcohol use Yes     Comment: 1 glass wine monthly per pt.  . Drug use: No  . Sexual activity: Not on file   Other Topics Concern  . Not on file  Social History Narrative   Has living will    Wife, then son Damyen, would be health care POA   Would accept resuscitation. No prolonged artificial ventilation.   Not sure about tube feeds    Family History  Problem Relation Age of Onset  . Heart disease Mother   . Diabetes Mother   . Heart disease Brother   . Cancer Neg Hx   . Colon cancer Neg Hx     Allergies  Allergen Reactions  . Atorvastatin     REACTION: Joint aches    Medication list reviewed and updated in full in [place].  GEN: No fevers, chills. Nontoxic. Primarily MSK c/o today. MSK: Detailed in the  HPI GI: tolerating PO intake without difficulty Neuro: No numbness, parasthesias, or tingling associated. Otherwise the pertinent positives of the ROS are noted above.   Objective:   BP 100/60   Pulse 81   Temp 98.4 F (36.9 C) (Oral)   Ht 5' 7"  (1.702 m)   Wt 180 lb 8 oz (81.9 kg)   BMI 28.27 kg/m    GEN: WDWN, NAD, Non-toxic, Alert & Oriented x 3 HEENT: Atraumatic, Normocephalic.  Ears and Nose: No external deformity. EXTR: No clubbing/cyanosis/edema NEURO: Normal gait.  PSYCH: Normally interactive. Conversant. Not depressed or anxious appearing.  Calm demeanor.   HIP EXAM: SIDE: R ROM: Abduction, Flexion, Internal and External range of motion: 15 of abduction.  Approximately 10 of total motion of internal and external rotation with the hip flexed to 90. Pain with terminal IROM and EROM: mild to moderate GTB: NT SLR: NEG Tenderness to palpation at the ischial tuberosity Knees: No effusion FABER: NT REVERSE FABER: NT, neg Piriformis: NT at direct palpation Str: flexion: 5/5 abduction: 5/5 adduction: 5/5 Strength testing non-tender   right knee lacks 2 of extension.  No significant effusion.  Flexion to 110.  Mild tenderness along the medial and lateral joint lines.  Pain with flexion pinch.  McMurray's causes pain only.  No pain with bounce home test.  Radiology: US Venous Img Lower Unilateral Right  Result Date: 05/04/2016 CLINICAL DATA:  Right lower extremity pain EXAM: RIGHT LOWER EXTREMITY VENOUS DUPLEX ULTRASOUND TECHNIQUE: Gray-scale sonography with graded compression, as well as color Doppler and duplex ultrasound were performed to evaluate the right lower extremity deep venous system from the level of the common femoral vein and including the common femoral, femoral, profunda femoral, popliteal and calf veins including the posterior tibial, peroneal and gastrocnemius veins when visible. The superficial great saphenous vein was also interrogated. Spectral Doppler  was utilized to evaluate flow at rest and with distal augmentation maneuvers in the common femoral, femoral and popliteal veins. COMPARISON:  None. FINDINGS: Contralateral Common Femoral Vein: Respiratory phasicity is normal and symmetric with the symptomatic side. No evidence of thrombus. Normal compressibility. Common Femoral Vein: No evidence of thrombus. Normal compressibility, respiratory phasicity and response to augmentation. Saphenofemoral Junction: No evidence of thrombus. Normal compressibility and flow on color Doppler imaging. Profunda Femoral Vein: No evidence of thrombus. Normal compressibility and flow on color Doppler imaging. Femoral Vein: No evidence of thrombus. Normal compressibility, respiratory phasicity and response to augmentation. Popliteal Vein: No evidence of thrombus. Normal compressibility, respiratory phasicity and response to augmentation. Calf Veins: No evidence of thrombus. Normal compressibility and flow on color Doppler imaging. Superficial Great Saphenous Vein: No evidence of thrombus. Normal compressibility and flow on color Doppler imaging. Venous Reflux:  None. Other Findings:  None. IMPRESSION: No evidence of  right lower extremity deep venous thrombosis. Left common femoral vein also patent. Electronically Signed   By: Lowella Grip III M.D.   On: 05/04/2016 14:42   Dg Knee 4 Views W/patella Right  Result Date: 05/04/2016 CLINICAL DATA:  Right hip pain, knee pain EXAM: RIGHT KNEE - COMPLETE 4+ VIEW COMPARISON:  None. FINDINGS: No acute fracture or dislocation. Generalized osteopenia. Moderate right medial femorotibial compartment joint space narrowing. Severe left medial femorotibial compartment joint space narrowing. No significant right knee joint effusion. IMPRESSION: 1.  No acute osseous injury of the right knee. 2. Moderate right medial femorotibial compartment osteoarthritis. 3. Severe left medial femorotibial compartment osteoarthritis. Electronically Signed    By: Kathreen Devoid   On: 05/04/2016 15:43   Dg Hip Unilat With Pelvis 2-3 Views Right  Result Date: 05/04/2016 CLINICAL DATA:  Right hip and knee pain. EXAM: DG HIP (WITH OR WITHOUT PELVIS) 2-3V RIGHT COMPARISON:  CT 02/14/2012 . FINDINGS: Degenerative changes lumbar spine and both hips. Bilateral ligamentous ossification noted along the lower pelvis. No acute bony or joint abnormality. Vascular calcification. IMPRESSION: 1. Degenerative changes lumbar spine and both hips. No acute bony abnormality. 2.  Atherosclerotic vascular disease. Electronically Signed   By: Marcello Moores  Register   On: 05/04/2016 15:41     Assessment and Plan:   Right leg pain - Plan: US Venous Img Lower Unilateral Right, DG HIP UNILAT WITH PELVIS 2-3 VIEWS RIGHT, DG Knee 4 Views W/Patella Right  Primary osteoarthritis of right knee  Primary osteoarthritis of right hip  Seronegative rheumatoid arthritis ([place])  >25 minutes spent in face to face time with patient, >50% spent in counselling or coordination of care   Moderate arthritis of the right knee.  Left knee has advanced osteoarthritis, particularly in the medial compartment.  Moderate arthritis of the right hip.  No DVT is present  Given clinical signs and symptoms, I think that sciatica is more likely cause of majority of his symptoms.  He does have an A1c of 10.  Painful at the initial tuberosity.  We reviewed some rehabilitation that would help with piriformis syndrome in the hamstring syndrome.  Follow-up: if needed  Orders Placed This Encounter  Procedures  . US Venous Img Lower Unilateral Right  . DG HIP UNILAT WITH PELVIS 2-3 VIEWS RIGHT  . DG Knee 4 Views W/Patella Right    Signed,  Frederico Hamman T. Krysia Zahradnik, MD   Allergies as of 05/04/2016      Reactions   Atorvastatin    REACTION: Joint aches      Medication List       Accurate as of 05/04/16 11:59 PM. Always use your most recent med list.          aspirin EC 81 MG tablet Take 81 mg by  mouth daily.   Calcium Carb-Cholecalciferol 600-800 MG-UNIT Tabs Take 2 tablets by mouth daily.   glipiZIDE 10 MG tablet Commonly known as:  GLUCOTROL TAKE 1 TABLET BY MOUTH  TWICE A DAY BEFORE A MEAL   glucose blood test strip Commonly known as:  ONE TOUCH ULTRA TEST Use as instructed, to test 2-3 times weekly dx: E11.49   levothyroxine 75 MCG tablet Commonly known as:  SYNTHROID, LEVOTHROID Take 1 tablet (75 mcg total) by mouth daily.   losartan 100 MG tablet Commonly known as:  COZAAR Take 1 tablet (100 mg total) by mouth daily.   metFORMIN 500 MG tablet Commonly known as:  GLUCOPHAGE TAKE 2 TABLETS BY MOUTH TWO TIMES DAILY  WITH A MEAL   omeprazole 20 MG capsule Commonly known as:  PRILOSEC Take 1 capsule (20 mg total) by mouth daily.   ONE TOUCH LANCETS Misc Use to test blood sugar 2-3 times weekly dx: E11.49   ONE TOUCH ULTRA SYSTEM KIT w/Device Kit Use to test blood sugar once daily DX: E11.41   simvastatin 80 MG tablet Commonly known as:  ZOCOR Take 1 tablet (80 mg total) by mouth at bedtime.   sitaGLIPtin 100 MG tablet Commonly known as:  JANUVIA Take 1 tablet (100 mg total) by mouth daily.

## 2016-05-04 NOTE — Progress Notes (Signed)
Pre visit review using our clinic review tool, if applicable. No additional management support is needed unless otherwise documented below in the visit note. 

## 2016-05-31 ENCOUNTER — Ambulatory Visit (INDEPENDENT_AMBULATORY_CARE_PROVIDER_SITE_OTHER): Payer: Medicare Other | Admitting: Family Medicine

## 2016-05-31 ENCOUNTER — Encounter: Payer: Self-pay | Admitting: Family Medicine

## 2016-05-31 VITALS — BP 108/60 | HR 74 | Temp 98.6°F | Wt 181.5 lb

## 2016-05-31 DIAGNOSIS — M5431 Sciatica, right side: Secondary | ICD-10-CM

## 2016-05-31 DIAGNOSIS — M5430 Sciatica, unspecified side: Secondary | ICD-10-CM

## 2016-05-31 MED ORDER — TRAMADOL HCL 50 MG PO TABS: 50.0000 mg | ORAL_TABLET | Freq: Three times a day (TID) | ORAL | 0 refills | Status: DC | PRN

## 2016-05-31 NOTE — Patient Instructions (Addendum)
Sean Koch will call about your referral. Start taking tylenol for pain, up to 2 tabs 3 times a day.  If still in pain, then use tramadol.  Sedation caution.  Take care.  Glad to see you.

## 2016-05-31 NOTE — Progress Notes (Signed)
Prev eval by Dr. Lorelei Pont with R buttock and leg pain.  He was checked DVT, neg eval.  Known arthritis with prev imaging.  Still with radicular R leg pain when sitting, esp in the afternoon.  Better in the day when up and moving around.  He has rx for home stretches, has tried doing them in the bed but limited by pain.  He is a little better in the meantime, but clearly note resolved.    Sugar is improved with januvia added on, per patient report.  He has B paresthesias likely related to DM2.    Per patient, he had no idea that he had a previous diagnosis of hypertension. It is been on his chart for years. I told him he did have hypertension, treated.  Meds, vitals, and allergies reviewed.   ROS: Per HPI unless specifically indicated in ROS section   nad ncat rrr ctab abd soft, no ttp Back not ttp, no cva pain but R SLR positive.  L SLR neg.  Able to bear weight. Straight grossly within normal limits for the bilateral legs.

## 2016-06-01 DIAGNOSIS — M5430 Sciatica, unspecified side: Secondary | ICD-10-CM | POA: Insufficient documentation

## 2016-06-01 NOTE — Assessment & Plan Note (Addendum)
Okay for outpatient follow-up. Diagnosis discussed with patient. Refer to physical therapy. Start taking tylenol for pain, up to 2 tabs 3 times a day.  If still in pain, then use tramadol.  Sedation caution.  Update [place] as needed. He agrees. I did not use NSAIDs because of his concurrent losartan use. Avoid prednisone due to diabetes. Discussed with patient about follow-up with PCP regarding diabetes.

## 2016-06-09 DIAGNOSIS — M5431 Sciatica, right side: Secondary | ICD-10-CM | POA: Diagnosis not present

## 2016-06-09 DIAGNOSIS — M545 Low back pain: Secondary | ICD-10-CM | POA: Diagnosis not present

## 2016-06-14 DIAGNOSIS — M545 Low back pain: Secondary | ICD-10-CM | POA: Diagnosis not present

## 2016-06-14 DIAGNOSIS — M5431 Sciatica, right side: Secondary | ICD-10-CM | POA: Diagnosis not present

## 2016-06-28 DIAGNOSIS — M5431 Sciatica, right side: Secondary | ICD-10-CM | POA: Diagnosis not present

## 2016-06-28 DIAGNOSIS — M545 Low back pain: Secondary | ICD-10-CM | POA: Diagnosis not present

## 2016-09-14 ENCOUNTER — Ambulatory Visit (INDEPENDENT_AMBULATORY_CARE_PROVIDER_SITE_OTHER): Payer: Medicare Other | Admitting: Internal Medicine

## 2016-09-14 ENCOUNTER — Encounter: Payer: Self-pay | Admitting: Internal Medicine

## 2016-09-14 VITALS — BP 114/70 | HR 78 | Temp 98.1°F | Ht 67.0 in | Wt 180.0 lb

## 2016-09-14 DIAGNOSIS — I10 Essential (primary) hypertension: Secondary | ICD-10-CM | POA: Diagnosis not present

## 2016-09-14 DIAGNOSIS — IMO0002 Reserved for concepts with insufficient information to code with codable children: Secondary | ICD-10-CM

## 2016-09-14 DIAGNOSIS — E1165 Type 2 diabetes mellitus with hyperglycemia: Secondary | ICD-10-CM | POA: Diagnosis not present

## 2016-09-14 DIAGNOSIS — E1140 Type 2 diabetes mellitus with diabetic neuropathy, unspecified: Secondary | ICD-10-CM

## 2016-09-14 DIAGNOSIS — E039 Hypothyroidism, unspecified: Secondary | ICD-10-CM

## 2016-09-14 DIAGNOSIS — Z0000 Encounter for general adult medical examination without abnormal findings: Secondary | ICD-10-CM | POA: Diagnosis not present

## 2016-09-14 DIAGNOSIS — Z7189 Other specified counseling: Secondary | ICD-10-CM

## 2016-09-14 LAB — HEMOGLOBIN A1C: HEMOGLOBIN A1C: 7.8 % — AB (ref 4.6–6.5)

## 2016-09-14 NOTE — Assessment & Plan Note (Signed)
BP Readings from Last 3 Encounters:  09/14/16 114/70  05/31/16 108/60  05/04/16 100/60   Good control

## 2016-09-14 NOTE — Assessment & Plan Note (Signed)
I have personally reviewed the Medicare Annual Wellness questionnaire and have noted 1. The patient's medical and social history 2. Their use of alcohol, tobacco or illicit drugs 3. Their current medications and supplements 4. The patient's functional ability including ADL's, fall risks, home safety risks and hearing or visual             impairment. 5. Diet and physical activities 6. Evidence for depression or mood disorders  The patients weight, height, BMI and visual acuity have been recorded in the chart I have made referrals, counseling and provided education to the patient based review of the above and I have provided the pt with a written personalized care plan for preventive services.  I have provided you with a copy of your personalized plan for preventive services. Please take the time to review along with your updated medication list.  Flu vaccine later this fall Probably no more colonoscopies No PSA due to age Discussed trying to exercise

## 2016-09-14 NOTE — Progress Notes (Signed)
Subjective:    Patient ID: Sean Koch, male    DOB: 12/19/1940, 76 y.o.   MRN: [id number]  HPI Here for Medicare wellness visit and follow up of chronic health conditions Reviewed form and advanced directives Reviewed other doctors No alcohol or tobacco Not exercising--discussed Vision okay. Mild hearing loss. No falls No depression or anhedonia Independent with instrumental ADLs Mild memory issues--- not overly concerning  Doing well Some aches and pains--now in right shoulder  ROM okay though No active synovitis--so seronegative RA seems quiet (??viral mediated)  Checking sugars 2-3 times a week fastings usually ~150 No hypoglycemic reactions No problems with januvia Feet remain decreased in sensation--but only rare sharp pains  No chest pain  NO SOB No dizziness or syncope No edema  Nocturia x 1 No daytime problems  Current Outpatient Prescriptions on File Prior to Visit  Medication Sig Dispense Refill  . aspirin EC 81 MG tablet Take 81 mg by mouth daily.    . Blood Glucose Monitoring Suppl (ONE TOUCH ULTRA SYSTEM KIT) w/Device KIT Use to test blood sugar once daily DX: E11.41 1 each 0  . Calcium Carb-Cholecalciferol 600-800 MG-UNIT TABS Take 2 tablets by mouth daily.     Marland Kitchen glipiZIDE (GLUCOTROL) 10 MG tablet TAKE 1 TABLET BY MOUTH  TWICE A DAY BEFORE A MEAL 180 tablet 2  . glucose blood (ONE TOUCH ULTRA TEST) test strip Use as instructed, to test 2-3 times weekly dx: E11.49 100 each 3  . levothyroxine (SYNTHROID, LEVOTHROID) 75 MCG tablet Take 1 tablet (75 mcg total) by mouth daily. 90 tablet 2  . losartan (COZAAR) 100 MG tablet Take 1 tablet (100 mg total) by mouth daily. 90 tablet 2  . metFORMIN (GLUCOPHAGE) 500 MG tablet TAKE 2 TABLETS BY MOUTH TWO TIMES DAILY WITH A MEAL 360 tablet 2  . omeprazole (PRILOSEC) 20 MG capsule Take 1 capsule (20 mg total) by mouth daily. 90 capsule 3  . ONE TOUCH LANCETS MISC Use to test blood sugar 2-3 times weekly dx: E11.49  100 each 3  . simvastatin (ZOCOR) 80 MG tablet Take 1 tablet (80 mg total) by mouth at bedtime. 90 tablet 2  . sitaGLIPtin (JANUVIA) 100 MG tablet Take 1 tablet (100 mg total) by mouth daily. 90 tablet 3  . traMADol (ULTRAM) 50 MG tablet Take 1 tablet (50 mg total) by mouth every 8 (eight) hours as needed for moderate pain (sedation caution). 30 tablet 0   No current facility-administered medications on file prior to visit.     Allergies  Allergen Reactions  . Atorvastatin     REACTION: Joint aches    Past Medical History:  Diagnosis Date  . Carpal tunnel syndrome of right wrist   . Cataract   . ED (erectile dysfunction)   . GERD (gastroesophageal reflux disease)   . History of nephrolithiasis   . Hyperlipidemia   . Melanoma ([place])   . NIDDM, uncontrolled, with neuropathy   . Personal history of colonic polyps - 2 small adenomas 07/15/2013  . Renal disorder    kidney stones  . Seronegative rheumatoid arthritis ([place])   . Thyroid disease     Past Surgical History:  Procedure Laterality Date  . CATARACT EXTRACTION W/ INTRAOCULAR LENS IMPLANT Bilateral 11/14 & 2/15  . CHOLECYSTECTOMY    . KNEE ARTHROSCOPY    . MELANOMA EXCISION     upper right arm  . PILONIDAL CYST / SINUS EXCISION    . ROTATOR CUFF REPAIR    .  TOE SURGERY      Family History  Problem Relation Age of Onset  . Heart disease Mother   . Diabetes Mother   . Heart disease Brother   . Cancer Neg Hx   . Colon cancer Neg Hx     Social History   Social History  . Marital status: Married    Spouse name: N/A  . Number of children: 2  . Years of education: N/A   Occupational History  . retired Geneticist, molecular    Social History Main Topics  . Smoking status: Never Smoker  . Smokeless tobacco: Never Used  . Alcohol use Yes     Comment: 1 glass wine monthly per pt.  . Drug use: No  . Sexual activity: Not on file   Other Topics Concern  . Not on file   Social History Narrative   Has living will    Wife,  then son Ousmane, would be health care POA   Would accept resuscitation. No prolonged artificial ventilation.   Not sure about tube feeds   Review of Systems No recent sciatica problems Sleeps okay Appetite is okay Weight is stable Wears seat belt Teeth are fine---keeps up with dentist No recent skin problems Bowels are okay--no blood No heartburn or dysphagia    Objective:   Physical Exam  Constitutional: He is oriented to person, place, and time. He appears well-developed. No distress.  HENT:  Mouth/Throat: Oropharynx is clear and moist. No oropharyngeal exudate.  Neck: No thyromegaly present.  Cardiovascular: Normal rate, regular rhythm, normal heart sounds and intact distal pulses.  Exam reveals no gallop.   No murmur heard. Faint pedal pulses  Pulmonary/Chest: Effort normal and breath sounds normal. No respiratory distress. He has no wheezes. He has no rales.  Abdominal: Soft. There is no tenderness.  Musculoskeletal: He exhibits no edema or tenderness.  Lymphadenopathy:    He has no cervical adenopathy.  Neurological: He is alert and oriented to person, place, and time.  President--- "Daisy Floro, obama, Bush" [phone] D-[id number] Recall 2/3    Skin: No rash noted. No erythema.  No foot lesions  Psychiatric: He has a normal mood and affect. His behavior is normal.          Assessment & Plan:

## 2016-09-14 NOTE — Assessment & Plan Note (Signed)
Hopefully better Goal for him is under 9%

## 2016-09-14 NOTE — Assessment & Plan Note (Signed)
Seems to be euthyroid 

## 2016-09-14 NOTE — Assessment & Plan Note (Signed)
See social history 

## 2016-09-16 ENCOUNTER — Other Ambulatory Visit: Payer: Self-pay | Admitting: Internal Medicine

## 2016-09-27 DIAGNOSIS — Z9842 Cataract extraction status, left eye: Secondary | ICD-10-CM | POA: Diagnosis not present

## 2016-09-27 DIAGNOSIS — Z9841 Cataract extraction status, right eye: Secondary | ICD-10-CM | POA: Diagnosis not present

## 2016-09-27 DIAGNOSIS — E119 Type 2 diabetes mellitus without complications: Secondary | ICD-10-CM | POA: Diagnosis not present

## 2016-09-27 LAB — HM DIABETES EYE EXAM

## 2016-10-04 ENCOUNTER — Telehealth: Payer: Self-pay | Admitting: Internal Medicine

## 2016-10-04 NOTE — Telephone Encounter (Signed)
Opened in error

## 2016-10-06 ENCOUNTER — Encounter: Payer: Self-pay | Admitting: Internal Medicine

## 2016-10-10 ENCOUNTER — Ambulatory Visit: Payer: Self-pay | Admitting: Family Medicine

## 2016-10-31 ENCOUNTER — Ambulatory Visit (INDEPENDENT_AMBULATORY_CARE_PROVIDER_SITE_OTHER): Payer: Medicare Other | Admitting: Family Medicine

## 2016-10-31 ENCOUNTER — Ambulatory Visit (INDEPENDENT_AMBULATORY_CARE_PROVIDER_SITE_OTHER)
Admission: RE | Admit: 2016-10-31 | Discharge: 2016-10-31 | Disposition: A | Discharge status: Home / Self Care | Payer: Medicare Other | Source: Ambulatory Visit | Attending: Family Medicine | Admitting: Family Medicine

## 2016-10-31 ENCOUNTER — Encounter: Payer: Self-pay | Admitting: Family Medicine

## 2016-10-31 VITALS — BP 110/60 | HR 79 | Temp 98.9°F | Ht 67.0 in | Wt 184.2 lb

## 2016-10-31 DIAGNOSIS — M7501 Adhesive capsulitis of right shoulder: Secondary | ICD-10-CM | POA: Diagnosis not present

## 2016-10-31 DIAGNOSIS — E11618 Type 2 diabetes mellitus with other diabetic arthropathy: Secondary | ICD-10-CM

## 2016-10-31 DIAGNOSIS — M25511 Pain in right shoulder: Secondary | ICD-10-CM | POA: Diagnosis not present

## 2016-10-31 MED ORDER — METHYLPREDNISOLONE ACETATE 40 MG/ML IJ SUSP
80.0000 mg | Freq: Once | INTRAMUSCULAR | Status: AC
  Administered 2016-10-31: 80 mg via INTRA_ARTICULAR

## 2016-10-31 NOTE — Progress Notes (Signed)
Dr. Frederico Hamman T. Victorian Gunn, MD, [facility] [place], [address] Phone: [phone] Fax: [phone]  10/31/2016  Patient: Sean Koch, MRN: [id number], DOB: [birth year], 76 y.o.  Primary Physician:  Venia Carbon, MD   Chief Complaint  Patient presents with  . Arm Pain    2 month-No injury   Subjective:   Sean Koch is a 76 y.o. very pleasant male patient who presents with the following: shoulder pain  The patient noted above presents with shoulder pain that has been ongoing for 2 mo. there is no history of trauma or accident. The patient denies neck pain or radicular symptoms. No shoulder blade pain Denies dislocation, subluxation, separation of the shoulder. The patient does complain of pain with flexion, abduction, and terminal motion.  Significant restriction of motion. he describes a deep ache around the shoulder, and sometimes it will wake the patient up at night.  + h/o DM Lab Results  Component Value Date   HGBA1C 7.8 (H) 09/14/2016    R arm - positions it will hurt, some time pain in and around the elbow.  Medications Tried: tylenol Ice or Heat: minimal help Tried PT: No  Prior shoulder Injury: No Prior surgery: No Prior fracture: No  Past Medical History, Surgical History, Social History, Family History, Medications, and allergies reviewed and updated if relevant.   Patient Active Problem List   Diagnosis Date Noted  . Sciatica 06/01/2016  . Advance directive discussed with patient 03/26/2014  . Seronegative rheumatoid arthritis ([place])   . BPPV (benign paroxysmal positional vertigo) 09/26/2013  . Arthritis of hand 09/10/2013  . Internal hemorrhoids without mention of complication 89/37/3428  . Personal history of colonic polyps - 2 small adenomas 07/15/2013  . Diabetes, polyneuropathy ([place]) 03/13/2013  . Unspecified constipation 03/01/2012  . Osteoarthritis, knee 05/23/2011  .  Routine general medical examination at a health care facility 03/14/2011  . Essential hypertension, benign 03/02/2010  . NEPHROLITHIASIS, HX OF 02/28/2007  . Type 2 diabetes mellitus with neurological manifestations, uncontrolled ([place]) 08/26/2006  . History of melanoma 08/22/2006  . Hypothyroidism 08/22/2006  . Hyperlipemia 08/22/2006  . ERECTILE DYSFUNCTION 08/22/2006  . Reflux esophagitis 08/22/2006    Past Medical History:  Diagnosis Date  . Carpal tunnel syndrome of right wrist   . Cataract   . ED (erectile dysfunction)   . GERD (gastroesophageal reflux disease)   . History of nephrolithiasis   . Hyperlipidemia   . Melanoma ([place])   . NIDDM, uncontrolled, with neuropathy   . Personal history of colonic polyps - 2 small adenomas 07/15/2013  . Renal disorder    kidney stones  . Seronegative rheumatoid arthritis ([place])   . Thyroid disease     Past Surgical History:  Procedure Laterality Date  . CATARACT EXTRACTION W/ INTRAOCULAR LENS IMPLANT Bilateral 11/14 & 2/15  . CHOLECYSTECTOMY    . KNEE ARTHROSCOPY    . MELANOMA EXCISION     upper right arm  . PILONIDAL CYST / SINUS EXCISION    . ROTATOR CUFF REPAIR    . TOE SURGERY      Social History   Social History  . Marital status: Married    Spouse name: N/A  . Number of children: 2  . Years of education: N/A   Occupational History  . retired Geneticist, molecular    Social History Main Topics  . Smoking status: Never Smoker  . Smokeless tobacco: Never Used  . Alcohol use  Yes     Comment: 1 glass wine monthly per pt.  . Drug use: No  . Sexual activity: Not on file   Other Topics Concern  . Not on file   Social History Narrative   Has living will    Wife, then son Eliyas, would be health care POA   Would accept resuscitation. No prolonged artificial ventilation.   Not sure about tube feeds    Family History  Problem Relation Age of Onset  . Heart disease Mother   . Diabetes Mother   . Heart disease Brother   .  Cancer Neg Hx   . Colon cancer Neg Hx     Allergies  Allergen Reactions  . Atorvastatin     REACTION: Joint aches    Medication list reviewed and updated in full in [place].  GEN: No fevers, chills. Nontoxic. Primarily MSK c/o today. MSK: Detailed in the HPI GI: tolerating PO intake without difficulty Neuro: No numbness, parasthesias, or tingling associated. Otherwise the pertinent positives of the ROS are noted above.    Objective:   Blood pressure 110/60, pulse 79, temperature 98.9 F (37.2 C), temperature source Oral, height _0  (1.702 m), weight 184 lb 4 oz (83.6 kg).   GEN: WDWN, NAD, Non-toxic, Alert & Oriented x 3 HEENT: Atraumatic, Normocephalic.  Ears and Nose: No external deformity. EXTR: No clubbing/cyanosis/edema NEURO: Normal gait.  PSYCH: Normally interactive. Conversant. Not depressed or anxious appearing.  Calm demeanor.   Shoulder: R and L Inspection: No muscle wasting or winging Ecchymosis/edema: neg  AC joint, scapula, clavicle: NT Cervical spine: NT, full ROM Spurling's: neg ABNORMAL SIDE TESTED: R UNLESS OTHERWISE NOTED, THE CONTRALATERAL SIDE HAS FULL RANGE OF MOTION. Abduction: 5/5, LIMITED TO 80 DEGREES Flexion: 5/5, LIMITED TO 115 DEGNO ROM  IR, lift-off: 5/5. TESTED AT 90 DEGREES OF ABDUCTION, LIMITED TO 0 DEGREES ER at neutral:  5/5, TESTED AT 90 DEGREES OF ABDUCTION, LIMITED TO 30 DEGREES AC crossover and compression: PAIN Drop Test: neg Empty Can: neg Supraspinatus insertion: NT Bicipital groove: NT ALL OTHER SPECIAL TESTING EQUIVOCAL GIVEN LOSS OF MOTION C5-T1 intact Sensation intact Grip 5/5   Dg Shoulder Right  Result Date: 10/31/2016 CLINICAL DATA:  Right shoulder pain for 2 months.  No known injury. EXAM: RIGHT SHOULDER - 2+ VIEW COMPARISON:  None. FINDINGS: No acute bony or joint abnormality is identified. Moderate to moderately severe acromioclavicular and moderate glenohumeral osteoarthritis is seen. No focal bony  lesion. Imaged left lung and ribs appear normal. IMPRESSION: No acute abnormality. Moderate to moderately severe acromioclavicular and moderate glenohumeral osteoarthritis. Electronically Signed   By: Inge Rise M.D.   On: 10/31/2016 16:14    The radiological images were independently reviewed by myself in the office and results were reviewed with the patient. My independent interpretation of images:  Would characterize glenohumeral osteoarthritic change more mild in character, and agree with moderate to severe acromioclavicular osteoarthritic changes.  No evidence of lytic lesion and no dislocation or fracture. Electronically Signed  By: Owens Loffler, MD On: 11/01/2016 7:07 AM   Assessment and Plan:   Adhesive capsulitis of right shoulder associated with type 2 diabetes mellitus ([place]) - Plan: Ambulatory referral to Physical Therapy, methylPREDNISolone acetate (DEPO-MEDROL) injection 80 mg  Right shoulder pain, unspecified chronicity - Plan: DG Shoulder Right, methylPREDNISolone acetate (DEPO-MEDROL) injection 80 mg  >25 minutes spent in face to face time with patient, >50% spent in counselling or coordination of care  Patient was given a  systematic ROM protocol from [facility] to be done daily. Emphasized importance of adherence, help of PT, daily HEP.  The average length of total symptoms is 12-18 months going through 3 different phases in the freezing and thawing process. Reviewed all with patient.   Tylenol or NSAID of choice prn for pain relief Intraarticular shoulder injections discussed with patient, which have good evidence for accelerating the thawing phase.  Patient will be sent for formal PT for aggressive frozen shoulder ROM. Will need RTC str and scapular stabilization to fix underlying mechanics.  Intrarticular Shoulder Injection, R Verbal consent was obtained from the patient. Risks including infection explained and contrasted with benefits and alternatives. Patient prepped  with Chloraprep and Ethyl Chloride used for anesthesia. An intraarticular shoulder injection was performed using the posterior approach. The patient tolerated the procedure well and had decreased pain post injection. No complications. Injection: 8 cc of Lidocaine 1% and 2 mL Depo-Medrol 40 mg. Needle: 22 gauge   Follow-up: Return in about 2 months (around 12/31/2016).  Orders Placed This Encounter  Procedures  . DG Shoulder Right  . Ambulatory referral to Physical Therapy    Signed,  Frederico Hamman T. Eliyah Bazzi, MD   Patient's Medications  New Prescriptions   No medications on file  Previous Medications   ASPIRIN EC 81 MG TABLET    Take 81 mg by mouth daily.   BLOOD GLUCOSE MONITORING SUPPL (ONE TOUCH ULTRA SYSTEM KIT) W/DEVICE KIT    Use to test blood sugar once daily DX: E11.41   CALCIUM CARB-CHOLECALCIFEROL 600-800 MG-UNIT TABS    Take 2 tablets by mouth daily.    GLIPIZIDE (GLUCOTROL) 10 MG TABLET    TAKE 1 TABLET BY MOUTH  TWICE A DAY BEFORE A MEAL   GLUCOSE BLOOD (ONE TOUCH ULTRA TEST) TEST STRIP    Use as instructed, to test 2-3 times weekly dx: E11.49   LEVOTHYROXINE (SYNTHROID, LEVOTHROID) 75 MCG TABLET    TAKE 1 TABLET BY MOUTH  DAILY   LOSARTAN (COZAAR) 100 MG TABLET    TAKE 1 TABLET BY MOUTH  DAILY   METFORMIN (GLUCOPHAGE) 500 MG TABLET    TAKE 2 TABLETS BY MOUTH TWO TIMES DAILY WITH A MEAL   OMEPRAZOLE (PRILOSEC) 20 MG CAPSULE    Take 1 capsule (20 mg total) by mouth daily.   ONE TOUCH LANCETS MISC    Use to test blood sugar 2-3 times weekly dx: E11.49   SIMVASTATIN (ZOCOR) 80 MG TABLET    TAKE 1 TABLET BY MOUTH AT  BEDTIME   SITAGLIPTIN (JANUVIA) 100 MG TABLET    Take 1 tablet (100 mg total) by mouth daily.   TRAMADOL (ULTRAM) 50 MG TABLET    Take 1 tablet (50 mg total) by mouth every 8 (eight) hours as needed for moderate pain (sedation caution).  Modified Medications   No medications on file  Discontinued Medications   No medications on file

## 2016-12-19 ENCOUNTER — Ambulatory Visit: Payer: Self-pay | Admitting: Family Medicine

## 2017-01-04 ENCOUNTER — Ambulatory Visit: Payer: Self-pay | Admitting: Family Medicine

## 2017-01-11 DIAGNOSIS — L570 Actinic keratosis: Secondary | ICD-10-CM | POA: Diagnosis not present

## 2017-01-11 DIAGNOSIS — D2261 Melanocytic nevi of right upper limb, including shoulder: Secondary | ICD-10-CM | POA: Diagnosis not present

## 2017-01-11 DIAGNOSIS — D2262 Melanocytic nevi of left upper limb, including shoulder: Secondary | ICD-10-CM | POA: Diagnosis not present

## 2017-01-11 DIAGNOSIS — Z85828 Personal history of other malignant neoplasm of skin: Secondary | ICD-10-CM | POA: Diagnosis not present

## 2017-01-11 DIAGNOSIS — Z85820 Personal history of malignant melanoma of skin: Secondary | ICD-10-CM | POA: Diagnosis not present

## 2017-01-11 DIAGNOSIS — L821 Other seborrheic keratosis: Secondary | ICD-10-CM | POA: Diagnosis not present

## 2017-01-11 DIAGNOSIS — L820 Inflamed seborrheic keratosis: Secondary | ICD-10-CM | POA: Diagnosis not present

## 2017-01-11 DIAGNOSIS — D225 Melanocytic nevi of trunk: Secondary | ICD-10-CM | POA: Diagnosis not present

## 2017-01-11 DIAGNOSIS — D1801 Hemangioma of skin and subcutaneous tissue: Secondary | ICD-10-CM | POA: Diagnosis not present

## 2017-01-24 DIAGNOSIS — R69 Illness, unspecified: Secondary | ICD-10-CM | POA: Diagnosis not present

## 2017-02-02 DIAGNOSIS — R69 Illness, unspecified: Secondary | ICD-10-CM | POA: Diagnosis not present

## 2017-02-09 ENCOUNTER — Other Ambulatory Visit: Payer: Self-pay | Admitting: Internal Medicine

## 2017-03-22 ENCOUNTER — Encounter: Payer: Self-pay | Admitting: Internal Medicine

## 2017-03-22 ENCOUNTER — Ambulatory Visit (INDEPENDENT_AMBULATORY_CARE_PROVIDER_SITE_OTHER): Payer: Medicare HMO | Admitting: Internal Medicine

## 2017-03-22 VITALS — BP 108/66 | HR 74 | Temp 98.5°F | Wt 181.0 lb

## 2017-03-22 DIAGNOSIS — E1149 Type 2 diabetes mellitus with other diabetic neurological complication: Secondary | ICD-10-CM | POA: Diagnosis not present

## 2017-03-22 DIAGNOSIS — E1165 Type 2 diabetes mellitus with hyperglycemia: Secondary | ICD-10-CM

## 2017-03-22 DIAGNOSIS — E11618 Type 2 diabetes mellitus with other diabetic arthropathy: Secondary | ICD-10-CM | POA: Diagnosis not present

## 2017-03-22 DIAGNOSIS — M7500 Adhesive capsulitis of unspecified shoulder: Secondary | ICD-10-CM

## 2017-03-22 DIAGNOSIS — IMO0002 Reserved for concepts with insufficient information to code with codable children: Secondary | ICD-10-CM

## 2017-03-22 LAB — COMPREHENSIVE METABOLIC PANEL
ALT: 13 U/L (ref 0–53)
AST: 15 U/L (ref 0–37)
Albumin: 4.1 g/dL (ref 3.5–5.2)
Alkaline Phosphatase: 66 U/L (ref 39–117)
BUN: 16 mg/dL (ref 6–23)
CHLORIDE: 103 meq/L (ref 96–112)
CO2: 27 meq/L (ref 19–32)
Calcium: 9.7 mg/dL (ref 8.4–10.5)
Creatinine, Ser: 1.24 mg/dL (ref 0.40–1.50)
GFR: 59.78 mL/min — AB (ref >60.00)
GLUCOSE: 298 mg/dL — AB (ref 70–99)
POTASSIUM: 4.2 meq/L (ref 3.5–5.1)
Sodium: 138 mEq/L (ref 135–145)
Total Bilirubin: 0.6 mg/dL (ref 0.2–1.2)
Total Protein: 6.9 g/dL (ref 6.0–8.3)

## 2017-03-22 LAB — CBC
HCT: 45.2 % (ref 39.0–52.0)
Hemoglobin: 15.3 g/dL (ref 13.0–17.0)
MCHC: 33.8 g/dL (ref 30.0–36.0)
MCV: 92.4 fl (ref 78.0–100.0)
Platelets: 210 10*3/uL (ref 150.0–400.0)
RBC: 4.89 Mil/uL (ref 4.22–5.81)
RDW: 13.6 % (ref 11.5–15.5)
WBC: 8.8 10*3/uL (ref 4.0–10.5)

## 2017-03-22 LAB — HEMOGLOBIN A1C: HEMOGLOBIN A1C: 8 % — AB (ref 4.6–6.5)

## 2017-03-22 LAB — LIPID PANEL
CHOL/HDL RATIO: 4
Cholesterol: 130 mg/dL (ref 0–200)
HDL: 37.2 mg/dL — AB (ref >39.00)
LDL CALC: 63 mg/dL (ref 0–99)
NONHDL: 93.01
Triglycerides: 151 mg/dL — ABNORMAL HIGH (ref 0.0–149.0)
VLDL: 30.2 mg/dL (ref 0.0–40.0)

## 2017-03-22 LAB — HM DIABETES FOOT EXAM

## 2017-03-22 MED ORDER — SIMVASTATIN 80 MG PO TABS: 80.0000 mg | ORAL_TABLET | Freq: Every day | ORAL | 3 refills | Status: DC

## 2017-03-22 MED ORDER — LEVOTHYROXINE SODIUM 75 MCG PO TABS: 75.0000 ug | ORAL_TABLET | Freq: Every day | ORAL | 3 refills | Status: DC

## 2017-03-22 MED ORDER — GLIPIZIDE 10 MG PO TABS: ORAL_TABLET | ORAL | 3 refills | Status: DC

## 2017-03-22 MED ORDER — METFORMIN HCL 500 MG PO TABS: ORAL_TABLET | ORAL | 3 refills | Status: DC

## 2017-03-22 MED ORDER — OMEPRAZOLE 20 MG PO CPDR: 20.0000 mg | DELAYED_RELEASE_CAPSULE | Freq: Every day | ORAL | 3 refills | Status: DC

## 2017-03-22 MED ORDER — SITAGLIPTIN PHOSPHATE 100 MG PO TABS: 100.0000 mg | ORAL_TABLET | Freq: Every day | ORAL | 3 refills | Status: DC

## 2017-03-22 MED ORDER — LOSARTAN POTASSIUM 100 MG PO TABS: 100.0000 mg | ORAL_TABLET | Freq: Every day | ORAL | 3 refills | Status: DC

## 2017-03-22 NOTE — Patient Instructions (Signed)
I would recommend Dr Marchia Bond for your shoulders if you want to see an orthopedist.

## 2017-03-22 NOTE — Progress Notes (Signed)
Subjective:    Patient ID: Sean Koch, male    DOB: [birth year], 77 y.o.   MRN: [id number]  HPI Here for follow up of diabetes  Having problems with shoulders Both seem to be "frozen" to some degree Trouble even getting in a sport coat Did see Dr Tiana Loft up missed Cortisone shot didn't help Got exercises--stopped because they hurt  Feels he needs to start exercising Interested in getting massage--wonders about this  Not really checking sugars Still watching his eating No low sugar reactions Occasional tingling in feet--no sig pain  Current Outpatient Medications on File Prior to Visit  Medication Sig Dispense Refill  . aspirin EC 81 MG tablet Take 81 mg by mouth daily.    . Blood Glucose Monitoring Suppl (ONE TOUCH ULTRA SYSTEM KIT) w/Device KIT Use to test blood sugar once daily DX: E11.41 1 each 0  . Calcium Carb-Cholecalciferol 600-800 MG-UNIT TABS Take 2 tablets by mouth daily.     Marland Kitchen glucose blood (ONE TOUCH ULTRA TEST) test strip Use as instructed, to test 2-3 times weekly dx: E11.49 100 each 3  . ONE TOUCH LANCETS MISC Use to test blood sugar 2-3 times weekly dx: E11.49 100 each 3   No current facility-administered medications on file prior to visit.     Allergies  Allergen Reactions  . Atorvastatin     REACTION: Joint aches    Past Medical History:  Diagnosis Date  . Carpal tunnel syndrome of right wrist   . Cataract   . ED (erectile dysfunction)   . GERD (gastroesophageal reflux disease)   . History of nephrolithiasis   . Hyperlipidemia   . Melanoma ([place])   . NIDDM, uncontrolled, with neuropathy   . Personal history of colonic polyps - 2 small adenomas 07/15/2013  . Renal disorder    kidney stones  . Seronegative rheumatoid arthritis ([place])   . Thyroid disease     Past Surgical History:  Procedure Laterality Date  . CATARACT EXTRACTION W/ INTRAOCULAR LENS IMPLANT Bilateral 11/14 & 2/15  . CHOLECYSTECTOMY    . KNEE ARTHROSCOPY    .  MELANOMA EXCISION     upper right arm  . PILONIDAL CYST / SINUS EXCISION    . ROTATOR CUFF REPAIR    . TOE SURGERY      Family History  Problem Relation Age of Onset  . Heart disease Mother   . Diabetes Mother   . Heart disease Brother   . Cancer Neg Hx   . Colon cancer Neg Hx     Social History   Socioeconomic History  . Marital status: Married    Spouse name: Not on file  . Number of children: 2  . Years of education: Not on file  . Highest education level: Not on file  Social Needs  . Financial resource strain: Not on file  . Food insecurity - worry: Not on file  . Food insecurity - inability: Not on file  . Transportation needs - medical: Not on file  . Transportation needs - non-medical: Not on file  Occupational History  . Occupation: retired Geneticist, molecular  Tobacco Use  . Smoking status: Never Smoker  . Smokeless tobacco: Never Used  Substance and Sexual Activity  . Alcohol use: Yes    Comment: 1 glass wine monthly per pt.  . Drug use: No  . Sexual activity: Not on file  Other Topics Concern  . Not on file  Social History Narrative   Has living will  Wife, then son Bowdy, would be health care POA   Would accept resuscitation. No prolonged artificial ventilation.   Not sure about tube feeds   Review of Systems  Sleeps okay Appetite down from before--but still okay     Objective:   Physical Exam  Constitutional: No distress.  Neck: No thyromegaly present.  Cardiovascular: Normal rate, regular rhythm and normal heart sounds. Exam reveals no gallop.  No murmur heard. Faint pedal pulses  Pulmonary/Chest: Effort normal and breath sounds normal. No respiratory distress. He has no wheezes. He has no rales.  Musculoskeletal: He exhibits no edema or tenderness.  Restricted motion in both shoulders in all spheres  Lymphadenopathy:    He has no cervical adenopathy.  Neurological:  Fairly normal sensation in feet  Skin: No rash noted. No erythema.  No foot  lesions  Psychiatric: He has a normal mood and affect. His behavior is normal.          Assessment & Plan:

## 2017-03-22 NOTE — Assessment & Plan Note (Signed)
Unclear if diabetes related but did come on with poor control Functional deficit doesn't warrant surgery but will have him see ortho if massage doesn't help

## 2017-03-22 NOTE — Assessment & Plan Note (Signed)
Hopefully still acceptable control Mild neuropathy only

## 2017-04-06 ENCOUNTER — Telehealth: Payer: Self-pay | Admitting: Internal Medicine

## 2017-04-06 MED ORDER — SITAGLIPTIN PHOSPHATE 100 MG PO TABS: 100.0000 mg | ORAL_TABLET | Freq: Every day | ORAL | 0 refills | Status: DC

## 2017-04-06 NOTE — Telephone Encounter (Signed)
Copied from [place] [id number]. Topic: Quick Communication - Rx Refill/Question >> Apr 06, 2017 10:14 AM Sean Koch wrote: Medication: sitaGLIPtin (JANUVIA) 100 MG tablet  patient would like 6 tablets only Has the patient contacted their pharmacy? Yes.  Will call when we hang up (Agent: If no, request that the patient contact the pharmacy for the refill.) Preferred Pharmacy (with phone number or street name):     [place], [place] - [phone] (Phone) [phone] (Fax)     Agent: Please be advised that RX refills may take up to 3 business days. We ask that you follow-up with your pharmacy.

## 2017-04-06 NOTE — Telephone Encounter (Signed)
I spoke with pt and pt has spoken with [facility] mail order pharmacy and was advised to get small qty Pt request # 6 tabs of Januvia sent to [facility]. # 6 januvia sent to [facility] as requested.

## 2017-05-25 DIAGNOSIS — Z85828 Personal history of other malignant neoplasm of skin: Secondary | ICD-10-CM | POA: Diagnosis not present

## 2017-05-25 DIAGNOSIS — S30861A Insect bite (nonvenomous) of abdominal wall, initial encounter: Secondary | ICD-10-CM | POA: Diagnosis not present

## 2017-06-04 IMAGING — DX DG KNEE COMPLETE 4+V*R*
4 series · 4 of 4 positions shown · non-contrast
Comparison: None.

CLINICAL DATA: Right hip pain, knee pain

EXAM:
RIGHT KNEE - COMPLETE 4+ VIEW

[knee ap]
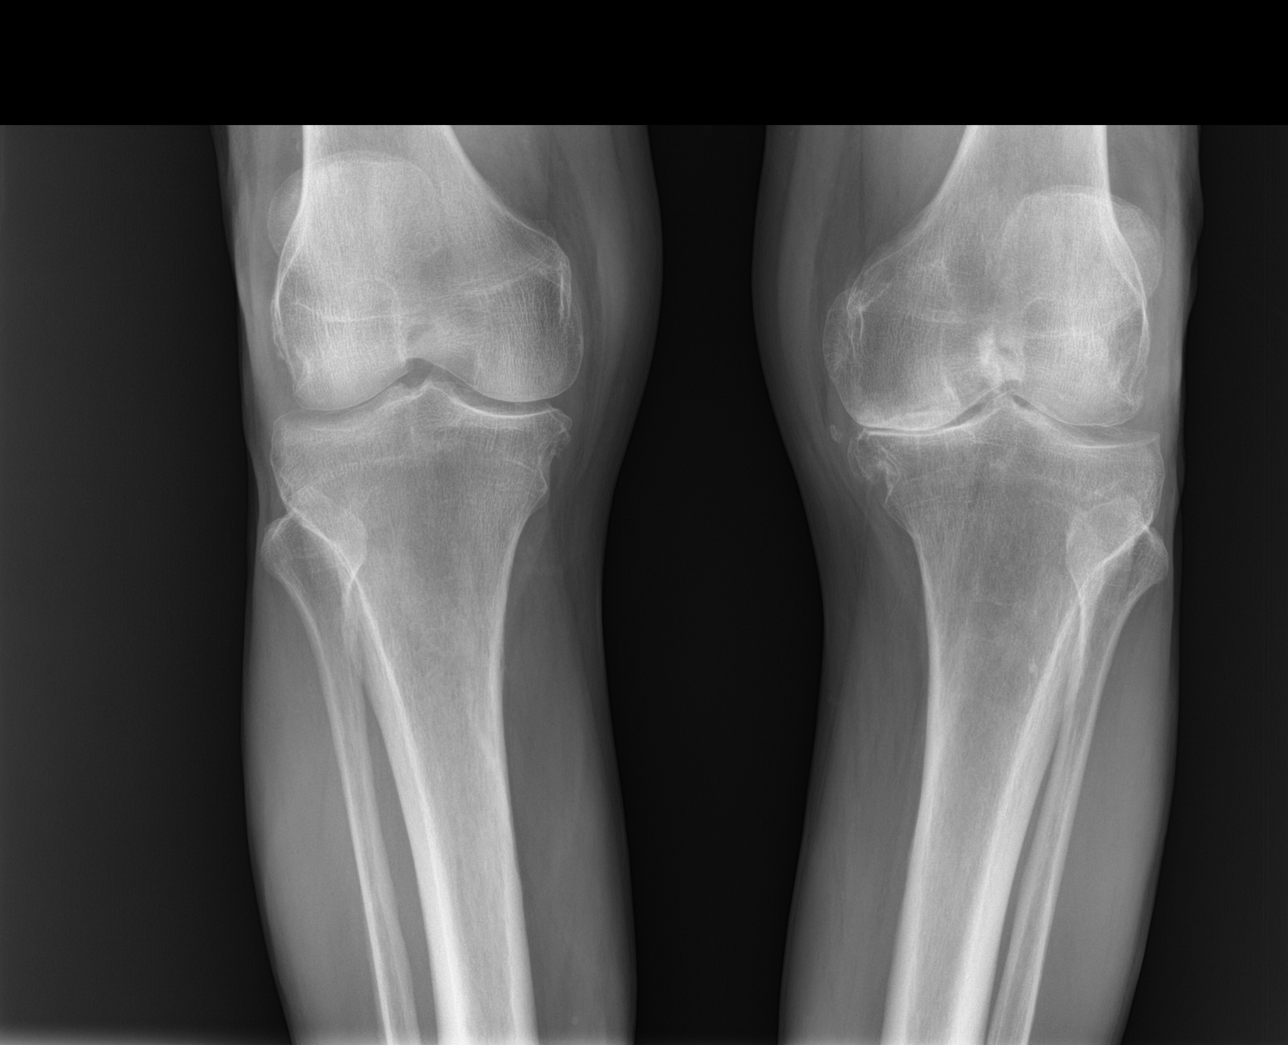

[knee lat]
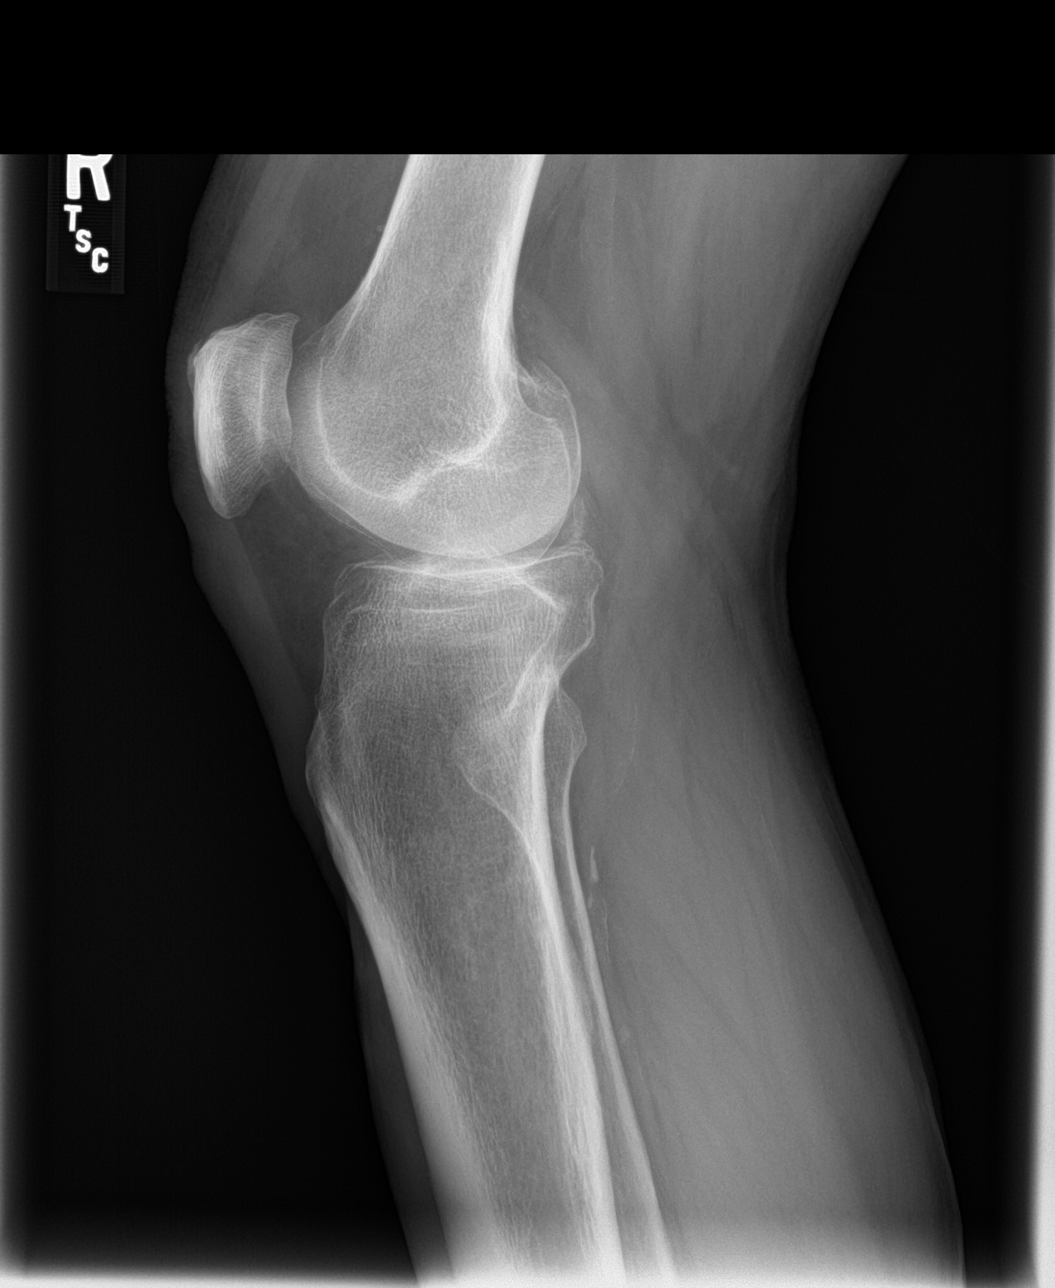

[patella skyline]
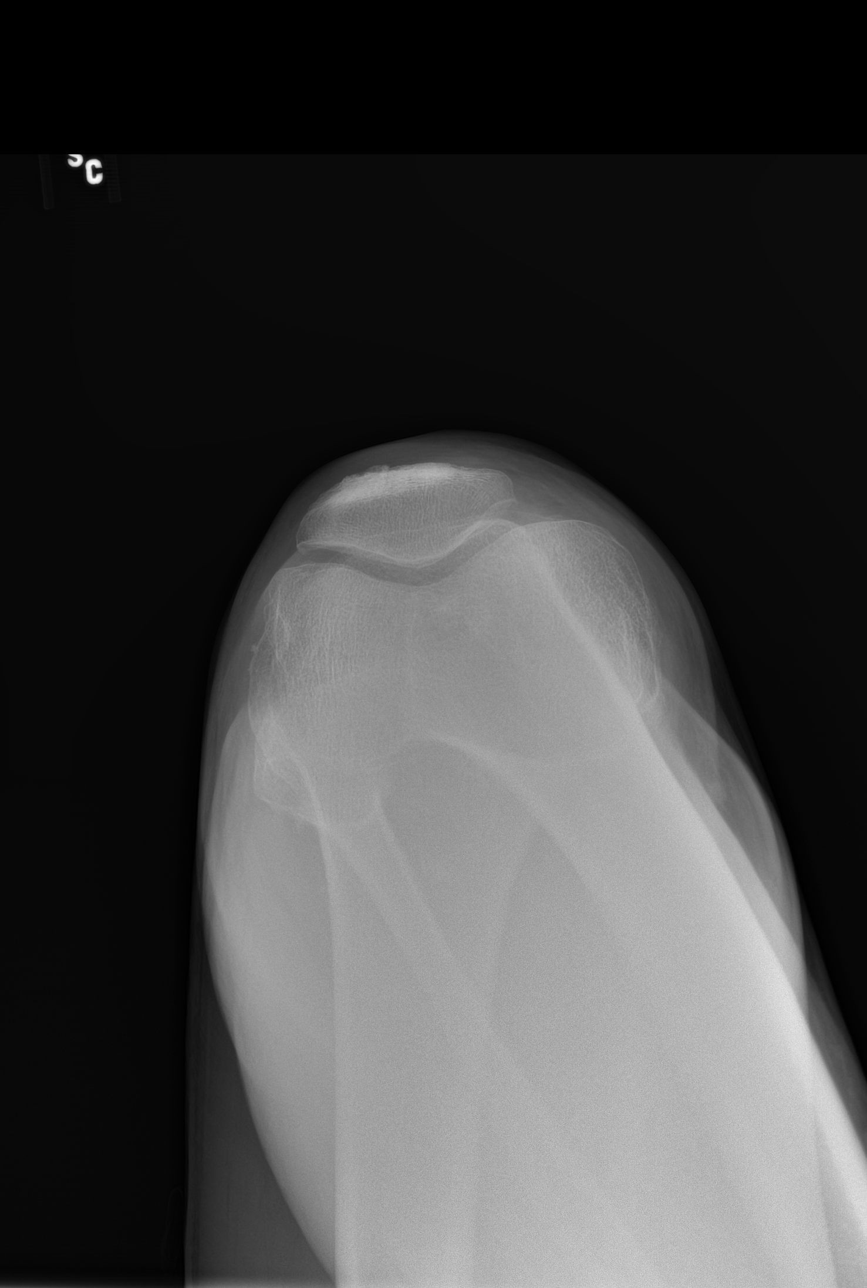

[knee obl]
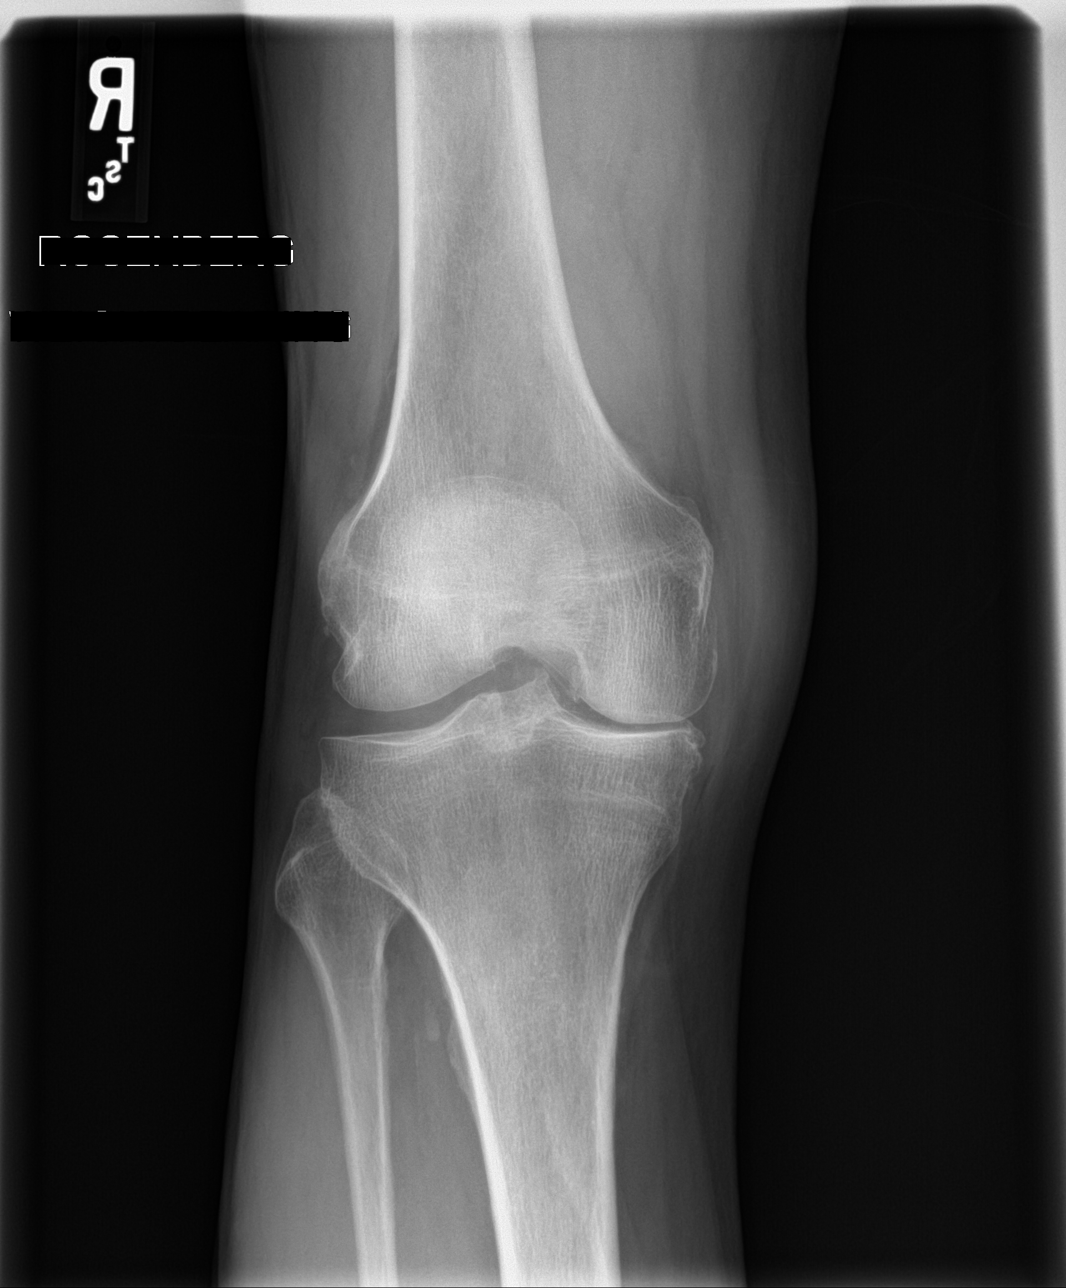

[4 of 4 positions shown; findings below may reference images not displayed]

FINDINGS: No acute fracture or dislocation.

Generalized osteopenia.

Moderate right medial femorotibial compartment joint space
narrowing. Severe left medial femorotibial compartment joint space
narrowing.

No significant right knee joint effusion.
IMPRESSION: 1.  No acute osseous injury of the right knee.
2. Moderate right medial femorotibial compartment osteoarthritis.
3. Severe left medial femorotibial compartment osteoarthritis.

## 2017-07-26 DIAGNOSIS — R69 Illness, unspecified: Secondary | ICD-10-CM | POA: Diagnosis not present

## 2017-08-22 ENCOUNTER — Ambulatory Visit (INDEPENDENT_AMBULATORY_CARE_PROVIDER_SITE_OTHER): Payer: Medicare HMO | Admitting: Family Medicine

## 2017-08-22 ENCOUNTER — Encounter: Payer: Self-pay | Admitting: Family Medicine

## 2017-08-22 VITALS — BP 114/62 | HR 86 | Temp 98.0°F | Ht 67.0 in | Wt 180.0 lb

## 2017-08-22 DIAGNOSIS — J029 Acute pharyngitis, unspecified: Secondary | ICD-10-CM

## 2017-08-22 DIAGNOSIS — J028 Acute pharyngitis due to other specified organisms: Secondary | ICD-10-CM | POA: Diagnosis not present

## 2017-08-22 DIAGNOSIS — B9789 Other viral agents as the cause of diseases classified elsewhere: Secondary | ICD-10-CM

## 2017-08-22 NOTE — Progress Notes (Signed)
BP 114/62 (BP Location: Left Arm, Patient Position: Sitting, Cuff Size: Normal)   Pulse 86   Temp 98 F (36.7 C) (Oral)   Ht 5' 7"  (1.702 m)   Wt 180 lb (81.6 kg)   SpO2 96%   BMI 28.19 kg/m    CC: throat pain Subjective:    Patient ID: Sean Koch, male    DOB: Dec 01, 1940, 77 y.o.   MRN: [id number]  HPI: Sean Koch is a 77 y.o. male presenting on 08/22/2017 for Sore Throat (C/o sore throat and swollen glands. Started 2-3 days ago. Feels like something was cut the top of his throat. Having difficulty swallowing. )   3-4d h/o ST described as sharp pain with swallowing, like something cutting mouth, denies swallowing anything sharp that could have cut throat. This was associated with swollen neck glands. This morning felt some pressure in right ear.   No fever/chills, ear or tooth pain, head congestion, cough, PNDrainage, abd pain, nausea, rashes. No sick contacts at home.  No strep throat exposure that he knows of.  Tried nothing for these symptoms.  He was around grandchildren up [place] recently, but they weren't sick  RA - not on immune modulating medication DM - on metformin, glipizide, januvia. Lab Results  Component Value Date   HGBA1C 8.0 (H) 03/22/2017     Relevant past medical, surgical, family and social history reviewed and updated as indicated. Interim medical history since our last visit reviewed. Allergies and medications reviewed and updated. Outpatient Medications Prior to Visit  Medication Sig Dispense Refill  . aspirin EC 81 MG tablet Take 81 mg by mouth daily.    . Blood Glucose Monitoring Suppl (ONE TOUCH ULTRA SYSTEM KIT) w/Device KIT Use to test blood sugar once daily DX: E11.41 1 each 0  . Calcium Carb-Cholecalciferol 600-800 MG-UNIT TABS Take 2 tablets by mouth daily.     Marland Kitchen glipiZIDE (GLUCOTROL) 10 MG tablet TAKE 1 TABLET BY MOUTH  TWICE A DAY BEFORE MEALS 180 tablet 3  . glucose blood (ONE TOUCH ULTRA TEST) test strip Use as instructed, to  test 2-3 times weekly dx: E11.49 100 each 3  . levothyroxine (SYNTHROID, LEVOTHROID) 75 MCG tablet Take 1 tablet (75 mcg total) by mouth daily. 90 tablet 3  . losartan (COZAAR) 100 MG tablet Take 1 tablet (100 mg total) by mouth daily. 90 tablet 3  . metFORMIN (GLUCOPHAGE) 500 MG tablet TAKE 2 TABLETS BY MOUTH TWO TIMES DAILY WITH MEALS 360 tablet 3  . omeprazole (PRILOSEC) 20 MG capsule Take 1 capsule (20 mg total) by mouth daily. 90 capsule 3  . ONE TOUCH LANCETS MISC Use to test blood sugar 2-3 times weekly dx: E11.49 100 each 3  . simvastatin (ZOCOR) 80 MG tablet Take 1 tablet (80 mg total) by mouth at bedtime. 90 tablet 3  . sitaGLIPtin (JANUVIA) 100 MG tablet Take 1 tablet (100 mg total) by mouth daily. 6 tablet 0   No facility-administered medications prior to visit.      Per HPI unless specifically indicated in ROS section below Review of Systems     Objective:    BP 114/62 (BP Location: Left Arm, Patient Position: Sitting, Cuff Size: Normal)   Pulse 86   Temp 98 F (36.7 C) (Oral)   Ht 5' 7"  (1.702 m)   Wt 180 lb (81.6 kg)   SpO2 96%   BMI 28.19 kg/m   Wt Readings from Last 3 Encounters:  08/22/17 180 lb (81.6 kg)  03/22/17  181 lb (82.1 kg)  10/31/16 184 lb 4 oz (83.6 kg)    Physical Exam  Constitutional: He appears well-developed and well-nourished. No distress.  HENT:  Head: Normocephalic and atraumatic.  Right Ear: Hearing, tympanic membrane, external ear and ear canal normal.  Left Ear: Hearing, tympanic membrane, external ear and ear canal normal.  Nose: Nose normal. No mucosal edema or rhinorrhea. Right sinus exhibits no maxillary sinus tenderness and no frontal sinus tenderness. Left sinus exhibits no maxillary sinus tenderness and no frontal sinus tenderness.  Mouth/Throat: Uvula is midline and mucous membranes are normal. Posterior oropharyngeal edema and posterior oropharyngeal erythema present. No oropharyngeal exudate or tonsillar abscesses.  Few papules  bilateral posterior oropharynx  Eyes: Pupils are equal, round, and reactive to light. Conjunctivae and EOM are normal. No scleral icterus.  Neck: Normal range of motion. Neck supple.  Cardiovascular: Normal rate, regular rhythm, normal heart sounds and intact distal pulses.  No murmur heard. Pulmonary/Chest: Effort normal and breath sounds normal. No respiratory distress. He has no wheezes. He has no rales.  Lymphadenopathy:    He has cervical adenopathy (tender R AC LN without obvious enlargement).  Skin: Skin is warm and dry. No rash noted.  Nursing note and vitals reviewed.     Assessment & Plan:   Problem List Items Addressed This Visit    Acute viral pharyngitis - Primary    Anticipate viral given short duration, benign exam, and ongoing recovery. Supportive care reviewed as per patient instructions. Update if not improving with treatment or if fever or other worsening symptoms.  Pt agrees with plan.           No orders of the defined types were placed in this encounter.  No orders of the defined types were placed in this encounter.   Follow up plan: Return if symptoms worsen or fail to improve.  Ria Bush, MD

## 2017-08-22 NOTE — Assessment & Plan Note (Addendum)
Anticipate viral given short duration, benign exam, and ongoing recovery. Supportive care reviewed as per patient instructions. Update if not improving with treatment or if fever or other worsening symptoms.  Pt agrees with plan.

## 2017-08-22 NOTE — Patient Instructions (Signed)
I think you have viral pharyngitis. Push fluids and plenty of rest. May use ibuprofen for throat inflammation (200-400mg  with meals for next few days as needed). Salt water gargles. Suck on cold things like popsicles or warm things like herbal teas (whichever soothes the throat better). Watch for fever >101.5, worsening throat pain, or trouble opening/closing mouth, or hoarse voice. Good to see you today, call clinic with questions.

## 2017-08-25 IMAGING — US US EXTREM LOW VENOUS*R*
1 series · 13 of 24 positions shown · non-contrast
Comparison: None.

CLINICAL DATA: Right lower extremity pain

EXAM:
RIGHT LOWER EXTREMITY VENOUS DUPLEX ULTRASOUND
TECHNIQUE: Gray-scale sonography with graded compression, as well as color
Doppler and duplex ultrasound were performed to evaluate the right
lower extremity deep venous system from the level of the common
femoral vein and including the common femoral, femoral, profunda
femoral, popliteal and calf veins including the posterior tibial,
peroneal and gastrocnemius veins when visible. The superficial great
saphenous vein was also interrogated. Spectral Doppler was utilized
to evaluate flow at rest and with distal augmentation maneuvers in
the common femoral, femoral and popliteal veins.

[Series 1: us extrem low venous*right* · 0.07mm/px · 13 of 32 slices shown]
[im 1/32]
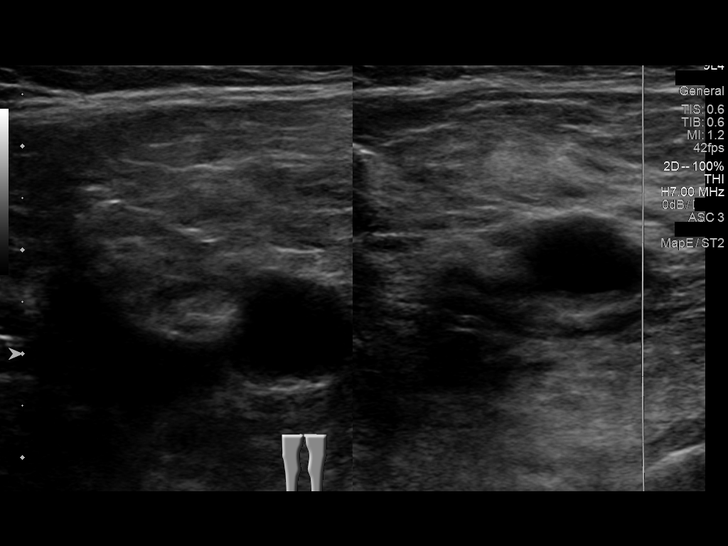
[im 3/32]
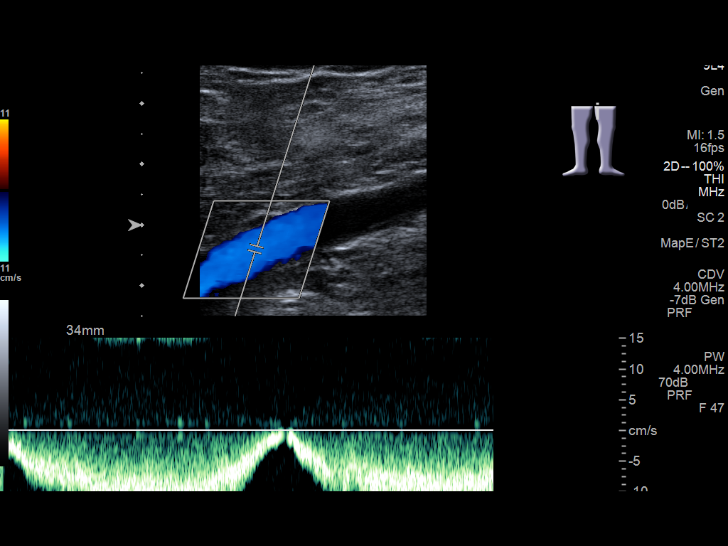
[im 6/32]
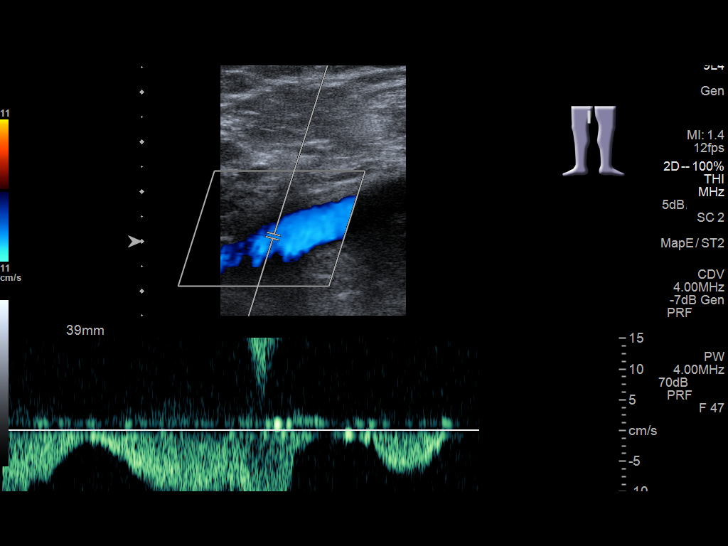
[im 9/32]
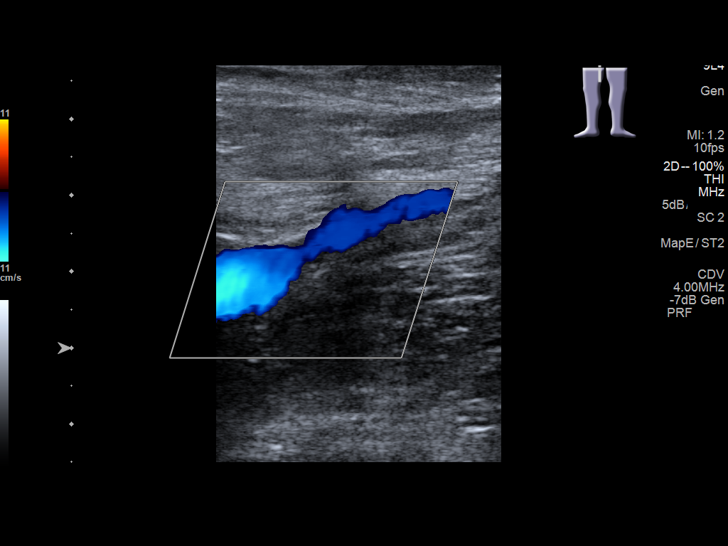
[im 11/32]
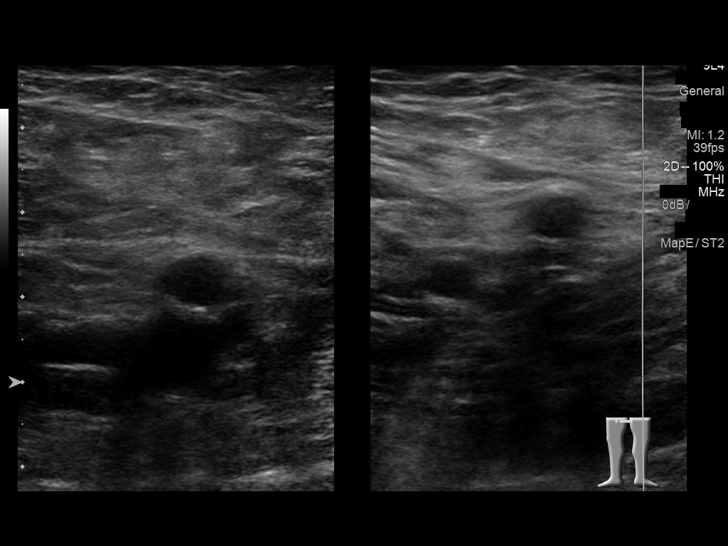
[im 14/32]
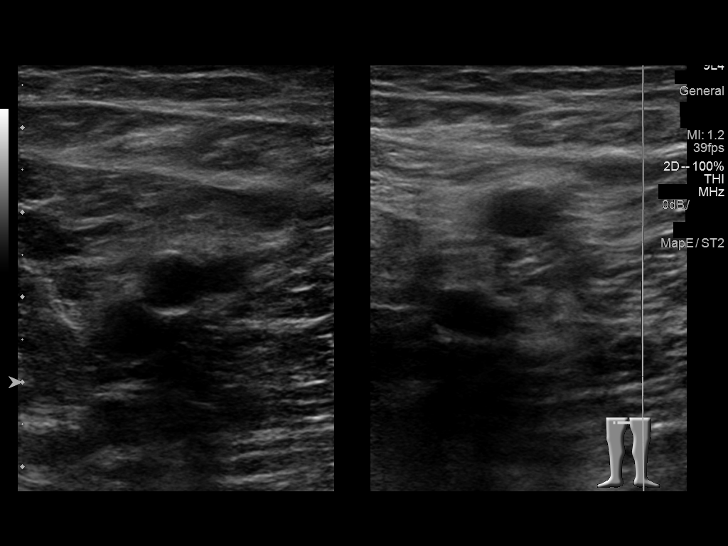
[im 17/32]
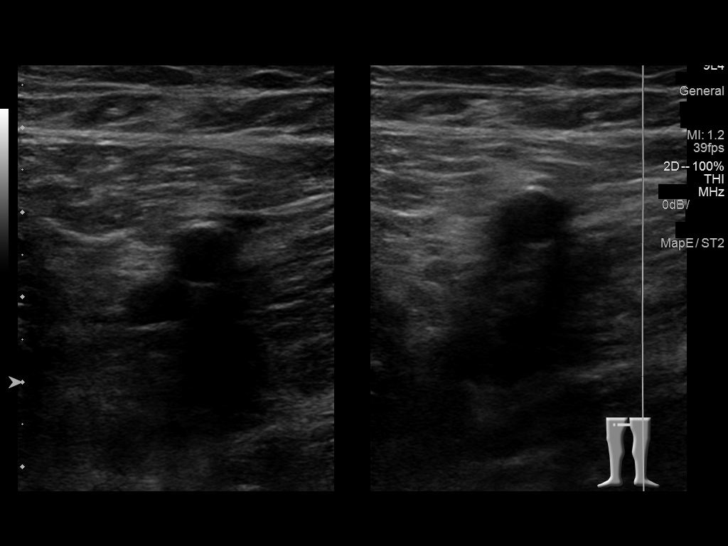
[im 18/32]
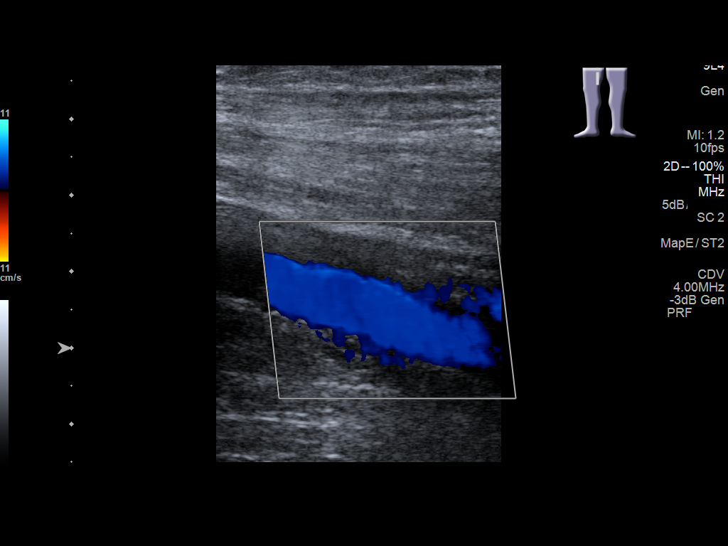
[im 21/32]
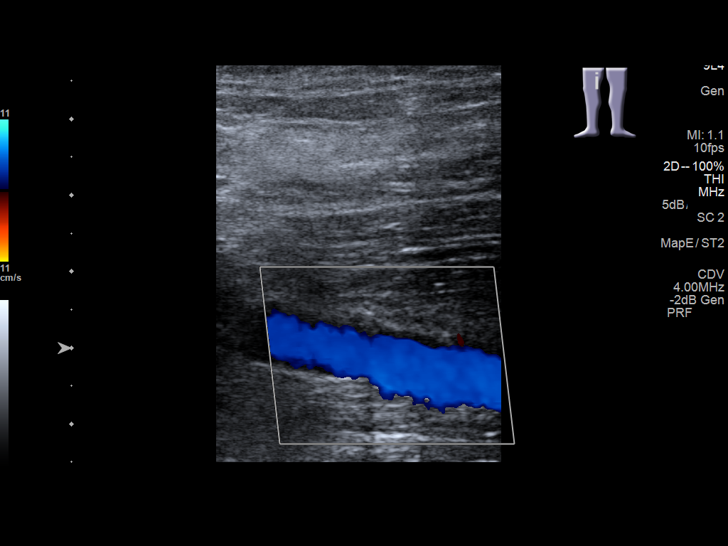
[im 23/32]
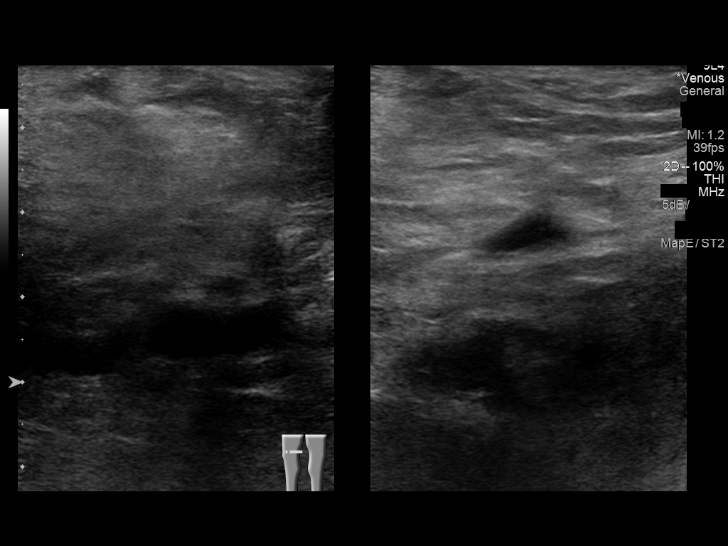
[im 26/32]
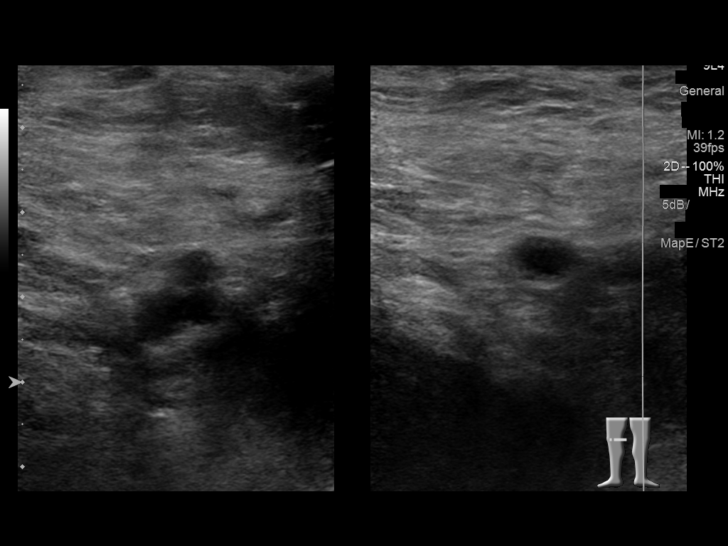
[im 29/32]
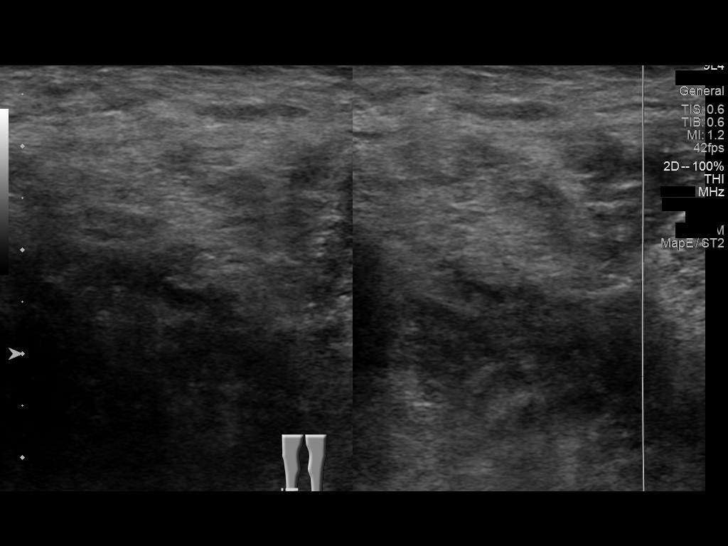
[im 32/32]
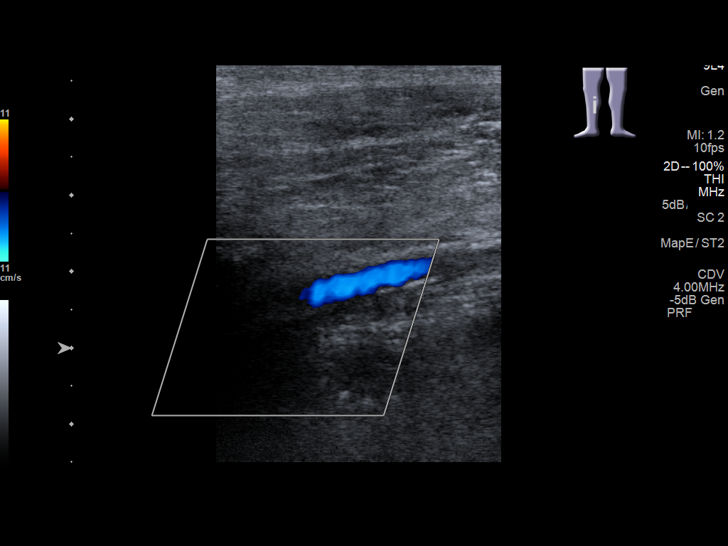

[13 of 24 positions shown; findings below may reference images not displayed]

FINDINGS: Contralateral Common Femoral Vein: Respiratory phasicity is normal
and symmetric with the symptomatic side. No evidence of thrombus.
Normal compressibility.

Common Femoral Vein: No evidence of thrombus. Normal
compressibility, respiratory phasicity and response to augmentation.

Saphenofemoral Junction: No evidence of thrombus. Normal
compressibility and flow on color Doppler imaging.

Profunda Femoral Vein: No evidence of thrombus. Normal
compressibility and flow on color Doppler imaging.

Femoral Vein: No evidence of thrombus. Normal compressibility,
respiratory phasicity and response to augmentation.

Popliteal Vein: No evidence of thrombus. Normal compressibility,
respiratory phasicity and response to augmentation.

Calf Veins: No evidence of thrombus. Normal compressibility and flow
on color Doppler imaging.

Superficial Great Saphenous Vein: No evidence of thrombus. Normal
compressibility and flow on color Doppler imaging.

Venous Reflux:  None.

Other Findings:  None.
IMPRESSION: No evidence of right lower extremity deep venous thrombosis. Left
common femoral vein also patent.

## 2017-09-20 ENCOUNTER — Ambulatory Visit (INDEPENDENT_AMBULATORY_CARE_PROVIDER_SITE_OTHER): Payer: Medicare HMO | Admitting: Internal Medicine

## 2017-09-20 ENCOUNTER — Encounter: Payer: Self-pay | Admitting: Internal Medicine

## 2017-09-20 VITALS — BP 104/70 | HR 94 | Temp 98.1°F | Ht 67.0 in | Wt 175.0 lb

## 2017-09-20 DIAGNOSIS — Z0000 Encounter for general adult medical examination without abnormal findings: Secondary | ICD-10-CM

## 2017-09-20 DIAGNOSIS — E1165 Type 2 diabetes mellitus with hyperglycemia: Secondary | ICD-10-CM | POA: Diagnosis not present

## 2017-09-20 DIAGNOSIS — M7500 Adhesive capsulitis of unspecified shoulder: Secondary | ICD-10-CM

## 2017-09-20 DIAGNOSIS — E1149 Type 2 diabetes mellitus with other diabetic neurological complication: Secondary | ICD-10-CM | POA: Diagnosis not present

## 2017-09-20 DIAGNOSIS — Z23 Encounter for immunization: Secondary | ICD-10-CM

## 2017-09-20 DIAGNOSIS — E039 Hypothyroidism, unspecified: Secondary | ICD-10-CM

## 2017-09-20 DIAGNOSIS — N183 Chronic kidney disease, stage 3 unspecified: Secondary | ICD-10-CM

## 2017-09-20 DIAGNOSIS — Z7189 Other specified counseling: Secondary | ICD-10-CM | POA: Diagnosis not present

## 2017-09-20 DIAGNOSIS — N1831 Chronic kidney disease, stage 3a: Secondary | ICD-10-CM | POA: Insufficient documentation

## 2017-09-20 DIAGNOSIS — E11618 Type 2 diabetes mellitus with other diabetic arthropathy: Secondary | ICD-10-CM | POA: Diagnosis not present

## 2017-09-20 DIAGNOSIS — IMO0002 Reserved for concepts with insufficient information to code with codable children: Secondary | ICD-10-CM

## 2017-09-20 LAB — RENAL FUNCTION PANEL
Albumin: 4.4 g/dL (ref 3.5–5.2)
BUN: 22 mg/dL (ref 6–23)
CO2: 27 mEq/L (ref 19–32)
CREATININE: 1.35 mg/dL (ref 0.40–1.50)
Calcium: 9.9 mg/dL (ref 8.4–10.5)
Chloride: 103 mEq/L (ref 96–112)
GFR: 54.12 mL/min — AB (ref >60.00)
GLUCOSE: 236 mg/dL — AB (ref 70–99)
PHOSPHORUS: 3 mg/dL (ref 2.3–4.6)
Potassium: 4.5 mEq/L (ref 3.5–5.1)
Sodium: 139 mEq/L (ref 135–145)

## 2017-09-20 LAB — TSH: TSH: 3.02 u[IU]/mL (ref 0.35–4.50)

## 2017-09-20 LAB — HEMOGLOBIN A1C: Hgb A1c MFr Bld: 7.7 % — ABNORMAL HIGH (ref 4.6–6.5)

## 2017-09-20 LAB — T4, FREE: Free T4: 0.91 ng/dL (ref 0.60–1.60)

## 2017-09-20 LAB — HM DIABETES FOOT EXAM

## 2017-09-20 NOTE — Assessment & Plan Note (Signed)
GFR 59 Is on ARB Will recheck

## 2017-09-20 NOTE — Progress Notes (Signed)
Hearing Screening   Method: Audiometry   125Hz  250Hz  500Hz  1000Hz  2000Hz  3000Hz  4000Hz  6000Hz  8000Hz   Right ear:   20 20 40  0    Left ear:   20 20 25   0      Visual Acuity Screening   Right eye Left eye Both eyes  Without correction: 20/20 20/20 20/15   With correction:

## 2017-09-20 NOTE — Assessment & Plan Note (Signed)
Seems euthyroid ?Will check labs ?

## 2017-09-20 NOTE — Assessment & Plan Note (Signed)
Will set up with ortho to evaluate the shoulder

## 2017-09-20 NOTE — Progress Notes (Signed)
Subjective:    Patient ID: Sean Koch, male    DOB: 01/09/1941, 77 y.o.   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 aspirin EC 81 MG tablet Take 81 mg by mouth daily.    . Blood Glucose Monitoring Suppl (ONE TOUCH ULTRA SYSTEM KIT) w/Device KIT Use to test blood sugar once daily DX: E11.41 1 each 0  . Calcium Carb-Cholecalciferol 600-800 MG-UNIT TABS Take 2 tablets by mouth daily.     Marland Kitchen glipiZIDE (GLUCOTROL) 10 MG tablet TAKE 1 TABLET BY MOUTH  TWICE A DAY BEFORE MEALS 180 tablet 3  . glucose blood (ONE TOUCH ULTRA TEST) test strip Use as instructed, to test 2-3 times weekly dx: E11.49 100 each 3  . levothyroxine (SYNTHROID, LEVOTHROID) 75 MCG tablet Take 1 tablet (75 mcg total) by mouth daily. 90 tablet 3  . losartan (COZAAR) 100 MG tablet Take 1 tablet (100 mg total) by mouth daily. 90 tablet 3  . metFORMIN (GLUCOPHAGE) 500 MG tablet TAKE 2 TABLETS BY MOUTH TWO TIMES DAILY WITH MEALS 360 tablet 3    . omeprazole (PRILOSEC) 20 MG capsule Take 1 capsule (20 mg total) by mouth daily. 90 capsule 3  . ONE TOUCH LANCETS MISC Use to test blood sugar 2-3 times weekly dx: E11.49 100 each 3  . simvastatin (ZOCOR) 80 MG tablet Take 1 tablet (80 mg total) by mouth at bedtime. 90 tablet 3  . sitaGLIPtin (JANUVIA) 100 MG tablet Take 1 tablet (100 mg total) by mouth daily. 6 tablet 0   No current facility-administered medications on file prior to visit.     Allergies  Allergen Reactions  . Atorvastatin     REACTION: Joint aches    Past Medical History:  Diagnosis Date  . Carpal tunnel syndrome of right wrist   . Cataract   . ED (erectile dysfunction)   . GERD (gastroesophageal reflux disease)   . History of nephrolithiasis   . Hyperlipidemia   . Melanoma ([place])   . NIDDM, uncontrolled, with neuropathy   . Personal history of colonic polyps - 2 small adenomas 07/15/2013  . Renal disorder    kidney stones  . Seronegative rheumatoid arthritis ([place])   . Thyroid disease     Past Surgical History:  Procedure Laterality Date  . CATARACT EXTRACTION W/ INTRAOCULAR LENS IMPLANT Bilateral 11/14 & 2/15  . CHOLECYSTECTOMY    . KNEE ARTHROSCOPY    . MELANOMA EXCISION     upper right arm  . PILONIDAL CYST / SINUS EXCISION    . ROTATOR CUFF REPAIR    . TOE SURGERY  Family History  Problem Relation Age of Onset  . Heart disease Mother   . Diabetes Mother   . Heart disease Brother   . Cancer Neg Hx   . Colon cancer Neg Hx     Social History   Socioeconomic History  . Marital status: Married    Spouse name: Not on file  . Number of children: 2  . Years of education: Not on file  . Highest education level: Not on file  Occupational History  . Occupation: retired IT trainer  . Financial resource strain: Not on file  . Food insecurity:    Worry: Not on file    Inability: Not on file  . Transportation needs:    Medical: Not on file    Non-medical: Not on file   Tobacco Use  . Smoking status: Never Smoker  . Smokeless tobacco: Never Used  Substance and Sexual Activity  . Alcohol use: Yes    Comment: 1 glass wine monthly per pt.  . Drug use: No  . Sexual activity: Not on file  Lifestyle  . Physical activity:    Days per week: Not on file    Minutes per session: Not on file  . Stress: Not on file  Relationships  . Social connections:    Talks on phone: Not on file    Gets together: Not on file    Attends religious service: Not on file    Active member of club or organization: Not on file    Attends meetings of clubs or organizations: Not on file    Relationship status: Not on file  . Intimate partner violence:    Fear of current or ex partner: Not on file    Emotionally abused: Not on file    Physically abused: Not on file    Forced sexual activity: Not on file  Other Topics Concern  . Not on file  Social History Narrative   Has living will    Wife, then son Kc, would be health care POA   Would accept resuscitation. No prolonged artificial ventilation.   Not sure about tube feeds   Review of Systems Did have some brief swelling in neck glands---with sore throat. Better now Appetite is okay Weight down a little Sleeps okay in general--some trouble in past couple of nights Wears seat belt Teeth okay. Keeps up with dentist Sees derm yearly. No suspicious lesions now No heartburn or dysphagia Bowels are fine. No blood Voids okay. Flow is okay and seems to empty No sig other joint issues or back pain    Objective:   Physical Exam  Constitutional: He is oriented to person, place, and time. He appears well-developed. No distress.  HENT:  Mouth/Throat: Oropharynx is clear and moist. No oropharyngeal exudate.  Neck: No thyromegaly present.  Cardiovascular: Normal rate, regular rhythm and normal heart sounds. Exam reveals no gallop.  No murmur heard. 1+ pulse on left--trace on right  Respiratory: Effort normal and breath  sounds normal. No respiratory distress. He has no wheezes. He has no rales.  GI: Soft. There is no tenderness.  Musculoskeletal: He exhibits no edema or tenderness.  Very limited abduction and external rotation of left shoulder  Lymphadenopathy:    He has no cervical adenopathy.  Neurological: He is alert and oriented to person, place, and time.  President--- "Daisy Floro, Obama, Bush" [phone] [id number] Recall 2/3  Fairly normal sensation in feet  Skin:  No foot lesions  Psychiatric: He has  a normal mood and affect. His behavior is normal.           Assessment & Plan:

## 2017-09-20 NOTE — Assessment & Plan Note (Signed)
See social history 

## 2017-09-20 NOTE — Assessment & Plan Note (Signed)
Had been better but may be worse again Did lose some weight Will recheck Mild night foot pain--no Rx needed

## 2017-09-20 NOTE — Assessment & Plan Note (Signed)
I have personally reviewed the Medicare Annual Wellness questionnaire and have noted 1. The patient's medical and social history 2. Their use of alcohol, tobacco or illicit drugs 3. Their current medications and supplements 4. The patient's functional ability including ADL's, fall risks, home safety risks and hearing or visual             impairment. 5. Diet and physical activities 6. Evidence for depression or mood disorders  The patients weight, height, BMI and visual acuity have been recorded in the chart I have made referrals, counseling and provided education to the patient based review of the above and I have provided the pt with a written personalized care plan for preventive services.  I have provided you with a copy of your personalized plan for preventive services. Please take the time to review along with your updated medication list.  Will update flu vaccine today Discussed trying to do some exercise No more cancer screening

## 2017-09-20 NOTE — Addendum Note (Signed)
Addended by: Pilar Grammes on: 09/20/2017 12:14 PM   Modules accepted: Orders

## 2017-09-25 ENCOUNTER — Other Ambulatory Visit: Payer: Self-pay | Admitting: Orthopedic Surgery

## 2017-09-25 DIAGNOSIS — M542 Cervicalgia: Secondary | ICD-10-CM | POA: Diagnosis not present

## 2017-09-25 DIAGNOSIS — M898X1 Other specified disorders of bone, shoulder: Secondary | ICD-10-CM

## 2017-09-25 DIAGNOSIS — M25512 Pain in left shoulder: Secondary | ICD-10-CM | POA: Diagnosis not present

## 2017-09-28 ENCOUNTER — Inpatient Hospital Stay: Admission: RE | Admit: 2017-09-28 | Payer: Medicare HMO | Source: Ambulatory Visit

## 2017-09-29 ENCOUNTER — Encounter (HOSPITAL_COMMUNITY): Payer: Self-pay

## 2017-09-29 ENCOUNTER — Ambulatory Visit (INDEPENDENT_AMBULATORY_CARE_PROVIDER_SITE_OTHER): Payer: Medicare HMO

## 2017-09-29 ENCOUNTER — Encounter (HOSPITAL_COMMUNITY): Payer: Self-pay | Admitting: Emergency Medicine

## 2017-09-29 ENCOUNTER — Other Ambulatory Visit: Payer: Self-pay

## 2017-09-29 ENCOUNTER — Ambulatory Visit (HOSPITAL_COMMUNITY)
Admission: EM | Admit: 2017-09-29 | Discharge: 2017-09-29 | Disposition: A | Discharge status: Home / Self Care | Payer: Medicare HMO | Source: Home / Self Care

## 2017-09-29 ENCOUNTER — Emergency Department (HOSPITAL_COMMUNITY): Payer: Medicare HMO

## 2017-09-29 ENCOUNTER — Emergency Department (HOSPITAL_COMMUNITY)
Admission: EM | Admit: 2017-09-29 | Discharge: 2017-09-30 | Disposition: A | Discharge status: Home / Self Care | Payer: Medicare HMO | Attending: Emergency Medicine | Admitting: Emergency Medicine

## 2017-09-29 DIAGNOSIS — R3129 Other microscopic hematuria: Secondary | ICD-10-CM

## 2017-09-29 DIAGNOSIS — E039 Hypothyroidism, unspecified: Secondary | ICD-10-CM | POA: Insufficient documentation

## 2017-09-29 DIAGNOSIS — N183 Chronic kidney disease, stage 3 (moderate): Secondary | ICD-10-CM | POA: Diagnosis not present

## 2017-09-29 DIAGNOSIS — K5900 Constipation, unspecified: Secondary | ICD-10-CM

## 2017-09-29 DIAGNOSIS — Z79899 Other long term (current) drug therapy: Secondary | ICD-10-CM | POA: Diagnosis not present

## 2017-09-29 DIAGNOSIS — N400 Enlarged prostate without lower urinary tract symptoms: Secondary | ICD-10-CM

## 2017-09-29 DIAGNOSIS — Z7984 Long term (current) use of oral hypoglycemic drugs: Secondary | ICD-10-CM | POA: Insufficient documentation

## 2017-09-29 DIAGNOSIS — I129 Hypertensive chronic kidney disease with stage 1 through stage 4 chronic kidney disease, or unspecified chronic kidney disease: Secondary | ICD-10-CM | POA: Insufficient documentation

## 2017-09-29 DIAGNOSIS — N202 Calculus of kidney with calculus of ureter: Secondary | ICD-10-CM | POA: Diagnosis not present

## 2017-09-29 DIAGNOSIS — N23 Unspecified renal colic: Secondary | ICD-10-CM | POA: Insufficient documentation

## 2017-09-29 DIAGNOSIS — R1031 Right lower quadrant pain: Secondary | ICD-10-CM | POA: Diagnosis present

## 2017-09-29 DIAGNOSIS — R32 Unspecified urinary incontinence: Secondary | ICD-10-CM | POA: Diagnosis not present

## 2017-09-29 DIAGNOSIS — E1122 Type 2 diabetes mellitus with diabetic chronic kidney disease: Secondary | ICD-10-CM | POA: Insufficient documentation

## 2017-09-29 DIAGNOSIS — Z7982 Long term (current) use of aspirin: Secondary | ICD-10-CM | POA: Diagnosis not present

## 2017-09-29 DIAGNOSIS — R109 Unspecified abdominal pain: Secondary | ICD-10-CM | POA: Diagnosis not present

## 2017-09-29 LAB — COMPREHENSIVE METABOLIC PANEL
ALT: 12 U/L (ref 0–44)
AST: 17 U/L (ref 15–41)
Albumin: 4.1 g/dL (ref 3.5–5.0)
Alkaline Phosphatase: 66 U/L (ref 38–126)
Anion gap: 13 (ref 5–15)
BUN: 24 mg/dL — ABNORMAL HIGH (ref 8–23)
CO2: 25 mmol/L (ref 22–32)
Calcium: 9.3 mg/dL (ref 8.9–10.3)
Chloride: 102 mmol/L (ref 98–111)
Creatinine, Ser: 1.81 mg/dL — ABNORMAL HIGH (ref 0.61–1.24)
GFR, EST AFRICAN AMERICAN: 39 mL/min — AB (ref >60)
GFR, EST NON AFRICAN AMERICAN: 34 mL/min — AB (ref >60)
Glucose, Bld: 213 mg/dL — ABNORMAL HIGH (ref 70–99)
POTASSIUM: 4.5 mmol/L (ref 3.5–5.1)
Sodium: 140 mmol/L (ref 135–145)
Total Bilirubin: 1.2 mg/dL (ref 0.3–1.2)
Total Protein: 7.4 g/dL (ref 6.5–8.1)

## 2017-09-29 LAB — CBC
HEMATOCRIT: 48.6 % (ref 39.0–52.0)
Hemoglobin: 15.2 g/dL (ref 13.0–17.0)
MCH: 30.6 pg (ref 26.0–34.0)
MCHC: 31.3 g/dL (ref 30.0–36.0)
MCV: 97.8 fL (ref 78.0–100.0)
PLATELETS: 214 10*3/uL (ref 150–400)
RBC: 4.97 MIL/uL (ref 4.22–5.81)
RDW: 12.8 % (ref 11.5–15.5)
WBC: 19.7 10*3/uL — AB (ref 4.0–10.5)

## 2017-09-29 LAB — POCT URINALYSIS DIP (DEVICE)
Bilirubin Urine: NEGATIVE
Glucose, UA: 250 mg/dL — AB
Leukocytes, UA: NEGATIVE
NITRITE: NEGATIVE
PH: 5 (ref 5.0–8.0)
PROTEIN: 30 mg/dL — AB
Specific Gravity, Urine: >=1.03 (ref 1.005–1.030)
Urobilinogen, UA: 0.2 mg/dL (ref 0.0–1.0)

## 2017-09-29 LAB — URINALYSIS, ROUTINE W REFLEX MICROSCOPIC
BILIRUBIN URINE: NEGATIVE
Glucose, UA: >=500 mg/dL — AB
KETONES UR: 20 mg/dL — AB
LEUKOCYTES UA: NEGATIVE
Nitrite: NEGATIVE
PH: 5 (ref 5.0–8.0)
Protein, ur: 30 mg/dL — AB
Specific Gravity, Urine: 1.023 (ref 1.005–1.030)

## 2017-09-29 LAB — LIPASE, BLOOD: LIPASE: 37 U/L (ref 11–51)

## 2017-09-29 NOTE — Discharge Instructions (Signed)
I'm concerned that with the blood in the urine and the urinary incontinence, you may be having overflow incontinence secondary to enlargement of the prostate.  I would like for you to go to the emergency department for further testing to avoid worsening pain or urinary obstruction.

## 2017-09-29 NOTE — ED Provider Notes (Signed)
Patient placed in Quick Look pathway, seen and evaluated   Chief Complaint: abdominal pain  HPI: Sean Koch is a 77 y.o. male who presents to the ED with abdominal pain, and distention. Patient reports symptoms started 4 days ago. Patient reports no BM in 4 days. Patient also having difficulty with urination. Patient went to Urgent Care and saw Dr. Joseph Art and was sent to the ED for evaluation and CT scan.   ROS: GI: abdominal pain, constipation  GU: difficulty urinating.  Physical Exam:  BP 123/71 (BP Location: Right Arm)   Pulse (!) 102   Temp 98.8 F (37.1 C) (Oral)   Resp 17   Ht 5\' 7"  (1.702 m)   Wt 80.7 kg   SpO2 97%   BMI 27.88 kg/m    Gen: No distress  Neuro: Awake and Alert  Skin: Warm and dry  Abdomen: distended, tender with palpation right side, no guarding or rebound Initiation of care has begun. The patient has been counseled on the process, plan, and necessity for staying for the completion/evaluation, and the remainder of the medical screening examination    Ashley Murrain, NP 09/29/17 2046    Drenda Freeze, MD 09/30/17 1348

## 2017-09-29 NOTE — ED Triage Notes (Signed)
Pt presents with abdominal pain on lower right side, has not been able to make a bowel movement in 4 days.

## 2017-09-29 NOTE — ED Triage Notes (Signed)
Sent from [facility]>  C/o dull RLQ pain and cramping x 3-4 days with constipation.  Last BM 3-4 days ago.  Also reports abd distention.

## 2017-09-29 NOTE — ED Provider Notes (Signed)
[facility]    CSN: [id number] Arrival date & time: 09/29/17  1850     History   Chief Complaint Chief Complaint  Patient presents with  . Abdominal Pain    Right Lower Side  . Constipation    HPI Sean Koch is a 77 y.o. male.   Pt presents with abdominal pain on lower right side, has not been able to have a bowel movement in 4 days.  He recently had a complete physical though no rectal was done.  He has been having urinary incontinence for many months.  No urine was analyzed on his last patient visit.  He states that he is not passing gas.  Patient denies any blood in his urine or dysuria.  Patient has had nausea every night for the last 2 nights along with some emesis.  He is tried taking magnesium citrate as well as an enema, but has had no results.  Normally he goes to the bathroom with a bowel movement after dinner every night.  Patient initially could not recall ever having any abdominal surgery, but after the x-ray was taken, he remembered having his gallbladder removed.     Past Medical History:  Diagnosis Date  . Carpal tunnel syndrome of right wrist   . Cataract   . ED (erectile dysfunction)   . GERD (gastroesophageal reflux disease)   . History of nephrolithiasis   . Hyperlipidemia   . Melanoma ([place])   . NIDDM, uncontrolled, with neuropathy   . Personal history of colonic polyps - 2 small adenomas 07/15/2013  . Renal disorder    kidney stones  . Seronegative rheumatoid arthritis ([place])   . Thyroid disease     Patient Active Problem List   Diagnosis Date Noted  . Chronic renal disease, stage III ([place]) 09/20/2017  . Acute viral pharyngitis 08/22/2017  . Diabetic frozen shoulder associated with type 2 diabetes mellitus ([place]) 03/22/2017  . Sciatica 06/01/2016  . Advance directive discussed with patient 03/26/2014  . Seronegative rheumatoid arthritis ([place])   . BPPV (benign paroxysmal positional vertigo) 09/26/2013  . Arthritis of hand  09/10/2013  . Internal hemorrhoids without mention of complication 35/57/3220  . Personal history of colonic polyps - 2 small adenomas 07/15/2013  . Diabetes, polyneuropathy ([place]) 03/13/2013  . Unspecified constipation 03/01/2012  . Osteoarthritis, knee 05/23/2011  . Routine general medical examination at a health care facility 03/14/2011  . Essential hypertension, benign 03/02/2010  . NEPHROLITHIASIS, HX OF 02/28/2007  . Type 2 diabetes mellitus with neurological manifestations, uncontrolled ([place]) 08/26/2006  . History of melanoma 08/22/2006  . Hypothyroidism 08/22/2006  . Hyperlipemia 08/22/2006  . ERECTILE DYSFUNCTION 08/22/2006  . Reflux esophagitis 08/22/2006    Past Surgical History:  Procedure Laterality Date  . CATARACT EXTRACTION W/ INTRAOCULAR LENS IMPLANT Bilateral 11/14 & 2/15  . CHOLECYSTECTOMY    . KNEE ARTHROSCOPY    . MELANOMA EXCISION     upper right arm  . PILONIDAL CYST / SINUS EXCISION    . ROTATOR CUFF REPAIR    . TOE SURGERY         Home Medications    Prior to Admission medications   Medication Sig Start Date End Date Taking? Authorizing Provider  aspirin EC 81 MG tablet Take 81 mg by mouth daily.    [provider]  Blood Glucose Monitoring Suppl (ONE TOUCH ULTRA SYSTEM KIT) w/Device KIT Use to test blood sugar once daily DX: E11.41 08/26/15   Venia Carbon, MD  Calcium Carb-Cholecalciferol 600-800 MG-UNIT TABS Take 2 tablets by mouth daily.     [provider]  glipiZIDE (GLUCOTROL) 10 MG tablet TAKE 1 TABLET BY MOUTH  TWICE A DAY BEFORE MEALS 03/22/17   Viviana Simpler I, MD  glucose blood (ONE TOUCH ULTRA TEST) test strip Use as instructed, to test 2-3 times weekly dx: E11.49 03/26/14   Venia Carbon, MD  levothyroxine (SYNTHROID, LEVOTHROID) 75 MCG tablet Take 1 tablet (75 mcg total) by mouth daily. 03/22/17   Venia Carbon, MD  losartan (COZAAR) 100 MG tablet Take 1 tablet (100 mg total) by mouth daily. 03/22/17   Venia Carbon, MD  metFORMIN (GLUCOPHAGE) 500 MG tablet TAKE 2 TABLETS BY MOUTH TWO TIMES DAILY WITH MEALS 03/22/17   Venia Carbon, MD  omeprazole (PRILOSEC) 20 MG capsule Take 1 capsule (20 mg total) by mouth daily. 03/22/17   Venia Carbon, MD  ONE TOUCH LANCETS MISC Use to test blood sugar 2-3 times weekly dx: E11.49 03/26/14   Venia Carbon, MD  simvastatin (ZOCOR) 80 MG tablet Take 1 tablet (80 mg total) by mouth at bedtime. 03/22/17   Venia Carbon, MD  sitaGLIPtin (JANUVIA) 100 MG tablet Take 1 tablet (100 mg total) by mouth daily. 04/06/17   Venia Carbon, MD    Family History Family History  Problem Relation Age of Onset  . Heart disease Mother   . Diabetes Mother   . Heart disease Brother   . Cancer Neg Hx   . Colon cancer Neg Hx     Social History Social History   Tobacco Use  . Smoking status: Never Smoker  . Smokeless tobacco: Never Used  Substance Use Topics  . Alcohol use: Yes    Comment: 1 glass wine monthly per pt.  . Drug use: No     Allergies   Atorvastatin   Review of Systems Review of Systems  Constitutional: Negative.   HENT: Negative.   Respiratory: Negative.   Cardiovascular: Negative.   Gastrointestinal: Positive for abdominal distention, abdominal pain, nausea and vomiting.  Genitourinary: Positive for difficulty urinating, enuresis and urgency.  All other systems reviewed and are negative.    Physical Exam Triage Vital Signs ED Triage Vitals [09/29/17 1900]  Enc Vitals Group     BP 136/82     Pulse Rate 73     Resp 20     Temp 98.1 F (36.7 C)     Temp Source Oral     SpO2 94 %     Weight      Height      Head Circumference      Peak Flow      Pain Score      Pain Loc      Pain Edu?      Excl. in [place]?    No data found.  Updated Vital Signs BP 136/82 (BP Location: Left Arm)   Pulse 73   Temp 98.1 F (36.7 C) (Oral)   Resp 20   SpO2 94%    Physical Exam  Constitutional: He is oriented to person, place,  and time. He appears well-developed and well-nourished.  HENT:  Head: Normocephalic and atraumatic.  Eyes: Pupils are equal, round, and reactive to light. EOM are normal.  Cardiovascular: Normal rate, regular rhythm and normal heart sounds.  Pulmonary/Chest: Effort normal and breath sounds normal.  Abdominal: Soft. Normal appearance and bowel sounds are normal. He exhibits distension. There is no  hepatosplenomegaly. There is no tenderness. There is no rigidity, no rebound and no guarding.  Genitourinary: Rectum normal and penis normal. Prostate is enlarged.  Genitourinary Comments: State is markedly enlarged, smooth, and nontender  Neurological: He is alert and oriented to person, place, and time.  Skin: Skin is warm and dry.  Nursing note and vitals reviewed.  [facility] [employer]. [place], [id number] COLONOSCOPY PROCEDURE REPORT PATIENT: Danell, Verno MR#: [id number] BIRTHDATE: 02/08/1940 , 89 yrs. old GENDER: Male ENDOSCOPIST: Gatha Mayer, MD, [facility] REFERRED [id number] Silvio Pate, M.D. PROCEDURE DATE: 07/15/2013 PROCEDURE: Colonoscopy, diagnostic First Screening Colonoscopy - Avg. risk and is 50 yrs. old or older - No. Prior Negative Screening - Now for repeat screening. N/A History of Adenoma - Now for follow-up colonoscopy & has been > or = to 3 yrs. N/A Polyps Removed Today? Yes. ASA CLASS: Class III INDICATIONS:heme-positive stool. MEDICATIONS: Propofol (Diprivan) 240 mg IV, MAC sedation, administered by CRNA, and These medications were titrated to patient response per physician's verbal order DESCRIPTION OF PROCEDURE: After the risks benefits and alternatives of the procedure were thoroughly explained, informed consent was obtained. A digital rectal exam revealed no abnormalities of the rectum, A digital rectal exam revealed no prostatic nodules, and A digital rectal exam revealed the prostate was not enlarged. The LB CF-[id number] Loaner [id number] endoscope  was introduced through the anus and advanced to the cecum, which was identified by both the appendix and ileocecal valve. No adverse events experienced. The quality of the prep was excellent using Suprep The instrument was then slowly withdrawn as the colon was fully examined. COLON FINDINGS: Two sessile polyps measuring 4 and 6 mm in size were found at the splenic flexure and in the descending colon. A polypectomy was performed with a cold snare. The resection was complete and the polyp tissue was completely retrieved. The colon mucosa was otherwise normal. A right colon retroflexion was performed. Retroflexed views revealed no abnormalities. The time to cecum=1 minutes 17 seconds. Withdrawal time=9 minutes 06 seconds. The scope was withdrawn and the procedure completed. COMPLICATIONS: There were no complications. ENDOSCOPIC IMPRESSION: 1. Two sessile polyps measuring 4 and 6 mm in size were found at the splenic flexure and in the descending colon; polypectomy was performed with a cold snare 2. The colon mucosa was otherwise normal RECOMMENDATIONS: 1. Timing of repeat colonoscopy will be determined by pathology findings. 2. Will consider but at 28 and with 2 small polyps may not need another routine colonoscopy. eSigned: Gatha Mayer, MD, [facility] 07/15/2013 9:34 AM cc: Venia Carbon, MD and The Patient Page 1 of 1 Surgical [P], splenic flexure / descending, polyp (2) - TUBULAR ADENOMA (X2). - NO HIGH GRADE DYSPLASIA OR MALIGNANCY. Vicente Males MD Pathologist, Electronic Signature (Case signed 07/18/2013)  UC Treatments / Results  Labs (all labs ordered are listed, but only abnormal results are displayed) Labs Reviewed  POCT URINALYSIS DIP (DEVICE) - Abnormal; Notable for the following components:      Result Value   Glucose, UA 250 (*)    Ketones, ur TRACE (*)    Hgb urine dipstick MODERATE (*)    Protein, ur 30 (*)    All other components within normal limits     EKG None  Radiology Dg Abd 1 View  Result Date: 09/29/2017 CLINICAL DATA:  No bowel movement in 1 week. EXAM: ABDOMEN - 1 VIEW COMPARISON:  None. FINDINGS: The bowel gas pattern is normal. No radio-opaque calculi or other significant radiographic  abnormality are seen. RIGHT upper quadrant surgical clips. With regard to the chief complaint, there does not appear to be a large stool burden. IMPRESSION: Unremarkable one-view abdomen. There does not appear to be significant constipation or fecal impaction. Electronically Signed   By: Staci Righter M.D.   On: 09/29/2017 19:32    Procedures Procedures (including critical care time)  Medications Ordered in UC Medications - No data to display  Initial Impression / Assessment and Plan / UC Course  I have reviewed the triage vital signs and the nursing notes.  Pertinent labs & imaging results that were available during my care of the patient were reviewed by me and considered in my medical decision making (see chart for details).    Final Clinical Impressions(s) / UC Diagnoses   Final diagnoses:  Constipation, unspecified constipation type  Prostate enlargement  Hematuria, microscopic  Urinary incontinence, unspecified type     Discharge Instructions     I'm concerned that with the blood in the urine and the urinary incontinence, you may be having overflow incontinence secondary to enlargement of the prostate.  I would like for you to go to the emergency department for further testing to avoid worsening pain or urinary obstruction.    ED Prescriptions    None     Controlled Substance Prescriptions [place] Controlled Substance Registry consulted? Not Applicable   Robyn Haber, MD 09/29/17 [phone]

## 2017-09-30 DIAGNOSIS — Z7982 Long term (current) use of aspirin: Secondary | ICD-10-CM | POA: Diagnosis not present

## 2017-09-30 DIAGNOSIS — I129 Hypertensive chronic kidney disease with stage 1 through stage 4 chronic kidney disease, or unspecified chronic kidney disease: Secondary | ICD-10-CM | POA: Diagnosis not present

## 2017-09-30 DIAGNOSIS — Z7984 Long term (current) use of oral hypoglycemic drugs: Secondary | ICD-10-CM | POA: Diagnosis not present

## 2017-09-30 DIAGNOSIS — Z79899 Other long term (current) drug therapy: Secondary | ICD-10-CM | POA: Diagnosis not present

## 2017-09-30 DIAGNOSIS — E1122 Type 2 diabetes mellitus with diabetic chronic kidney disease: Secondary | ICD-10-CM | POA: Diagnosis not present

## 2017-09-30 DIAGNOSIS — N23 Unspecified renal colic: Secondary | ICD-10-CM | POA: Diagnosis not present

## 2017-09-30 DIAGNOSIS — E039 Hypothyroidism, unspecified: Secondary | ICD-10-CM | POA: Diagnosis not present

## 2017-09-30 DIAGNOSIS — N183 Chronic kidney disease, stage 3 (moderate): Secondary | ICD-10-CM | POA: Diagnosis not present

## 2017-09-30 MED ORDER — TAMSULOSIN HCL 0.4 MG PO CAPS: 0.4000 mg | ORAL_CAPSULE | Freq: Every day | ORAL | 0 refills | Status: DC

## 2017-09-30 MED ORDER — HYDROMORPHONE HCL 1 MG/ML IJ SOLN
0.5000 mg | Freq: Once | INTRAMUSCULAR | Status: AC
  Administered 2017-09-30: 0.5 mg via INTRAVENOUS
  Filled 2017-09-30: qty 1

## 2017-09-30 MED ORDER — ONDANSETRON HCL 4 MG/2ML IJ SOLN
4.0000 mg | Freq: Once | INTRAMUSCULAR | Status: AC
  Administered 2017-09-30: 4 mg via INTRAVENOUS
  Filled 2017-09-30: qty 2

## 2017-09-30 MED ORDER — OXYCODONE-ACETAMINOPHEN 5-325 MG PO TABS: 1.0000 | ORAL_TABLET | ORAL | 0 refills | Status: DC | PRN

## 2017-09-30 MED ORDER — OXYCODONE-ACETAMINOPHEN 5-325 MG PO TABS
1.0000 | ORAL_TABLET | Freq: Once | ORAL | Status: AC
  Administered 2017-09-30: 1 via ORAL
  Filled 2017-09-30: qty 1

## 2017-09-30 NOTE — ED Notes (Signed)
Patient spouse came to nurse first with a complaint of pain. Family was advised that pt would have to be seen my provider first.

## 2017-09-30 NOTE — ED Notes (Signed)
Patient verbalizes understanding of medications and discharge instructions. No further questions at this time. VSS and patient ambulatory at discharge.   

## 2017-09-30 NOTE — ED Provider Notes (Signed)
[facility] EMERGENCY DEPARTMENT Provider Note   CSN: [id number] Arrival date & time: 09/29/17  2010     History   Chief Complaint Chief Complaint  Patient presents with  . Abdominal Pain  . Constipation    HPI Sean Koch is a 77 y.o. male.  Patient presents to the emergency department for evaluation of abdominal pain.  Patient reports that he has been experiencing constipation for the last 4 days.  Over the last 1 day or so, however, he has developed right-sided abdominal pain.  Pain is predominantly in the right lower, progressively worsening.  No associated nausea, vomiting, diarrhea.  He has not noticed any hematuria, dysuria or urinary frequency.  No associated fever.     Past Medical History:  Diagnosis Date  . Carpal tunnel syndrome of right wrist   . Cataract   . ED (erectile dysfunction)   . GERD (gastroesophageal reflux disease)   . History of nephrolithiasis   . Hyperlipidemia   . Melanoma ([place])   . NIDDM, uncontrolled, with neuropathy   . Personal history of colonic polyps - 2 small adenomas 07/15/2013  . Renal disorder    kidney stones  . Seronegative rheumatoid arthritis ([place])   . Thyroid disease     Patient Active Problem List   Diagnosis Date Noted  . Chronic renal disease, stage III ([place]) 09/20/2017  . Acute viral pharyngitis 08/22/2017  . Diabetic frozen shoulder associated with type 2 diabetes mellitus ([place]) 03/22/2017  . Sciatica 06/01/2016  . Advance directive discussed with patient 03/26/2014  . Seronegative rheumatoid arthritis ([place])   . BPPV (benign paroxysmal positional vertigo) 09/26/2013  . Arthritis of hand 09/10/2013  . Internal hemorrhoids without mention of complication 76/16/0737  . Personal history of colonic polyps - 2 small adenomas 07/15/2013  . Diabetes, polyneuropathy ([place]) 03/13/2013  . Unspecified constipation 03/01/2012  . Osteoarthritis, knee 05/23/2011  . Routine general medical examination at a  health care facility 03/14/2011  . Essential hypertension, benign 03/02/2010  . NEPHROLITHIASIS, HX OF 02/28/2007  . Type 2 diabetes mellitus with neurological manifestations, uncontrolled ([place]) 08/26/2006  . History of melanoma 08/22/2006  . Hypothyroidism 08/22/2006  . Hyperlipemia 08/22/2006  . ERECTILE DYSFUNCTION 08/22/2006  . Reflux esophagitis 08/22/2006    Past Surgical History:  Procedure Laterality Date  . CATARACT EXTRACTION W/ INTRAOCULAR LENS IMPLANT Bilateral 11/14 & 2/15  . CHOLECYSTECTOMY    . KNEE ARTHROSCOPY    . MELANOMA EXCISION     upper right arm  . PILONIDAL CYST / SINUS EXCISION    . ROTATOR CUFF REPAIR    . TOE SURGERY          Home Medications    Prior to Admission medications   Medication Sig Start Date End Date Taking? Authorizing Provider  aspirin EC 81 MG tablet Take 81 mg by mouth daily.   Yes [provider]  Calcium Carb-Cholecalciferol 600-800 MG-UNIT TABS Take 2 tablets by mouth daily.    Yes [provider]  glipiZIDE (GLUCOTROL) 10 MG tablet TAKE 1 TABLET BY MOUTH  TWICE A DAY BEFORE MEALS Patient taking differently: Take 10 mg by mouth 2 (two) times daily before a meal.  03/22/17  Yes Venia Carbon, MD  levothyroxine (SYNTHROID, LEVOTHROID) 75 MCG tablet Take 1 tablet (75 mcg total) by mouth daily. 03/22/17  Yes Venia Carbon, MD  losartan (COZAAR) 100 MG tablet Take 1 tablet (100 mg total) by mouth daily. 03/22/17  Yes Venia Carbon,  MD  metFORMIN (GLUCOPHAGE) 500 MG tablet TAKE 2 TABLETS BY MOUTH TWO TIMES DAILY WITH MEALS Patient taking differently: Take 1,000 mg by mouth 2 (two) times daily with a meal.  03/22/17  Yes Venia Carbon, MD  omeprazole (PRILOSEC) 20 MG capsule Take 1 capsule (20 mg total) by mouth daily. 03/22/17  Yes Venia Carbon, MD  simvastatin (ZOCOR) 80 MG tablet Take 1 tablet (80 mg total) by mouth at bedtime. 03/22/17  Yes Venia Carbon, MD  sitaGLIPtin (JANUVIA) 100 MG tablet  Take 1 tablet (100 mg total) by mouth daily. 04/06/17  Yes Venia Carbon, MD  Blood Glucose Monitoring Suppl (ONE TOUCH ULTRA SYSTEM KIT) w/Device KIT Use to test blood sugar once daily DX: E11.41 08/26/15   Viviana Simpler I, MD  glucose blood (ONE TOUCH ULTRA TEST) test strip Use as instructed, to test 2-3 times weekly dx: E11.49 03/26/14   Venia Carbon, MD  ONE TOUCH LANCETS MISC Use to test blood sugar 2-3 times weekly dx: E11.49 03/26/14   Venia Carbon, MD  oxyCODONE-acetaminophen (PERCOCET) 5-325 MG tablet Take 1-2 tablets by mouth every 4 (four) hours as needed for moderate pain or severe pain. 09/30/17   Orpah Greek, MD  tamsulosin (FLOMAX) 0.4 MG CAPS capsule Take 1 capsule (0.4 mg total) by mouth daily. 09/30/17   Orpah Greek, MD    Family History Family History  Problem Relation Age of Onset  . Heart disease Mother   . Diabetes Mother   . Heart disease Brother   . Cancer Neg Hx   . Colon cancer Neg Hx     Social History Social History   Tobacco Use  . Smoking status: Never Smoker  . Smokeless tobacco: Never Used  Substance Use Topics  . Alcohol use: Yes    Comment: 1 glass wine monthly per pt.  . Drug use: No     Allergies   Atorvastatin   Review of Systems Review of Systems  Gastrointestinal: Positive for abdominal pain and constipation.  All other systems reviewed and are negative.    Physical Exam Updated Vital Signs BP (!) 147/80   Pulse 70   Temp 99.1 F (37.3 C) (Oral)   Resp 18   Ht _0  (1.702 m)   Wt 80.7 kg   SpO2 90%   BMI 27.88 kg/m   Physical Exam  Constitutional: He is oriented to person, place, and time. He appears well-developed and well-nourished. No distress.  HENT:  Head: Normocephalic and atraumatic.  Right Ear: Hearing normal.  Left Ear: Hearing normal.  Nose: Nose normal.  Mouth/Throat: Oropharynx is clear and moist and mucous membranes are normal.  Eyes: Pupils are equal, round, and  reactive to light. Conjunctivae and EOM are normal.  Neck: Normal range of motion. Neck supple.  Cardiovascular: Regular rhythm, S1 normal and S2 normal. Exam reveals no gallop and no friction rub.  No murmur heard. Pulmonary/Chest: Effort normal and breath sounds normal. No respiratory distress. He exhibits no tenderness.  Abdominal: Soft. Normal appearance and bowel sounds are normal. There is no hepatosplenomegaly. There is tenderness in the right lower quadrant. There is no rebound, no guarding, no tenderness at McBurney's point and negative Murphy's sign. No hernia.  Musculoskeletal: Normal range of motion.  Neurological: He is alert and oriented to person, place, and time. He has normal strength. No cranial nerve deficit or sensory deficit. Coordination normal. GCS eye subscore is 4. GCS verbal subscore is 5.  GCS motor subscore is 6.  Skin: Skin is warm, dry and intact. No rash noted. No cyanosis.  Psychiatric: He has a normal mood and affect. His speech is normal and behavior is normal. Thought content normal.  Nursing note and vitals reviewed.    ED Treatments / Results  Labs (all labs ordered are listed, but only abnormal results are displayed) Labs Reviewed  COMPREHENSIVE METABOLIC PANEL - Abnormal; Notable for the following components:      Result Value   Glucose, Bld 213 (*)    BUN 24 (*)    Creatinine, Ser 1.81 (*)    GFR calc non Af Amer 34 (*)    GFR calc Af Amer 39 (*)    All other components within normal limits  CBC - Abnormal; Notable for the following components:   WBC 19.7 (*)    All other components within normal limits  URINALYSIS, ROUTINE W REFLEX MICROSCOPIC - Abnormal; Notable for the following components:   APPearance HAZY (*)    Glucose, UA >=500 (*)    Hgb urine dipstick MODERATE (*)    Ketones, ur 20 (*)    Protein, ur 30 (*)    Bacteria, UA RARE (*)    All other components within normal limits  LIPASE, BLOOD    EKG None  Radiology Ct Abdomen  Pelvis Wo Contrast  Result Date: 09/29/2017 CLINICAL DATA:  [age]-year-old male with right lower quadrant abdominal pain. EXAM: CT ABDOMEN AND PELVIS WITHOUT CONTRAST TECHNIQUE: Multidetector CT imaging of the abdomen and pelvis was performed following the standard protocol without IV contrast. COMPARISON:  Abdominal radiograph dated 09/29/2017 and CT dated 02/14/2012 FINDINGS: Evaluation of this exam is limited in the absence of intravenous contrast. Lower chest: The visualized lung bases are clear. There is coronary vascular calcification. No intra-abdominal free air or free fluid. Hepatobiliary: The liver is unremarkable. No intrahepatic biliary ductal dilatation. Cholecystectomy. Pancreas: Unremarkable. No pancreatic ductal dilatation or surrounding inflammatory changes. Spleen: Normal in size without focal abnormality. Adrenals/Urinary Tract: The adrenal glands are unremarkable. There is a 4 mm distal right ureteral calculus with mild right hydronephrosis. Multiple additional smaller nonobstructing bilateral renal calculi measure up to 3 mm in the upper pole of the right kidney. There is no hydronephrosis on the left. The left ureter and urinary bladder appear unremarkable. Stomach/Bowel: There is no bowel obstruction or active inflammation. Normal appendix. Vascular/Lymphatic: There is advanced aortoiliac atherosclerotic disease. There is a 2.6 cm infrarenal aortic ectasia. No portal venous gas. There is no adenopathy. Reproductive: Enlarged prostate gland measuring 5.5 cm in transverse axial diameter. Other: None Musculoskeletal: Degenerative changes of the spine. No acute osseous pathology. IMPRESSION: 1. A 4 mm distal right ureteral calculus with mild right hydronephrosis. Additional nonobstructing bilateral renal calculi noted. 2. A 2.6 cm infrarenal aortic ectasia. Ectatic abdominal aorta at risk for aneurysm development. Recommend followup by ultrasound in 5 years. This recommendation follows ACR  consensus guidelines: White Paper of the ACR Incidental Findings Committee II on Vascular Findings. J Am Coll Radiol 2013; 10:789-794. 3. Advanced Aortic Atherosclerosis (ICD10-I70.0). Electronically Signed   By: Anner Crete M.D.   On: 09/29/2017 23:03   Dg Abd 1 View  Result Date: 09/29/2017 CLINICAL DATA:  No bowel movement in 1 week. EXAM: ABDOMEN - 1 VIEW COMPARISON:  None. FINDINGS: The bowel gas pattern is normal. No radio-opaque calculi or other significant radiographic abnormality are seen. RIGHT upper quadrant surgical clips. With regard to the chief complaint, there does not  appear to be a large stool burden. IMPRESSION: Unremarkable one-view abdomen. There does not appear to be significant constipation or fecal impaction. Electronically Signed   By: Staci Righter M.D.   On: 09/29/2017 19:32    Procedures Procedures (including critical care time)  Medications Ordered in ED Medications  HYDROmorphone (DILAUDID) injection 0.5 mg (0.5 mg Intravenous Given 09/30/17 0447)  ondansetron (ZOFRAN) injection 4 mg (4 mg Intravenous Given 09/30/17 0447)  oxyCODONE-acetaminophen (PERCOCET/ROXICET) 5-325 MG per tablet 1 tablet (1 tablet Oral Given 09/30/17 2549)     Initial Impression / Assessment and Plan / ED Course  I have reviewed the triage vital signs and the nursing notes.  Pertinent labs & imaging results that were available during my care of the patient were reviewed by me and considered in my medical decision making (see chart for details).  Clinical Course as of Oct 01 719  Fri Sep 29, 2017  2157 Informed by RN that patient's CT was not ordered by quick look provider as discussed earlier.  I have been asked for the CT scan.  It appears that the patient was sent from his primary care doctor for a CT of his abdomen to rule out acute causes of urinary retention.  Patient's CMP is still pending at this time, I have ordered the scan but placed in the notes to await results of CMP, in  particular the creatinine to ensure patient is not experiencing AKI prior to contrast media being administered.   [BM]  2212 CMP resulted with creatinine of 1.81.  CT abdomen with contrast canceled, discussed with Dr. Ralene Bathe, CT abdomen pelvis without contrast ordered.   [BM]    Clinical Course User Index [BM] Deliah Boston, PA-C    Patient presents to the emergency department for evaluation of abdominal pain.  He was referred here by urgent care for further evaluation of his right lower quadrant abdominal pain.  Patient does have a leukocytosis.  He also has an acute kidney injury.  Creatinine is 1.8 today, was 1.35 ten days ago.  She underwent CT scan.  CT was performed without IV contrast because of his renal function.  This does show an obstructing distal ureteral stone on the right that explains his pain.   I did discuss the patient with Dr. Diona Fanti, on-call for urology.  Specifically we discussed the fact that he does have a bump in his creatinine with a kidney stone.  Dr. Diona Fanti did not feel that we needed to further investigate this tonight if the patient could get quick follow-up in his office.  He recommends pain control and follow-up at [facility] u[facility].  I discussed this with the patient.  After a dose of Dilaudid he is pain-free and comfortable.  He is comfortable being discharged and follow-up.  He understands that he should come back to the ER for any fever or uncontrolled pain.  Final Clinical Impressions(s) / ED Diagnoses   Final diagnoses:  Ureteral colic    ED Discharge Orders         Ordered    oxyCODONE-acetaminophen (PERCOCET) 5-325 MG tablet  Every 4 hours PRN     09/30/17 0615    tamsulosin (FLOMAX) 0.4 MG CAPS capsule  Daily,   Status:  Discontinued     09/30/17 0615    tamsulosin (FLOMAX) 0.4 MG CAPS capsule  Daily     09/30/17 0616           Orpah Greek, MD 09/30/17 [phone]

## 2017-10-02 DIAGNOSIS — M25512 Pain in left shoulder: Secondary | ICD-10-CM | POA: Diagnosis not present

## 2017-10-05 ENCOUNTER — Other Ambulatory Visit: Payer: Medicare HMO

## 2017-10-06 ENCOUNTER — Other Ambulatory Visit: Payer: Medicare HMO

## 2017-10-06 ENCOUNTER — Ambulatory Visit
Admission: RE | Admit: 2017-10-06 | Discharge: 2017-10-06 | Disposition: A | Discharge status: Home / Self Care | Payer: Medicare HMO | Source: Ambulatory Visit | Attending: Orthopedic Surgery | Admitting: Orthopedic Surgery

## 2017-10-06 ENCOUNTER — Telehealth: Payer: Self-pay | Admitting: Internal Medicine

## 2017-10-06 DIAGNOSIS — M898X1 Other specified disorders of bone, shoulder: Secondary | ICD-10-CM

## 2017-10-06 DIAGNOSIS — R918 Other nonspecific abnormal finding of lung field: Secondary | ICD-10-CM | POA: Diagnosis not present

## 2017-10-06 MED ORDER — OXYCODONE-ACETAMINOPHEN 5-325 MG PO TABS: 1.0000 | ORAL_TABLET | ORAL | 0 refills | Status: DC | PRN

## 2017-10-06 MED ORDER — TAMSULOSIN HCL 0.4 MG PO CAPS: 0.4000 mg | ORAL_CAPSULE | Freq: Every day | ORAL | 1 refills | Status: DC

## 2017-10-06 MED ORDER — IOPAMIDOL (ISOVUE-300) INJECTION 61%
50.0000 mL | Freq: Once | INTRAVENOUS | Status: AC | PRN
  Administered 2017-10-06: 50 mL via INTRAVENOUS

## 2017-10-06 NOTE — Telephone Encounter (Signed)
Pt. Seen in ED with kidney stone 09/29/17.Requesting refill of Percocet and Flomax be called in to [facility]. Please advise pt. Is out of both medications.

## 2017-10-06 NOTE — Telephone Encounter (Signed)
Left message for pt to call the office. Dr Silvio Pate sent the meds.

## 2017-10-06 NOTE — Telephone Encounter (Signed)
Please make sure that he is scheduled to see the urologist---ASAP if he is still having pain

## 2017-10-06 NOTE — Telephone Encounter (Signed)
Copied from [place] [id number]. Topic: Quick Communication - Rx Refill/Question >> Oct 06, 2017 10:57 AM Sheppard Coil, [employer] L wrote: Medication: oxyCODONE-acetaminophen (PERCOCET) 5-325 MG tablet tamsulosin (FLOMAX) 0.4 MG CAPS capsule  Has the patient contacted their pharmacy? Yes - states they need refills (Agent: If no, request that the patient contact the pharmacy for the refill.) (Agent: If yes, when and what did the pharmacy advise?)  Preferred Pharmacy (with phone number or street name): [place], [place] - [place] [phone] (Phone) [phone] (Fax)  Agent: Please be advised that RX refills may take up to 3 business days. We ask that you follow-up with your pharmacy.

## 2017-10-09 ENCOUNTER — Ambulatory Visit: Payer: Self-pay | Admitting: *Deleted

## 2017-10-09 NOTE — Telephone Encounter (Signed)
Rectal bleeding for "a couple of days". Stated the blood is in the toilet water only and a small amount noted without a stool, also. Stated he had been constipated one week ago and he also has a stone he has not passed, over one week now. Was seen in the ER on 2/95/62 for ureteral colic and constipation. Stated he feels weak with all the kidney and constipation issues occurring for over one week now. Denies fever, diarrhea/N/V/dizziness/abdominal pain. Advice care reviewed per protocol. Symptoms discussed that would require immediate attention. Appointment made with PCP for tomorrow.  Reason for Disposition . MILD rectal bleeding (more than just a few drops or streaks)  Answer Assessment - Initial Assessment Questions 1. APPEARANCE of BLOOD: "What color is it?" "Is it passed separately, on the surface of the stool, or mixed in with the stool?"      Mixed in the water 2. AMOUNT: "How much blood was passed?"      Small amount 3. FREQUENCY: "How many times has blood been passed with the stools?"      3-4 4. ONSET: "When was the blood first seen in the stools?" (Days or weeks)      2 days ago 5. DIARRHEA: "Is there also some diarrhea?" If so, ask: "How many diarrhea stools were passed in past 24 hours?"      no 6. CONSTIPATION: "Do you have constipation?" If so, "How bad is it?"     One week ago 7. RECURRENT SYMPTOMS: "Have you had blood in your stools before?" If so, ask: "When was the last time?" and "What happened that time?"      no 8. BLOOD THINNERS: "Do you take any blood thinners?" (e.g., Coumadin/warfarin, Pradaxa/dabigatran, aspirin)    Low dose asa daily 9. OTHER SYMPTOMS: "Do you have any other symptoms?"  (e.g., abdominal pain, vomiting, dizziness, fever)    Feels weak 10. PREGNANCY: "Is there any chance you are pregnant?" "When was your last menstrual period?"       na  Protocols used: RECTAL BLEEDING-A-AH

## 2017-10-09 NOTE — Telephone Encounter (Signed)
Spoke to pt. He said last night he stopped having pain. He thinks he passed the stone.

## 2017-10-10 ENCOUNTER — Ambulatory Visit (INDEPENDENT_AMBULATORY_CARE_PROVIDER_SITE_OTHER): Payer: Medicare HMO | Admitting: Internal Medicine

## 2017-10-10 ENCOUNTER — Encounter: Payer: Self-pay | Admitting: Internal Medicine

## 2017-10-10 VITALS — BP 102/60 | HR 109 | Temp 98.0°F | Ht 67.0 in | Wt 170.0 lb

## 2017-10-10 DIAGNOSIS — K625 Hemorrhage of anus and rectum: Secondary | ICD-10-CM

## 2017-10-10 LAB — CBC
HCT: 43.8 % (ref 39.0–52.0)
Hemoglobin: 14.6 g/dL (ref 13.0–17.0)
MCHC: 33.5 g/dL (ref 30.0–36.0)
MCV: 91.6 fl (ref 78.0–100.0)
Platelets: 293 10*3/uL (ref 150.0–400.0)
RBC: 4.78 Mil/uL (ref 4.22–5.81)
RDW: 12.9 % (ref 11.5–15.5)
WBC: 14.1 10*3/uL — ABNORMAL HIGH (ref 4.0–10.5)

## 2017-10-10 LAB — RENAL FUNCTION PANEL
Albumin: 4 g/dL (ref 3.5–5.2)
BUN: 22 mg/dL (ref 6–23)
CO2: 28 mEq/L (ref 19–32)
CREATININE: 1.41 mg/dL (ref 0.40–1.50)
Calcium: 10 mg/dL (ref 8.4–10.5)
Chloride: 99 mEq/L (ref 96–112)
GFR: 51.47 mL/min — ABNORMAL LOW (ref >60.00)
GLUCOSE: 336 mg/dL — AB (ref 70–99)
POTASSIUM: 4.5 meq/L (ref 3.5–5.1)
Phosphorus: 3.4 mg/dL (ref 2.3–4.6)
SODIUM: 137 meq/L (ref 135–145)

## 2017-10-10 MED ORDER — HYDROCORTISONE 2.5 % EX CREA: TOPICAL_CREAM | Freq: Three times a day (TID) | CUTANEOUS | 3 refills | Status: DC | PRN

## 2017-10-10 NOTE — Progress Notes (Signed)
Subjective:    Patient ID: Sean Koch, male    DOB: 1940/10/28, 77 y.o.   MRN: [id number]  HPI Here due to rectal bleeding  Started about 2 weeks ago Put a pad by rectum--it gets red Not in stool that he has seen  Some rectal pain Using Tuck's pad to clear (witch hazel)  Has had some "blow out" BMs liquidy at times No black color No abdominal pain No fever  Current Outpatient Medications on File Prior to Visit  Medication Sig Dispense Refill  . aspirin EC 81 MG tablet Take 81 mg by mouth daily.    . Blood Glucose Monitoring Suppl (ONE TOUCH ULTRA SYSTEM KIT) w/Device KIT Use to test blood sugar once daily DX: E11.41 1 each 0  . Calcium Carb-Cholecalciferol 600-800 MG-UNIT TABS Take 2 tablets by mouth daily.     Marland Kitchen glipiZIDE (GLUCOTROL) 10 MG tablet TAKE 1 TABLET BY MOUTH  TWICE A DAY BEFORE MEALS (Patient taking differently: Take 10 mg by mouth 2 (two) times daily before a meal. ) 180 tablet 3  . glucose blood (ONE TOUCH ULTRA TEST) test strip Use as instructed, to test 2-3 times weekly dx: E11.49 100 each 3  . levothyroxine (SYNTHROID, LEVOTHROID) 75 MCG tablet Take 1 tablet (75 mcg total) by mouth daily. 90 tablet 3  . losartan (COZAAR) 100 MG tablet Take 1 tablet (100 mg total) by mouth daily. 90 tablet 3  . metFORMIN (GLUCOPHAGE) 500 MG tablet TAKE 2 TABLETS BY MOUTH TWO TIMES DAILY WITH MEALS (Patient taking differently: Take 1,000 mg by mouth 2 (two) times daily with a meal. ) 360 tablet 3  . omeprazole (PRILOSEC) 20 MG capsule Take 1 capsule (20 mg total) by mouth daily. 90 capsule 3  . ONE TOUCH LANCETS MISC Use to test blood sugar 2-3 times weekly dx: E11.49 100 each 3  . oxyCODONE-acetaminophen (PERCOCET) 5-325 MG tablet Take 1-2 tablets by mouth every 4 (four) hours as needed for moderate pain or severe pain. 20 tablet 0  . simvastatin (ZOCOR) 80 MG tablet Take 1 tablet (80 mg total) by mouth at bedtime. 90 tablet 3  . sitaGLIPtin (JANUVIA) 100 MG tablet Take 1  tablet (100 mg total) by mouth daily. 6 tablet 0  . tamsulosin (FLOMAX) 0.4 MG CAPS capsule Take 1 capsule (0.4 mg total) by mouth daily. 30 capsule 1   No current facility-administered medications on file prior to visit.     Allergies  Allergen Reactions  . Atorvastatin     REACTION: Joint aches    Past Medical History:  Diagnosis Date  . Carpal tunnel syndrome of right wrist   . Cataract   . ED (erectile dysfunction)   . GERD (gastroesophageal reflux disease)   . History of nephrolithiasis   . Hyperlipidemia   . Melanoma ([place])   . NIDDM, uncontrolled, with neuropathy   . Personal history of colonic polyps - 2 small adenomas 07/15/2013  . Renal disorder    kidney stones  . Seronegative rheumatoid arthritis ([place])   . Thyroid disease     Past Surgical History:  Procedure Laterality Date  . CATARACT EXTRACTION W/ INTRAOCULAR LENS IMPLANT Bilateral 11/14 & 2/15  . CHOLECYSTECTOMY    . KNEE ARTHROSCOPY    . MELANOMA EXCISION     upper right arm  . PILONIDAL CYST / SINUS EXCISION    . ROTATOR CUFF REPAIR    . TOE SURGERY      Family History  Problem Relation  Age of Onset  . Heart disease Mother   . Diabetes Mother   . Heart disease Brother   . Cancer Neg Hx   . Colon cancer Neg Hx     Social History   Socioeconomic History  . Marital status: Married    Spouse name: Not on file  . Number of children: 2  . Years of education: Not on file  . Highest education level: Not on file  Occupational History  . Occupation: retired IT trainer  . Financial resource strain: Not on file  . Food insecurity:    Worry: Not on file    Inability: Not on file  . Transportation needs:    Medical: Not on file    Non-medical: Not on file  Tobacco Use  . Smoking status: Never Smoker  . Smokeless tobacco: Never Used  Substance and Sexual Activity  . Alcohol use: Yes    Comment: 1 glass wine monthly per pt.  . Drug use: No  . Sexual activity: Not on file    Lifestyle  . Physical activity:    Days per week: Not on file    Minutes per session: Not on file  . Stress: Not on file  Relationships  . Social connections:    Talks on phone: Not on file    Gets together: Not on file    Attends religious service: Not on file    Active member of club or organization: Not on file    Attends meetings of clubs or organizations: Not on file    Relationship status: Not on file  . Intimate partner violence:    Fear of current or ex partner: Not on file    Emotionally abused: Not on file    Physically abused: Not on file    Forced sexual activity: Not on file  Other Topics Concern  . Not on file  Social History Narrative   Has living will    Wife, then son Abdulah, would be health care POA   Would accept resuscitation. No prolonged artificial ventilation.   Not sure about tube feeds   Review of Systems  Appetite is good Weight id down some No dizziness  Never saw kidney stone pass---but symptoms seem to be relieved     Objective:   Physical Exam  GI: Soft. He exhibits no distension. There is no tenderness. There is no rebound and no guarding.  Genitourinary:  Genitourinary Comments: Blood on gauze at rectum Has hemorrhoid that is very tender ~8 o'clock Can't get internal exam done due to the pain (and the hemorrhoid is mostly inside)           Assessment & Plan:

## 2017-10-10 NOTE — Assessment & Plan Note (Signed)
Has mostly internal hemorrhoid that is probably the source of the bleeding May be thrombosed--but I can't get to it to incise Discussed options  For now, try HC 2.5% cream tid If ongoing pain, would send to general surgeon for incision If continued diarrhea and weight doesn't normalize--back to GI Will check CBC---GI eval if considerable drop as well

## 2017-10-12 ENCOUNTER — Telehealth: Payer: Self-pay | Admitting: *Deleted

## 2017-10-12 DIAGNOSIS — K625 Hemorrhage of anus and rectum: Secondary | ICD-10-CM

## 2017-10-12 NOTE — Telephone Encounter (Signed)
Copied from [place] [id number]. Topic: Referral - Request >> Oct 12, 2017  9:00 AM Alfredia Ferguson R wrote: Patient is requesting a surgeon he states his hemorrhoids aren't getting any better.  CB# [phone]

## 2017-10-12 NOTE — Telephone Encounter (Signed)
Referral placed.

## 2017-10-16 ENCOUNTER — Telehealth: Payer: Self-pay | Admitting: Internal Medicine

## 2017-10-16 MED ORDER — VALSARTAN 160 MG PO TABS: 160.0000 mg | ORAL_TABLET | Freq: Every day | ORAL | 1 refills | Status: DC

## 2017-10-16 NOTE — Telephone Encounter (Signed)
Spoken to patient and sent the medication has instructed.

## 2017-10-16 NOTE — Telephone Encounter (Signed)
Please find out his local pharmacy and see if they have the losartan. If not, find out what other ARBs they have (like valsartan or telmisartan). I don't want him to miss any other days of the medication

## 2017-10-16 NOTE — Telephone Encounter (Signed)
Please call pharmacy and him Switch to valsartan 160mg  daily Send #30 x 1 locally and send #90 x 3 to mail away if he wishes

## 2017-10-16 NOTE — Telephone Encounter (Signed)
Called patient local pharmacy which is [facility]. She stated that they valsartan and they do have telmisartan at the moment but it has been on back order off and on.

## 2017-10-16 NOTE — Telephone Encounter (Signed)
Copied from [place] [id number]. Topic: Quick Communication - Rx Refill/Question >> Oct 16, 2017 10:49 AM Margot Ables wrote: Medication: losartan (COZAAR) 100 MG tablet  - pt received letter from his mail order about recall - pt asking what to do next - pt stopped taking 2 days ago - if changed please send 90 day supply thru mail order - will it hurt to go w/o medicaton for 7-10 days? Please call  Has the patient contacted their pharmacy? Yes  Preferred Pharmacy (with phone number or street name): [place], [place] [phone] (Phone) [phone] (Fax)

## 2017-10-16 NOTE — Telephone Encounter (Signed)
Patient called in and wants med sent to [facility] on [address] due to price being cheaper.

## 2017-10-17 DIAGNOSIS — K611 Rectal abscess: Secondary | ICD-10-CM | POA: Diagnosis not present

## 2017-10-17 MED ORDER — VALSARTAN 160 MG PO TABS: 160.0000 mg | ORAL_TABLET | Freq: Every day | ORAL | 1 refills | Status: DC

## 2017-10-17 NOTE — Telephone Encounter (Signed)
Re-sent Med[facility] to [facility].

## 2017-10-18 MED ORDER — VALSARTAN 160 MG PO TABS: 160.0000 mg | ORAL_TABLET | Freq: Every day | ORAL | 3 refills | Status: DC

## 2017-10-18 NOTE — Addendum Note (Signed)
Addended by: Jacqualin Combes on: 10/18/2017 04:38 PM   Modules accepted: Orders

## 2017-10-20 ENCOUNTER — Telehealth: Payer: Self-pay

## 2017-10-20 NOTE — Telephone Encounter (Signed)
plz touch base with patient - recommend he touch base with pharmacy for affordable covered alternatives between telmisartan and olmesartan then let us know which is preferred.

## 2017-10-20 NOTE — Telephone Encounter (Signed)
Copied from [place] [id number]. Topic: General - Other >> Oct 20, 2017  3:43 PM Carolyn Stare wrote:  Pt said the below med ahs bee nrecalled and is asking for a call back     valsartan (DIOVAN) 160 MG tablet

## 2017-10-23 NOTE — Telephone Encounter (Signed)
Left message on cell phone VM.

## 2017-10-24 NOTE — Telephone Encounter (Signed)
Phoned this morning to see if patient received message.  Wife says they did not receive the message and patient is not available at the time.  Wife will ask patient to call back.  CRM

## 2017-10-24 NOTE — Telephone Encounter (Signed)
Pt states he did not say the Valsartan was recalled.  He actually picked up the Rx for valsartan the other day.  Pt would like a call back because he is confused now at recommending 2 other meds.

## 2017-10-24 NOTE — Telephone Encounter (Signed)
Patient states he just picked up the prescription for Valsartan and apparently it was not recalled.

## 2017-10-25 DIAGNOSIS — M25512 Pain in left shoulder: Secondary | ICD-10-CM | POA: Diagnosis not present

## 2017-11-12 ENCOUNTER — Other Ambulatory Visit: Payer: Self-pay | Admitting: Internal Medicine

## 2018-01-11 DIAGNOSIS — L739 Follicular disorder, unspecified: Secondary | ICD-10-CM | POA: Diagnosis not present

## 2018-01-11 DIAGNOSIS — D1801 Hemangioma of skin and subcutaneous tissue: Secondary | ICD-10-CM | POA: Diagnosis not present

## 2018-01-11 DIAGNOSIS — D225 Melanocytic nevi of trunk: Secondary | ICD-10-CM | POA: Diagnosis not present

## 2018-01-11 DIAGNOSIS — D045 Carcinoma in situ of skin of trunk: Secondary | ICD-10-CM | POA: Diagnosis not present

## 2018-01-11 DIAGNOSIS — Z85828 Personal history of other malignant neoplasm of skin: Secondary | ICD-10-CM | POA: Diagnosis not present

## 2018-01-11 DIAGNOSIS — L814 Other melanin hyperpigmentation: Secondary | ICD-10-CM | POA: Diagnosis not present

## 2018-01-11 DIAGNOSIS — L570 Actinic keratosis: Secondary | ICD-10-CM | POA: Diagnosis not present

## 2018-01-11 DIAGNOSIS — D485 Neoplasm of uncertain behavior of skin: Secondary | ICD-10-CM | POA: Diagnosis not present

## 2018-01-11 DIAGNOSIS — L821 Other seborrheic keratosis: Secondary | ICD-10-CM | POA: Diagnosis not present

## 2018-02-21 DIAGNOSIS — R69 Illness, unspecified: Secondary | ICD-10-CM | POA: Diagnosis not present

## 2018-03-14 ENCOUNTER — Ambulatory Visit (INDEPENDENT_AMBULATORY_CARE_PROVIDER_SITE_OTHER): Payer: Medicare HMO | Admitting: Family Medicine

## 2018-03-14 ENCOUNTER — Encounter: Payer: Self-pay | Admitting: Family Medicine

## 2018-03-14 VITALS — BP 92/54 | HR 98 | Temp 98.3°F | Ht 67.0 in | Wt 180.2 lb

## 2018-03-14 DIAGNOSIS — M545 Low back pain, unspecified: Secondary | ICD-10-CM

## 2018-03-14 LAB — POCT URINALYSIS DIPSTICK
BILIRUBIN UA: POSITIVE
GLUCOSE UA: POSITIVE — AB
Ketones, UA: POSITIVE
LEUKOCYTES UA: NEGATIVE
Nitrite, UA: NEGATIVE
PH UA: 5 (ref 5.0–8.0)
Protein, UA: NEGATIVE
RBC UA: NEGATIVE
UROBILINOGEN UA: 0.2 U/dL

## 2018-03-14 NOTE — Patient Instructions (Signed)
Your urine did not show signs of a kidney stone.   I suspect your strained your back  Treatment: 1) Try heat or ice 2) try to move - and do gentle range of motion movement 3) Typically gets better   If your symptoms worse, you noticed blood in your urine, or are unable to urinate then return to clinic. At this point it does not seem consistent with a kidney stone.   Acute Back Pain, Adult Acute back pain is sudden and usually short-lived. It is often caused by an injury to the muscles and tissues in the back. The injury may result from:  A muscle or ligament getting overstretched or torn (strained). Ligaments are tissues that connect bones to each other. Lifting something improperly can cause a back strain.  Wear and tear (degeneration) of the spinal disks. Spinal disks are circular tissue that provides cushioning between the bones of the spine (vertebrae).  Twisting motions, such as while playing sports or doing yard work.  A hit to the back.  Arthritis. You may have a physical exam, lab tests, and imaging tests to find the cause of your pain. Acute back pain usually goes away with rest and home care. Follow these instructions at home: Managing pain, stiffness, and swelling  Take over-the-counter and prescription medicines only as told by your health care provider.  Your health care provider may recommend applying ice during the first 24-48 hours after your pain starts. To do this: ? Put ice in a plastic bag. ? Place a towel between your skin and the bag. ? Leave the ice on for 20 minutes, 2-3 times a day.  If directed, apply heat to the affected area as often as told by your health care provider. Use the heat source that your health care provider recommends, such as a moist heat pack or a heating pad. ? Place a towel between your skin and the heat source. ? Leave the heat on for 20-30 minutes. ? Remove the heat if your skin turns bright red. This is especially important if you  are unable to feel pain, heat, or cold. You have a greater risk of getting burned. Activity   Do not stay in bed. Staying in bed for more than 1-2 days can delay your recovery.  Sit up and stand up straight. Avoid leaning forward when you sit, or hunching over when you stand. ? If you work at a desk, sit close to it so you do not need to lean over. Keep your chin tucked in. Keep your neck drawn back, and keep your elbows bent at a right angle. Your arms should look like the letter "L." ? Sit high and close to the steering wheel when you drive. Add lower back (lumbar) support to your car seat, if needed.  Take short walks on even surfaces as soon as you are able. Try to increase the length of time you walk each day.  Do not sit, drive, or stand in one place for more than 30 minutes at a time. Sitting or standing for long periods of time can put stress on your back.  Do not drive or use heavy machinery while taking prescription pain medicine.  Use proper lifting techniques. When you bend and lift, use positions that put less stress on your back: ? [place] your knees. ? Keep the load close to your body. ? Avoid twisting.  Exercise regularly as told by your health care provider. Exercising helps your back heal faster and  helps prevent back injuries by keeping muscles strong and flexible.  Work with a physical therapist to make a safe exercise program, as recommended by your health care provider. Do any exercises as told by your physical therapist. Lifestyle  Maintain a healthy weight. Extra weight puts stress on your back and makes it difficult to have good posture.  Avoid activities or situations that make you feel anxious or stressed. Stress and anxiety increase muscle tension and can make back pain worse. Learn ways to manage anxiety and stress, such as through exercise. General instructions  Sleep on a firm mattress in a comfortable position. Try lying on your side with your knees  slightly bent. If you lie on your back, put a pillow under your knees.  Follow your treatment plan as told by your health care provider. This may include: ? Cognitive or behavioral therapy. ? Acupuncture or massage therapy. ? Meditation or yoga. Contact a health care provider if:  You have pain that is not relieved with rest or medicine.  You have increasing pain going down into your legs or buttocks.  Your pain does not improve after 2 weeks.  You have pain at night.  You lose weight without trying.  You have a fever or chills. Get help right away if:  You develop new bowel or bladder control problems.  You have unusual weakness or numbness in your arms or legs.  You develop nausea or vomiting.  You develop abdominal pain.  You feel faint. Summary  Acute back pain is sudden and usually short-lived.  Use proper lifting techniques. When you bend and lift, use positions that put less stress on your back.  Take over-the-counter and prescription medicines and apply heat or ice as directed by your health care provider. This information is not intended to replace advice given to you by your health care provider. Make sure you discuss any questions you have with your health care provider. Document Released: 12/27/2004 Document Revised: 08/03/2017 Document Reviewed: 08/10/2016 Elsevier Interactive Patient Education  2019 [employer].

## 2018-03-14 NOTE — Progress Notes (Signed)
Subjective:     Sean Koch is a 78 y.o. male presenting for Back Pain (x 1 week. Left lower/mid area of the back. States like a kidney stone pain. Had kidney stones about 5 months ago. Taking Tamsulosin and oxycodone-apap. No trouble iwht urination and no blood in the urine.)     Back Pain  This is a new problem. The current episode started in the past 7 days. The problem occurs 2 to 4 times per day. The problem has been waxing and waning since onset. The pain is present in the lumbar spine. The quality of the pain is described as stabbing (sharp pain). The pain does not radiate. The pain is at a severity of 9/10. The symptoms are aggravated by position and standing (moving leg in a certain position). Pertinent negatives include no abdominal pain, bladder incontinence, bowel incontinence, chest pain, fever, headaches, leg pain, numbness, pelvic pain, tingling or weakness. Risk factors include lack of exercise. He has tried bed rest and analgesics for the symptoms.     Review of Systems  Constitutional: Negative for fever.  Cardiovascular: Negative for chest pain.  Gastrointestinal: Negative for abdominal pain and bowel incontinence.  Genitourinary: Negative for bladder incontinence, hematuria and pelvic pain.  Musculoskeletal: Positive for back pain.  Neurological: Negative for tingling, weakness, numbness and headaches.     Social History   Tobacco Use  Smoking Status Never Smoker  Smokeless Tobacco Never Used        Objective:    BP Readings from Last 3 Encounters:  03/14/18 (!) 92/54  10/10/17 102/60  09/30/17 (!) 147/80   Wt Readings from Last 3 Encounters:  03/14/18 180 lb 4 oz (81.8 kg)  10/10/17 170 lb (77.1 kg)  09/29/17 178 lb (80.7 kg)    BP (!) 92/54   Pulse 98   Temp 98.3 F (36.8 C)   Ht 5\' 7"  (1.702 m)   Wt 180 lb 4 oz (81.8 kg)   BMI 28.23 kg/m    Physical Exam Constitutional:      Appearance: Normal appearance. He is not ill-appearing  or diaphoretic.     Comments: Moving slowly, ambulating with cane  HENT:     Right Ear: External ear normal.     Left Ear: External ear normal.     Nose: Nose normal.  Eyes:     General: No scleral icterus.    Extraocular Movements: Extraocular movements intact.     Conjunctiva/sclera: Conjunctivae normal.  Neck:     Musculoskeletal: Neck supple.  Cardiovascular:     Rate and Rhythm: Normal rate and regular rhythm.     Heart sounds: No murmur.  Pulmonary:     Effort: Pulmonary effort is normal. No respiratory distress.     Breath sounds: Normal breath sounds. No wheezing.  Abdominal:     General: Abdomen is flat. Bowel sounds are normal. There is no distension.     Tenderness: There is no abdominal tenderness. There is no right CVA tenderness, left CVA tenderness, guarding or rebound.  Musculoskeletal:     Comments: Back:  Inspection: no abnormalities Palpation: no spinous process or paraspinous TTP. No TTP over area of pain - which is the left lumbar area ROM: decreased extension 2/2 to pain. Normal flexion Special tests - negative straight leg raise, though raising the contralateral leg results in worsening pain  Skin:    General: Skin is warm and dry.  Neurological:     Mental Status: He is alert. Mental  status is at baseline.  Psychiatric:        Mood and Affect: Mood normal.      CT Abd/Pelvis 09/29/2017 IMPRESSION: 1. A 4 mm distal right ureteral calculus with mild right hydronephrosis. Additional nonobstructing bilateral renal calculi Noted.  UA: + glucose, increased specific gravity, no blood     Assessment & Plan:   Problem List Items Addressed This Visit    None    Visit Diagnoses    Acute left-sided low back pain without sciatica    -  Primary   Relevant Orders   POCT urinalysis dipstick (Completed)     Consider kidney stone as patient was concerned, however, no blood in urine and pain localized to lower back w/o abdominal pain. Does not seem as  colicky as typical for a kidney stone and more aggravated by position and muscle movement. Suspect low back strain.   Return precautions discussed. Advised heat/ice and movement to help heal from strain. If worsening or no improvement could consider either [place] or CT to evaluate for kidney stone.   Return if symptoms worsen or fail to improve.  Lesleigh Noe, MD

## 2018-03-16 ENCOUNTER — Other Ambulatory Visit: Payer: Self-pay | Admitting: Internal Medicine

## 2018-03-16 NOTE — Telephone Encounter (Signed)
Name of Medication:oxycodone apap 5-325  Name of Pharmacy: [facility] Last Fill or Written Date and Quantity:# 20 on 10/06/17  Last Office Visit and Type:03/14/18 for back pain  Next Office Visit and Type:03/21/18 6 mth FU  Last Controlled Substance Agreement Date: none Last QKM:MNOT  Pt states has tried OTC pain med with no relief. Pt request enough oxycodone apap until can see Dr Silvio Pate on 03/21/18. Dr Silvio Pate and Dr Einar Pheasant is out of office.Please advise.

## 2018-03-16 NOTE — Telephone Encounter (Signed)
Name of Medication: Oxycodone Name of Pharmacy: [facility] or Written Date and Quantity: 10-06-17 #20 for kidney stone pain Last Office Visit and Type: Acute Back Pain Dr Einar Pheasant 03-14-18 Next Office Visit and Type: 03-21-18 Last Controlled Substance Agreement Date: none Last UDS: none

## 2018-03-21 ENCOUNTER — Encounter: Payer: Self-pay | Admitting: Internal Medicine

## 2018-03-21 ENCOUNTER — Ambulatory Visit (INDEPENDENT_AMBULATORY_CARE_PROVIDER_SITE_OTHER): Payer: Medicare HMO | Admitting: Internal Medicine

## 2018-03-21 ENCOUNTER — Other Ambulatory Visit: Payer: Self-pay

## 2018-03-21 VITALS — BP 100/64 | HR 79 | Temp 98.3°F | Ht 67.0 in | Wt 176.0 lb

## 2018-03-21 DIAGNOSIS — IMO0002 Reserved for concepts with insufficient information to code with codable children: Secondary | ICD-10-CM

## 2018-03-21 DIAGNOSIS — I700 Atherosclerosis of aorta: Secondary | ICD-10-CM | POA: Diagnosis not present

## 2018-03-21 DIAGNOSIS — Z87442 Personal history of urinary calculi: Secondary | ICD-10-CM

## 2018-03-21 DIAGNOSIS — N183 Chronic kidney disease, stage 3 unspecified: Secondary | ICD-10-CM

## 2018-03-21 DIAGNOSIS — E1149 Type 2 diabetes mellitus with other diabetic neurological complication: Secondary | ICD-10-CM

## 2018-03-21 DIAGNOSIS — E1165 Type 2 diabetes mellitus with hyperglycemia: Secondary | ICD-10-CM

## 2018-03-21 LAB — POCT GLYCOSYLATED HEMOGLOBIN (HGB A1C): Hemoglobin A1C: 7.5 % — AB (ref 4.0–5.6)

## 2018-03-21 MED ORDER — SITAGLIPTIN PHOSPHATE 100 MG PO TABS: 100.0000 mg | ORAL_TABLET | Freq: Every day | ORAL | 0 refills | Status: DC

## 2018-03-21 MED ORDER — LEVOTHYROXINE SODIUM 75 MCG PO TABS: 75.0000 ug | ORAL_TABLET | Freq: Every day | ORAL | 3 refills | Status: DC

## 2018-03-21 MED ORDER — LOSARTAN POTASSIUM 100 MG PO TABS: 100.0000 mg | ORAL_TABLET | Freq: Every day | ORAL | 3 refills | Status: DC

## 2018-03-21 MED ORDER — OMEPRAZOLE 20 MG PO CPDR: 20.0000 mg | DELAYED_RELEASE_CAPSULE | Freq: Every day | ORAL | 3 refills | Status: DC

## 2018-03-21 MED ORDER — SIMVASTATIN 80 MG PO TABS: 80.0000 mg | ORAL_TABLET | Freq: Every day | ORAL | 3 refills | Status: DC

## 2018-03-21 MED ORDER — METFORMIN HCL 500 MG PO TABS: ORAL_TABLET | ORAL | 3 refills | Status: DC

## 2018-03-21 MED ORDER — GLIPIZIDE 10 MG PO TABS: ORAL_TABLET | ORAL | 3 refills | Status: DC

## 2018-03-21 NOTE — Assessment & Plan Note (Signed)
Had some left kidney type pain---better now Would check CT if recurs

## 2018-03-21 NOTE — Assessment & Plan Note (Signed)
Is on ASA and statin BP fine

## 2018-03-21 NOTE — Assessment & Plan Note (Signed)
Mild Is on ARB 

## 2018-03-21 NOTE — Assessment & Plan Note (Addendum)
Hasn't been checking---will do A1c here Needs new glucometer Mild neuropathy only  Lab Results  Component Value Date   HGBA1C 7.5 (A) 03/21/2018   Still with good control

## 2018-03-21 NOTE — Progress Notes (Signed)
Subjective:    Patient ID: Sean Koch, male    DOB: August 28, 1940, 78 y.o.   MRN: [id number]  HPI Here for follow up of diabetes and other chronic health condtions  Still with some back pain---pressure Did feel something like his last kidney stone Took some oxycodone Has improved No urinary stream problems  Reviewed CT scan Does have atherosclerosis---is on ASA and statin  Known CKD 3 Last GFR 51  Hasn't been checking sugars No hypoglycemia Same tingling and foot pain--nothing worrisome  No chest pain No SOB Some brief dizziness ---when quickly getting OOB. Resolves quickly  Current Outpatient Medications on File Prior to Visit  Medication Sig Dispense Refill  . aspirin EC 81 MG tablet Take 81 mg by mouth daily.    . Blood Glucose Monitoring Suppl (ONE TOUCH ULTRA SYSTEM KIT) w/Device KIT Use to test blood sugar once daily DX: E11.41 1 each 0  . Calcium Carb-Cholecalciferol 600-800 MG-UNIT TABS Take 2 tablets by mouth daily.     Marland Kitchen glucose blood (ONE TOUCH ULTRA TEST) test strip Use as instructed, to test 2-3 times weekly dx: E11.49 100 each 3  . ONE TOUCH LANCETS MISC Use to test blood sugar 2-3 times weekly dx: E11.49 100 each 3  . oxyCODONE-acetaminophen (PERCOCET/ROXICET) 5-325 MG tablet TAKE 1-2 TABLETS BY MOUTH EVERY 4 HOURS AS NEEDED FOR MODERATE PAIN OR SEVERE PAIN. 20 tablet 0   No current facility-administered medications on file prior to visit.     Allergies  Allergen Reactions  . Atorvastatin     REACTION: Joint aches    Past Medical History:  Diagnosis Date  . Carpal tunnel syndrome of right wrist   . Cataract   . ED (erectile dysfunction)   . GERD (gastroesophageal reflux disease)   . History of nephrolithiasis   . Hyperlipidemia   . Melanoma ([place])   . NIDDM, uncontrolled, with neuropathy   . Personal history of colonic polyps - 2 small adenomas 07/15/2013  . Renal disorder    kidney stones  . Seronegative rheumatoid arthritis ([place])   .  Thyroid disease     Past Surgical History:  Procedure Laterality Date  . CATARACT EXTRACTION W/ INTRAOCULAR LENS IMPLANT Bilateral 11/14 & 2/15  . CHOLECYSTECTOMY    . KNEE ARTHROSCOPY    . MELANOMA EXCISION     upper right arm  . PILONIDAL CYST / SINUS EXCISION    . ROTATOR CUFF REPAIR    . TOE SURGERY      Family History  Problem Relation Age of Onset  . Heart disease Mother   . Diabetes Mother   . Heart disease Brother   . Cancer Neg Hx   . Colon cancer Neg Hx     Social History   Socioeconomic History  . Marital status: Married    Spouse name: Not on file  . Number of children: 2  . Years of education: Not on file  . Highest education level: Not on file  Occupational History  . Occupation: retired IT trainer  . Financial resource strain: Not on file  . Food insecurity:    Worry: Not on file    Inability: Not on file  . Transportation needs:    Medical: Not on file    Non-medical: Not on file  Tobacco Use  . Smoking status: Never Smoker  . Smokeless tobacco: Never Used  Substance and Sexual Activity  . Alcohol use: Yes    Comment: 1 glass wine monthly  per pt.  . Drug use: No  . Sexual activity: Not on file  Lifestyle  . Physical activity:    Days per week: Not on file    Minutes per session: Not on file  . Stress: Not on file  Relationships  . Social connections:    Talks on phone: Not on file    Gets together: Not on file    Attends religious service: Not on file    Active member of club or organization: Not on file    Attends meetings of clubs or organizations: Not on file    Relationship status: Not on file  . Intimate partner violence:    Fear of current or ex partner: Not on file    Emotionally abused: Not on file    Physically abused: Not on file    Forced sexual activity: Not on file  Other Topics Concern  . Not on file  Social History Narrative   Has living will    Wife, then son Osmani, would be health care POA   Would  accept resuscitation. No prolonged artificial ventilation.   Not sure about tube feeds   Review of Systems Appetite fine Weight fairly stable Sleeps fine Bowels are fine    Objective:   Physical Exam  Constitutional: He appears well-developed. No distress.  Neck: No thyromegaly present.  Cardiovascular: Normal rate, regular rhythm, normal heart sounds and intact distal pulses. Exam reveals no gallop.  No murmur heard. Faint pedal pulses  Respiratory: Effort normal and breath sounds normal. No respiratory distress. He has no wheezes. He has no rales.  GI: Soft. There is no abdominal tenderness.  Musculoskeletal:        General: No edema.     Comments: No CVA tenderness (but left CVA was where his pain was)  Lymphadenopathy:    He has no cervical adenopathy.  Skin:  No foot lesions  Psychiatric: He has a normal mood and affect. His behavior is normal.           Assessment & Plan:

## 2018-04-03 ENCOUNTER — Other Ambulatory Visit: Payer: Self-pay | Admitting: Internal Medicine

## 2018-08-23 DIAGNOSIS — R69 Illness, unspecified: Secondary | ICD-10-CM | POA: Diagnosis not present

## 2018-09-28 ENCOUNTER — Other Ambulatory Visit: Payer: Self-pay

## 2018-09-28 ENCOUNTER — Ambulatory Visit (INDEPENDENT_AMBULATORY_CARE_PROVIDER_SITE_OTHER): Payer: Medicare HMO | Admitting: Internal Medicine

## 2018-09-28 ENCOUNTER — Encounter: Payer: Self-pay | Admitting: Internal Medicine

## 2018-09-28 VITALS — BP 102/66 | HR 70 | Temp 98.7°F | Ht 67.5 in | Wt 177.0 lb

## 2018-09-28 DIAGNOSIS — E039 Hypothyroidism, unspecified: Secondary | ICD-10-CM

## 2018-09-28 DIAGNOSIS — E1149 Type 2 diabetes mellitus with other diabetic neurological complication: Secondary | ICD-10-CM

## 2018-09-28 DIAGNOSIS — I10 Essential (primary) hypertension: Secondary | ICD-10-CM

## 2018-09-28 DIAGNOSIS — Z0000 Encounter for general adult medical examination without abnormal findings: Secondary | ICD-10-CM | POA: Diagnosis not present

## 2018-09-28 DIAGNOSIS — E1165 Type 2 diabetes mellitus with hyperglycemia: Secondary | ICD-10-CM

## 2018-09-28 DIAGNOSIS — Z23 Encounter for immunization: Secondary | ICD-10-CM

## 2018-09-28 DIAGNOSIS — Z7189 Other specified counseling: Secondary | ICD-10-CM

## 2018-09-28 DIAGNOSIS — IMO0002 Reserved for concepts with insufficient information to code with codable children: Secondary | ICD-10-CM

## 2018-09-28 LAB — LIPID PANEL
Cholesterol: 121 mg/dL (ref 0–200)
HDL: 32.8 mg/dL — ABNORMAL LOW (ref >39.00)
LDL Cholesterol: 53 mg/dL (ref 0–99)
NonHDL: 88.69
Total CHOL/HDL Ratio: 4
Triglycerides: 180 mg/dL — ABNORMAL HIGH (ref 0.0–149.0)
VLDL: 36 mg/dL (ref 0.0–40.0)

## 2018-09-28 LAB — HM DIABETES FOOT EXAM

## 2018-09-28 LAB — RENAL FUNCTION PANEL
Albumin: 4.2 g/dL (ref 3.5–5.2)
BUN: 16 mg/dL (ref 6–23)
CO2: 28 mEq/L (ref 19–32)
Calcium: 9.6 mg/dL (ref 8.4–10.5)
Chloride: 103 mEq/L (ref 96–112)
Creatinine, Ser: 1.28 mg/dL (ref 0.40–1.50)
GFR: 54.01 mL/min — ABNORMAL LOW (ref >60.00)
Glucose, Bld: 254 mg/dL — ABNORMAL HIGH (ref 70–99)
Phosphorus: 3 mg/dL (ref 2.3–4.6)
Potassium: 4.8 mEq/L (ref 3.5–5.1)
Sodium: 140 mEq/L (ref 135–145)

## 2018-09-28 LAB — CBC
HCT: 44.1 % (ref 39.0–52.0)
Hemoglobin: 14.5 g/dL (ref 13.0–17.0)
MCHC: 32.8 g/dL (ref 30.0–36.0)
MCV: 93.8 fl (ref 78.0–100.0)
Platelets: 210 10*3/uL (ref 150.0–400.0)
RBC: 4.69 Mil/uL (ref 4.22–5.81)
RDW: 14.1 % (ref 11.5–15.5)
WBC: 9 10*3/uL (ref 4.0–10.5)

## 2018-09-28 LAB — HEPATIC FUNCTION PANEL
ALT: 10 U/L (ref 0–53)
AST: 15 U/L (ref 0–37)
Albumin: 4.2 g/dL (ref 3.5–5.2)
Alkaline Phosphatase: 61 U/L (ref 39–117)
Bilirubin, Direct: 0.1 mg/dL (ref 0.0–0.3)
Total Bilirubin: 0.7 mg/dL (ref 0.2–1.2)
Total Protein: 6.5 g/dL (ref 6.0–8.3)

## 2018-09-28 LAB — T4, FREE: Free T4: 0.95 ng/dL (ref 0.60–1.60)

## 2018-09-28 LAB — HEMOGLOBIN A1C: Hgb A1c MFr Bld: 7.3 % — ABNORMAL HIGH (ref 4.6–6.5)

## 2018-09-28 LAB — TSH: TSH: 3.16 u[IU]/mL (ref 0.35–4.50)

## 2018-09-28 MED ORDER — LOSARTAN POTASSIUM 50 MG PO TABS: 50.0000 mg | ORAL_TABLET | Freq: Every day | ORAL | 0 refills | Status: DC

## 2018-09-28 NOTE — Assessment & Plan Note (Signed)
Due for labs

## 2018-09-28 NOTE — Assessment & Plan Note (Signed)
See social history 

## 2018-09-28 NOTE — Progress Notes (Signed)
Hearing Screening   Method: Audiometry   125Hz  250Hz  500Hz  1000Hz  2000Hz  3000Hz  4000Hz  6000Hz  8000Hz   Right ear:   20 20 20   0    Left ear:   20 20 20   0    Vision Screening Comments: Appt January 2021

## 2018-09-28 NOTE — Progress Notes (Signed)
Subjective:    Patient ID: Sean Koch, male    DOB: 06/09/1940, 78 y.o.   MRN: [id number]  HPI Here for Medicare Wellness visit and follow up of chronic health conditions Reviewed form and advanced directives Reviewed other doctors No alcohol or tobacco Still not exercising--discussed Did have a fall last week---minor shoulder injury (better now)--tripped over vacuum cord Vision and hearing are fine No depression but some anhedonia related to COVID (trouble staying home) Independent with instrumental ADLs No worrisome memory issues  Has had some dizziness upon standing--has to hold on briefly Has to sit on the edge of the bed No syncope No foot numbness or tingling of note  Not checking sugars Known CKD 3--- GFR in 50's No low sugar reactions  Known aortic atherosclerosis On statin No chest pain  DOE ---like 30 minutes of weed whacking No edema No palpitations  Current Outpatient Medications on File Prior to Visit  Medication Sig Dispense Refill  . aspirin EC 81 MG tablet Take 81 mg by mouth daily.    . Blood Glucose Monitoring Suppl (ONE TOUCH ULTRA SYSTEM KIT) w/Device KIT Use to test blood sugar once daily DX: E11.41 1 each 0  . Calcium Carb-Cholecalciferol 600-800 MG-UNIT TABS Take 2 tablets by mouth daily.     Marland Kitchen glipiZIDE (GLUCOTROL) 10 MG tablet TAKE 1 TABLET BY MOUTH  TWICE A DAY BEFORE MEALS 180 tablet 3  . glucose blood (ONE TOUCH ULTRA TEST) test strip Use as instructed, to test 2-3 times weekly dx: E11.49 100 each 3  . JANUVIA 100 MG tablet TAKE 1 TABLET DAILY 90 tablet 3  . levothyroxine (SYNTHROID, LEVOTHROID) 75 MCG tablet Take 1 tablet (75 mcg total) by mouth daily. 90 tablet 3  . metFORMIN (GLUCOPHAGE) 500 MG tablet TAKE 2 TABLETS BY MOUTH TWO TIMES DAILY WITH MEALS 360 tablet 3  . omeprazole (PRILOSEC) 20 MG capsule Take 1 capsule (20 mg total) by mouth daily. 90 capsule 3  . ONE TOUCH LANCETS MISC Use to test blood sugar 2-3 times weekly dx:  E11.49 100 each 3  . simvastatin (ZOCOR) 80 MG tablet Take 1 tablet (80 mg total) by mouth at bedtime. 90 tablet 3  . [DISCONTINUED] losartan (COZAAR) 100 MG tablet Take 1 tablet (100 mg total) by mouth daily. 90 tablet 3   No current facility-administered medications on file prior to visit.     Allergies  Allergen Reactions  . Atorvastatin     REACTION: Joint aches    Past Medical History:  Diagnosis Date  . Carpal tunnel syndrome of right wrist   . Cataract   . ED (erectile dysfunction)   . GERD (gastroesophageal reflux disease)   . History of nephrolithiasis   . Hyperlipidemia   . Melanoma ([place])   . NIDDM, uncontrolled, with neuropathy   . Personal history of colonic polyps - 2 small adenomas 07/15/2013  . Renal disorder    kidney stones  . Seronegative rheumatoid arthritis ([place])   . Thyroid disease     Past Surgical History:  Procedure Laterality Date  . CATARACT EXTRACTION W/ INTRAOCULAR LENS IMPLANT Bilateral 11/14 & 2/15  . CHOLECYSTECTOMY    . KNEE ARTHROSCOPY    . MELANOMA EXCISION     upper right arm  . PILONIDAL CYST / SINUS EXCISION    . ROTATOR CUFF REPAIR    . TOE SURGERY      Family History  Problem Relation Age of Onset  . Heart disease Mother   .  Diabetes Mother   . Heart disease Brother   . Cancer Neg Hx   . Colon cancer Neg Hx     Social History   Socioeconomic History  . Marital status: Married    Spouse name: Not on file  . Number of children: 2  . Years of education: Not on file  . Highest education level: Not on file  Occupational History  . Occupation: retired IT trainer  . Financial resource strain: Not on file  . Food insecurity    Worry: Not on file    Inability: Not on file  . Transportation needs    Medical: Not on file    Non-medical: Not on file  Tobacco Use  . Smoking status: Never Smoker  . Smokeless tobacco: Never Used  Substance and Sexual Activity  . Alcohol use: Yes    Comment: 1 glass wine  monthly per pt.  . Drug use: No  . Sexual activity: Not on file  Lifestyle  . Physical activity    Days per week: Not on file    Minutes per session: Not on file  . Stress: Not on file  Relationships  . Social Herbalist on phone: Not on file    Gets together: Not on file    Attends religious service: Not on file    Active member of club or organization: Not on file    Attends meetings of clubs or organizations: Not on file    Relationship status: Not on file  . Intimate partner violence    Fear of current or ex partner: Not on file    Emotionally abused: Not on file    Physically abused: Not on file    Forced sexual activity: Not on file  Other Topics Concern  . Not on file  Social History Narrative   Has living will    Wife, then son Shep, would be health care POA   Would accept resuscitation. No prolonged artificial ventilation.   Not sure about tube feeds   Review of Systems Appetite is okay--less than in the past Weight stable Sleeps well Wears seat belt Some tooth issues--keeps up with dentist No skin rash or suspicious lesions. Some dry skin No heartburn or dysphagia Bowels are loose at times---no blood No sig back or joint pain    Objective:   Physical Exam  Constitutional: He is oriented to person, place, and time. He appears well-developed. No distress.  HENT:  Mouth/Throat: Oropharynx is clear and moist. No oropharyngeal exudate.  Neck: No thyromegaly present.  Cardiovascular: Normal rate, regular rhythm, normal heart sounds and intact distal pulses. Exam reveals no gallop.  No murmur heard. Respiratory: Effort normal and breath sounds normal. No respiratory distress. He has no wheezes. He has no rales.  GI: Soft. There is no abdominal tenderness.  Musculoskeletal:        General: No tenderness or edema.  Neurological: He is alert and oriented to person, place, and time.  President--- "Megan Salon, Obama, Bush" [phone]  D-l-r-[id number]-w Recall 2/3  Fairly normal sensation in feet  Skin: No rash noted.  No foot lesions  Psychiatric: He has a normal mood and affect. His behavior is normal.           Assessment & Plan:

## 2018-09-28 NOTE — Assessment & Plan Note (Signed)
Hopefully still acceptable control Neuropathy seems better

## 2018-09-28 NOTE — Assessment & Plan Note (Signed)
I have personally reviewed the Medicare Annual Wellness questionnaire and have noted 1. The patient's medical and social history 2. Their use of alcohol, tobacco or illicit drugs 3. Their current medications and supplements 4. The patient's functional ability including ADL's, fall risks, home safety risks and hearing or visual             impairment. 5. Diet and physical activities 6. Evidence for depression or mood disorders  The patients weight, height, BMI and visual acuity have been recorded in the chart I have made referrals, counseling and provided education to the patient based review of the above and I have provided the pt with a written personalized care plan for preventive services.  I have provided you with a copy of your personalized plan for preventive services. Please take the time to review along with your updated medication list.  Flu vaccine today Done with cancer screeening Consider shingrix at pharmacy Discussed fitness

## 2018-09-28 NOTE — Patient Instructions (Signed)
Please decrease the losartan to 50mg  a day. You can cut the pills you have in half (this is to hopefully get rid of the dizziness when you stand up)

## 2018-09-28 NOTE — Assessment & Plan Note (Signed)
BP Readings from Last 3 Encounters:  09/28/18 102/66  03/21/18 100/64  03/14/18 (!) 92/54   Some orthostatic symptoms Will cut the losartan dose

## 2018-11-27 DIAGNOSIS — E119 Type 2 diabetes mellitus without complications: Secondary | ICD-10-CM | POA: Diagnosis not present

## 2018-11-27 DIAGNOSIS — Z9841 Cataract extraction status, right eye: Secondary | ICD-10-CM | POA: Diagnosis not present

## 2018-11-27 DIAGNOSIS — H52223 Regular astigmatism, bilateral: Secondary | ICD-10-CM | POA: Diagnosis not present

## 2018-11-27 DIAGNOSIS — Z9842 Cataract extraction status, left eye: Secondary | ICD-10-CM | POA: Diagnosis not present

## 2018-11-27 LAB — HM DIABETES EYE EXAM

## 2018-12-21 ENCOUNTER — Other Ambulatory Visit: Payer: Self-pay | Admitting: Orthopedic Surgery

## 2018-12-21 DIAGNOSIS — M542 Cervicalgia: Secondary | ICD-10-CM

## 2019-01-29 ENCOUNTER — Ambulatory Visit: Payer: Medicare HMO | Attending: Internal Medicine

## 2019-01-29 DIAGNOSIS — Z23 Encounter for immunization: Secondary | ICD-10-CM

## 2019-01-29 NOTE — Progress Notes (Signed)
   Covid-19 Vaccination Clinic  Name:  Sean Koch    MRN: [id number] DOB: 31-Mar-1940  01/29/2019  Mr. [name] was observed post Covid-19 immunization for 15 minutes without incidence. He was provided with Vaccine Information Sheet and instruction to access the V-Safe system.   Mr. [name] was instructed to call 911 with any severe reactions post vaccine: Marland Kitchen Difficulty breathing  . Swelling of your face and throat  . A fast heartbeat  . A bad rash all over your body  . Dizziness and weakness    Immunizations Administered    Name Date Dose VIS Date Route   Pfizer COVID-19 Vaccine 01/29/2019  9:59 AM 0.3 mL 12/21/2018 Intramuscular   Manufacturer: [employer], [employer]   Lot: [id number]   [place]: [id number]

## 2019-02-19 ENCOUNTER — Ambulatory Visit: Payer: Medicare HMO | Attending: Internal Medicine

## 2019-02-19 DIAGNOSIS — Z23 Encounter for immunization: Secondary | ICD-10-CM | POA: Insufficient documentation

## 2019-02-19 NOTE — Progress Notes (Signed)
   Covid-19 Vaccination Clinic  Name:  Ioannis Sarti    MRN: [id number] DOB: 02-06-1940  02/19/2019  Mr. Phaneuf was observed post Covid-19 immunization for 15 minutes without incidence. He was provided with Vaccine Information Sheet and instruction to access the V-Safe system.   Mr. Ybarbo was instructed to call 911 with any severe reactions post vaccine: Marland Kitchen Difficulty breathing  . Swelling of your face and throat  . A fast heartbeat  . A bad rash all over your body  . Dizziness and weakness    Immunizations Administered    Name Date Dose VIS Date Route   Pfizer COVID-19 Vaccine 02/19/2019  9:46 AM 0.3 mL 12/21/2018 Intramuscular   Manufacturer: [place]   Lot: [id number]   [place]: [id number]

## 2019-03-19 ENCOUNTER — Ambulatory Visit (INDEPENDENT_AMBULATORY_CARE_PROVIDER_SITE_OTHER): Payer: Medicare HMO | Admitting: Internal Medicine

## 2019-03-19 ENCOUNTER — Encounter: Payer: Self-pay | Admitting: Internal Medicine

## 2019-03-19 ENCOUNTER — Other Ambulatory Visit: Payer: Self-pay

## 2019-03-19 VITALS — BP 102/60 | HR 78 | Temp 97.9°F | Ht 68.0 in | Wt 180.0 lb

## 2019-03-19 DIAGNOSIS — M25561 Pain in right knee: Secondary | ICD-10-CM | POA: Diagnosis not present

## 2019-03-19 DIAGNOSIS — M25562 Pain in left knee: Secondary | ICD-10-CM | POA: Diagnosis not present

## 2019-03-19 DIAGNOSIS — I10 Essential (primary) hypertension: Secondary | ICD-10-CM

## 2019-03-19 DIAGNOSIS — E1165 Type 2 diabetes mellitus with hyperglycemia: Secondary | ICD-10-CM | POA: Diagnosis not present

## 2019-03-19 DIAGNOSIS — IMO0002 Reserved for concepts with insufficient information to code with codable children: Secondary | ICD-10-CM

## 2019-03-19 DIAGNOSIS — E1149 Type 2 diabetes mellitus with other diabetic neurological complication: Secondary | ICD-10-CM

## 2019-03-19 LAB — POCT GLYCOSYLATED HEMOGLOBIN (HGB A1C): Hemoglobin A1C: 7.6 % — AB (ref 4.0–5.6)

## 2019-03-19 MED ORDER — SITAGLIPTIN PHOSPHATE 100 MG PO TABS: 100.0000 mg | ORAL_TABLET | Freq: Every day | ORAL | 3 refills | Status: DC

## 2019-03-19 MED ORDER — METFORMIN HCL 500 MG PO TABS: ORAL_TABLET | ORAL | 3 refills | Status: DC

## 2019-03-19 MED ORDER — GLIPIZIDE 10 MG PO TABS: ORAL_TABLET | ORAL | 3 refills | Status: DC

## 2019-03-19 MED ORDER — OMEPRAZOLE 20 MG PO CPDR: 20.0000 mg | DELAYED_RELEASE_CAPSULE | Freq: Every day | ORAL | 3 refills | Status: DC

## 2019-03-19 MED ORDER — LOSARTAN POTASSIUM 50 MG PO TABS: 50.0000 mg | ORAL_TABLET | Freq: Every day | ORAL | 3 refills | Status: DC

## 2019-03-19 MED ORDER — LEVOTHYROXINE SODIUM 75 MCG PO TABS: 75.0000 ug | ORAL_TABLET | Freq: Every day | ORAL | 3 refills | Status: DC

## 2019-03-19 MED ORDER — SIMVASTATIN 80 MG PO TABS: 80.0000 mg | ORAL_TABLET | Freq: Every day | ORAL | 3 refills | Status: DC

## 2019-03-19 NOTE — Assessment & Plan Note (Signed)
BP Readings from Last 3 Encounters:  03/19/19 102/60  09/28/18 102/66  03/21/18 100/64   No orthostatic symptoms on lower losartan No changes for now

## 2019-03-19 NOTE — Assessment & Plan Note (Signed)
Lab Results  Component Value Date   HGBA1C 7.6 (A) 03/19/2019   Diabetes control slipped some but still acceptable Discussed lifestyle Asked him to check at least monthly Neuropathy is mild only

## 2019-03-19 NOTE — Progress Notes (Signed)
Subjective:    Patient ID: Sean Koch, male    DOB: [birth year], 79 y.o.   MRN: [id number]  HPI Here for follow up of diabetes and other chronic health conditions This visit occurred during the SARS-CoV-2 public health emergency.  Safety protocols were in place, including screening questions prior to the visit, additional usage of staff PPE, and extensive cleaning of exam room while observing appropriate contact time as indicated for disinfecting solutions.   Did have COVID vaccine  Checks sugars infrequently---less than once a month Only checks if he doesn't feel good Feet get cold--and some pain at night. Very mild symptoms  Overall feels great--other than his knees Ready to see orthopedist  Is on lower losartan dose No longer having orthostatic symptoms  Current Outpatient Medications on File Prior to Visit  Medication Sig Dispense Refill  . aspirin EC 81 MG tablet Take 81 mg by mouth daily.    . Blood Glucose Monitoring Suppl (ONE TOUCH ULTRA SYSTEM KIT) w/Device KIT Use to test blood sugar once daily DX: E11.41 1 each 0  . Calcium Carb-Cholecalciferol 600-800 MG-UNIT TABS Take 2 tablets by mouth daily.     Marland Kitchen glipiZIDE (GLUCOTROL) 10 MG tablet TAKE 1 TABLET BY MOUTH  TWICE A DAY BEFORE MEALS 180 tablet 3  . glucose blood (ONE TOUCH ULTRA TEST) test strip Use as instructed, to test 2-3 times weekly dx: E11.49 100 each 3  . JANUVIA 100 MG tablet TAKE 1 TABLET DAILY 90 tablet 3  . levothyroxine (SYNTHROID, LEVOTHROID) 75 MCG tablet Take 1 tablet (75 mcg total) by mouth daily. 90 tablet 3  . losartan (COZAAR) 50 MG tablet Take 1 tablet (50 mg total) by mouth daily. 1 tablet 0  . metFORMIN (GLUCOPHAGE) 500 MG tablet TAKE 2 TABLETS BY MOUTH TWO TIMES DAILY WITH MEALS 360 tablet 3  . omeprazole (PRILOSEC) 20 MG capsule Take 1 capsule (20 mg total) by mouth daily. 90 capsule 3  . ONE TOUCH LANCETS MISC Use to test blood sugar 2-3 times weekly dx: E11.49 100 each 3  . simvastatin  (ZOCOR) 80 MG tablet Take 1 tablet (80 mg total) by mouth at bedtime. 90 tablet 3   No current facility-administered medications on file prior to visit.    Allergies  Allergen Reactions  . Atorvastatin     REACTION: Joint aches    Past Medical History:  Diagnosis Date  . Carpal tunnel syndrome of right wrist   . Cataract   . ED (erectile dysfunction)   . GERD (gastroesophageal reflux disease)   . History of nephrolithiasis   . Hyperlipidemia   . Melanoma ([place])   . NIDDM, uncontrolled, with neuropathy   . Personal history of colonic polyps - 2 small adenomas 07/15/2013  . Renal disorder    kidney stones  . Seronegative rheumatoid arthritis ([place])   . Thyroid disease     Past Surgical History:  Procedure Laterality Date  . CATARACT EXTRACTION W/ INTRAOCULAR LENS IMPLANT Bilateral 11/14 & 2/15  . CHOLECYSTECTOMY    . KNEE ARTHROSCOPY    . MELANOMA EXCISION     upper right arm  . PILONIDAL CYST / SINUS EXCISION    . ROTATOR CUFF REPAIR    . TOE SURGERY      Family History  Problem Relation Age of Onset  . Heart disease Mother   . Diabetes Mother   . Heart disease Brother   . Cancer Neg Hx   . Colon cancer Neg Hx  Social History   Socioeconomic History  . Marital status: Married    Spouse name: Not on file  . Number of children: 2  . Years of education: Not on file  . Highest education level: Not on file  Occupational History  . Occupation: retired Geneticist, molecular  Tobacco Use  . Smoking status: Never Smoker  . Smokeless tobacco: Never Used  Substance and Sexual Activity  . Alcohol use: Yes    Comment: 1 glass wine monthly per pt.  . Drug use: No  . Sexual activity: Not on file  Other Topics Concern  . Not on file  Social History Narrative   Has living will    Wife, then son Uchechukwu, would be health care POA   Would accept resuscitation. No prolonged artificial ventilation.   Not sure about tube feeds   Social Determinants of Health   Financial  Resource Strain:   . Difficulty of Paying Living Expenses: Not on file  Food Insecurity:   . Worried About Charity fundraiser in the Last Year: Not on file  . Ran Out of Food in the Last Year: Not on file  Transportation Needs:   . Lack of Transportation (Medical): Not on file  . Lack of Transportation (Non-Medical): Not on file  Physical Activity:   . Days of Exercise per Week: Not on file  . Minutes of Exercise per Session: Not on file  Stress:   . Feeling of Stress : Not on file  Social Connections:   . Frequency of Communication with Friends and Family: Not on file  . Frequency of Social Gatherings with Friends and Family: Not on file  . Attends Religious Services: Not on file  . Active Member of Clubs or Organizations: Not on file  . Attends Archivist Meetings: Not on file  . Marital Status: Not on file  Intimate Partner Violence:   . Fear of Current or Ex-Partner: Not on file  . Emotionally Abused: Not on file  . Physically Abused: Not on file  . Sexually Abused: Not on file   Review of Systems Weight up slightly Trouble initiating sleep---sleeps well once he falls asleep    Objective:   Physical Exam  Constitutional: No distress.  Neck: No thyromegaly present.  Cardiovascular: Normal rate, regular rhythm, normal heart sounds and intact distal pulses. Exam reveals no gallop.  No murmur heard. Respiratory: Effort normal and breath sounds normal. No respiratory distress. He has no wheezes. He has no rales.  Musculoskeletal:        General: No edema.     Comments: No knee swelling Fairly normal ROM Only mild crepitus No meniscus or ligament findings           Assessment & Plan:

## 2019-03-19 NOTE — Assessment & Plan Note (Signed)
Seems mostly likely to be mild arthritis It is limiting his exercise/walking Will set up ortho evaluation

## 2019-03-25 ENCOUNTER — Ambulatory Visit: Payer: Medicare HMO | Admitting: Orthopaedic Surgery

## 2019-03-25 ENCOUNTER — Ambulatory Visit (INDEPENDENT_AMBULATORY_CARE_PROVIDER_SITE_OTHER): Payer: Medicare HMO

## 2019-03-25 ENCOUNTER — Other Ambulatory Visit: Payer: Self-pay

## 2019-03-25 ENCOUNTER — Ambulatory Visit: Payer: Self-pay

## 2019-03-25 ENCOUNTER — Telehealth: Payer: Self-pay

## 2019-03-25 VITALS — Ht 68.0 in | Wt 170.0 lb

## 2019-03-25 DIAGNOSIS — M25562 Pain in left knee: Secondary | ICD-10-CM

## 2019-03-25 DIAGNOSIS — M1711 Unilateral primary osteoarthritis, right knee: Secondary | ICD-10-CM | POA: Diagnosis not present

## 2019-03-25 DIAGNOSIS — M7062 Trochanteric bursitis, left hip: Secondary | ICD-10-CM | POA: Diagnosis not present

## 2019-03-25 DIAGNOSIS — M1712 Unilateral primary osteoarthritis, left knee: Secondary | ICD-10-CM | POA: Diagnosis not present

## 2019-03-25 DIAGNOSIS — M170 Bilateral primary osteoarthritis of knee: Secondary | ICD-10-CM

## 2019-03-25 DIAGNOSIS — M25561 Pain in right knee: Secondary | ICD-10-CM

## 2019-03-25 NOTE — Telephone Encounter (Signed)
Left knee gel injection ?

## 2019-03-25 NOTE — Progress Notes (Signed)
Office Visit Note   Patient: Sean Koch           Date of Birth: 24-Jul-1940           MRN: [id number] Visit Date: 03/25/2019              Requested by: Venia Carbon, MD [place],  [place] [address] PCP: Venia Carbon, MD   Assessment & Plan: Visit Diagnoses:  1. Left knee pain, unspecified chronicity   2. Right knee pain, unspecified chronicity   3. Unilateral primary osteoarthritis, left knee   4. Unilateral primary osteoarthritis, right knee   5. Trochanteric bursitis, left hip     Plan: Given his increase in the hemoglobin A1c I would not recommend a steroid right now.  I feel that he is more of a candidate for hyaluronic acid for the osteoarthritis pain of his left knee.  He is also a candidate for outpatient physical therapy to work on any modalities that can help with his left hip trochanteric bursitis and strengthen the muscles around the left knee.  He agrees with this treatment plan.  All question concerns were answered addressed.  I would like to see him back in 4 weeks to see how he is doing overall.  If he still having significant left hip pain I would like a low AP pelvis and lateral of his left hip.  Follow-Up Instructions: Return in about 4 weeks (around 04/22/2019).   Orders:  Orders Placed This Encounter  Procedures   XR Knee 1-2 Views Left   No orders of the defined types were placed in this encounter.     Procedures: No procedures performed   Clinical Data: No additional findings.   Subjective: Chief Complaint  Patient presents with   Left Knee - Pain   Right Knee - Pain  The patient is a very pleasant 79 year old gentleman who I am seeing for the first time.  He comes in with a history of left knee pain as well as left hip pain.  He reports a history of being a 79 year old that was in some type of accident in which he had his left hip dislocated.  He said it was pulled back in place.  He reports left hip pain but  over the trochanteric area of his left hip.  Most of his pain is with his left knee.  He denies any swelling and the pain is mostly with walking.  It has been worsening for a year now.  He is a diabetic and reports that his hemoglobin A1c is usually running around 7 but last week it was up to 7.5.  He is now on any blood thinning medications.  He is trying to be as active as possible and mobile as possible.  HPI  Review of Systems He currently denies any headache, chest pain, shortness of breath, fever, chills, nausea, vomiting  Objective: Vital Signs: Ht 5\' 8"  (1.727 m)    Wt 170 lb (77.1 kg)    BMI 25.85 kg/m   Physical Exam He is alert and oriented x3 and in no acute distress Ortho Exam Examination of his left knee does show varus malalignment.  He lacks full extension by few degrees and his flexion is also limited past 90 degrees with significant pain.  There is no effusion.  His left hip moves more fluid with internal extra rotation with pain only to palpation of the trochanteric area of the left hip. Specialty Comments:  No specialty comments available.  Imaging: XR Knee 1-2 Views Left  Result Date: 03/25/2019 2 views of the left knee show severe tricompartmental arthritic changes of the left knee.  There is varus malalignment.  There are large posterior periarticular osteophytes with the left knee joint.  There is also significant patellofemoral disease and almost complete loss of the lateral compartment.    PMFS History: Patient Active Problem List   Diagnosis Date Noted   Bilateral knee pain 03/19/2019   Aortic atherosclerosis ([place]) 03/21/2018   Chronic renal disease, stage III 09/20/2017   Sciatica 06/01/2016   Advance directive discussed with patient 03/26/2014   BPPV (benign paroxysmal positional vertigo) 09/26/2013   Arthritis of hand 09/10/2013   Personal history of colonic polyps - 2 small adenomas 07/15/2013   Unspecified constipation 03/01/2012    Osteoarthritis, knee 05/23/2011   Routine general medical examination at a health care facility 03/14/2011   Essential hypertension, benign 03/02/2010   NEPHROLITHIASIS, HX OF 02/28/2007   Type 2 diabetes mellitus with neurological manifestations, controlled ([place]) 08/26/2006   History of melanoma 08/22/2006   Hypothyroidism 08/22/2006   Hyperlipemia 08/22/2006   ERECTILE DYSFUNCTION 08/22/2006   Reflux esophagitis 08/22/2006   Past Medical History:  Diagnosis Date   Carpal tunnel syndrome of right wrist    Cataract    ED (erectile dysfunction)    GERD (gastroesophageal reflux disease)    History of nephrolithiasis    Hyperlipidemia    Melanoma ([facility])    NIDDM, uncontrolled, with neuropathy    Personal history of colonic polyps - 2 small adenomas 07/15/2013   Renal disorder    kidney stones   Seronegative rheumatoid arthritis ([place])    Thyroid disease     Family History  Problem Relation Age of Onset   Heart disease Mother    Diabetes Mother    Heart disease Brother    Cancer Neg Hx    Colon cancer Neg Hx     Past Surgical History:  Procedure Laterality Date   CATARACT EXTRACTION W/ INTRAOCULAR LENS IMPLANT Bilateral 11/14 & 2/15   CHOLECYSTECTOMY     KNEE ARTHROSCOPY     MELANOMA EXCISION     upper right arm   PILONIDAL CYST / SINUS EXCISION     ROTATOR CUFF REPAIR     TOE SURGERY     Social History   Occupational History   Occupation: retired Geneticist, molecular  Tobacco Use   Smoking status: Never Smoker   Smokeless tobacco: Never Used  Substance and Sexual Activity   Alcohol use: Yes    Comment: 1 glass wine monthly per pt.   Drug use: No   Sexual activity: Not on file

## 2019-03-26 ENCOUNTER — Telehealth: Payer: Self-pay

## 2019-03-26 NOTE — Telephone Encounter (Signed)
Approved for Monovisc-Left knee  Dr. Margarito Liner and Bill $25 copay 20% OOP Prior auth required with cohere [employer] auth # [id number] Dates: 03/26/19-09/26/19

## 2019-03-26 NOTE — Telephone Encounter (Signed)
Submitted for VOB for Monovisc-Left knee 

## 2019-03-27 ENCOUNTER — Telehealth: Payer: Self-pay

## 2019-03-27 NOTE — Telephone Encounter (Signed)
[facility] pharmacy(Did not leave a name in v/m) left v/m that needs clarification of dose for Simvastatin 80 mg taking one daily. Per caller usual dose for Simvastatin is 40 mg to reduce risk of myopathy; wants to know if provider is aware of risk and should [facility] pharmacy continue to fill the Simvastatin 80 mg. Pt last seen 03/19/19 for 6 mth FU.

## 2019-03-27 NOTE — Telephone Encounter (Signed)
He has been on this dose of simvastatin for at least 7 years--so it is not necessary to reduce the dose

## 2019-03-27 NOTE — Telephone Encounter (Signed)
Pt informed of approval and copay and stated understanding. Pt scheduled for 04/22/19-Dr.Blackman

## 2019-03-27 NOTE — Telephone Encounter (Signed)
New start

## 2019-03-27 NOTE — Telephone Encounter (Signed)
Spoke to pharmacist

## 2019-03-28 ENCOUNTER — Telehealth: Payer: Self-pay

## 2019-03-28 NOTE — Telephone Encounter (Signed)
Pt said he did not get the new rx for losartan 50 mg taking one by mouth daily. The rx for losartan 50 mg is on pts currernt med list but has will refill later. Pt said OK he has plenty of the losartan 100 mg that pt is cutting in half. Pt said he will cb when he needs the losartan 50 mg sent to pharmacy.pt said nothing further needed at this time.

## 2019-04-02 ENCOUNTER — Other Ambulatory Visit: Payer: Self-pay

## 2019-04-02 ENCOUNTER — Ambulatory Visit (INDEPENDENT_AMBULATORY_CARE_PROVIDER_SITE_OTHER): Payer: Medicare HMO | Admitting: Physical Therapy

## 2019-04-02 DIAGNOSIS — R262 Difficulty in walking, not elsewhere classified: Secondary | ICD-10-CM | POA: Diagnosis not present

## 2019-04-02 DIAGNOSIS — M25552 Pain in left hip: Secondary | ICD-10-CM | POA: Diagnosis not present

## 2019-04-02 DIAGNOSIS — M6281 Muscle weakness (generalized): Secondary | ICD-10-CM

## 2019-04-02 DIAGNOSIS — M25562 Pain in left knee: Secondary | ICD-10-CM

## 2019-04-02 DIAGNOSIS — M25662 Stiffness of left knee, not elsewhere classified: Secondary | ICD-10-CM

## 2019-04-02 NOTE — Patient Instructions (Signed)
Access Code: [email or web address] URL: [email or web address] Date: 04/02/2019 Prepared by: Kearney Hard  Exercises Supine Short Arc Quad - 2 x daily - 7 x weekly - 2 sets - 10 reps - 5 seconds hold Hooklying Clamshell with Resistance - 2 x daily - 7 x weekly - 2 sets - 10 reps - 5 seconds hold Heel rises with counter support - 2 x daily - 7 x weekly - 2 sets - 10 reps Standing March with Counter Support - 2 x daily - 7 x weekly - 2 sets - 10 reps

## 2019-04-02 NOTE — Therapy (Signed)
[facility] Physical Therapy [address] [place], [place], [address] Phone: [phone]   Fax:  [phone]  Physical Therapy Evaluation  Patient Details  Name: Sean Koch MRN: [id number] Date of Birth: August 08, 1940 Referring Provider (PT): Jean Rosenthal, MD   Encounter Date: 04/02/2019  PT End of Session - 04/02/19 1323    Visit Number  1    Number of Visits  13    PT Start Time  [id number]    PT Stop Time  1355    PT Time Calculation (min)  40 min    Activity Tolerance  Patient tolerated treatment well    Behavior During Therapy  [facility] for tasks assessed/performed       Past Medical History:  Diagnosis Date  . Carpal tunnel syndrome of right wrist   . Cataract   . ED (erectile dysfunction)   . GERD (gastroesophageal reflux disease)   . History of nephrolithiasis   . Hyperlipidemia   . Melanoma ([place])   . NIDDM, uncontrolled, with neuropathy   . Personal history of colonic polyps - 2 small adenomas 07/15/2013  . Renal disorder    kidney stones  . Seronegative rheumatoid arthritis ([place])   . Thyroid disease     Past Surgical History:  Procedure Laterality Date  . CATARACT EXTRACTION W/ INTRAOCULAR LENS IMPLANT Bilateral 11/14 & 2/15  . CHOLECYSTECTOMY    . KNEE ARTHROSCOPY    . MELANOMA EXCISION     upper right arm  . PILONIDAL CYST / SINUS EXCISION    . ROTATOR CUFF REPAIR    . TOE SURGERY      There were no vitals filed for this visit.   Subjective Assessment - 04/02/19 1320    Subjective  Pt arriving to therapy reporting L knee pain which can increase to 5/10 and L hip pain which can increas to 4/10 with walking. Pt reporting no pain at rest.    How long can you walk comfortably?  5 minutes    Patient Stated Goals  I would like to walk without pain    Currently in Pain?  No/denies         [facility] PT Assessment - 04/02/19 0001      Assessment   Medical Diagnosis  L knee pain, M25.562, L trochanteric bursitis M70.62    Referring Provider (PT)  Jean Rosenthal, MD    Hand Dominance  Right    Prior Therapy  no      Precautions   Precautions  None      Restrictions   Weight Bearing Restrictions  No      Balance Screen   Has the patient fallen in the past 6 months  No    Is the patient reluctant to leave their home because of a fear of falling?   No      Home Environment   Living Environment  Private residence    Living Arrangements  Spouse/significant other    Type of [place] to enter    Entrance Stairs-Number of Steps  6    Entrance Stairs-Rails  Cannot reach both    Crest  Two level    Additional Comments  pt goes upstairs to work on his computer      Prior Function   Level of [place]  Retired    Leisure  watch t.v      Cognition   Overall Cognitive Status  Within Functional Limits for tasks assessed      Posture/Postural Control   Posture/Postural Control  Postural limitations    Postural Limitations  Rounded Shoulders;Forward head;Decreased lumbar lordosis      ROM / Strength   AROM / PROM / Strength  AROM;Strength      AROM   Overall AROM   Deficits    AROM Assessment Site  Hip;Knee    Right/Left Hip  Right;Left    Right Hip Flexion  105    Left Hip Flexion  100    Right/Left Knee  Right;Left    Right Knee Extension  0    Right Knee Flexion  120    Left Knee Extension  -15   -15 from 0 degrees extension   Left Knee Flexion  110      Strength   Overall Strength  Deficits    Strength Assessment Site  Hip;Knee    Right/Left Hip  Right;Left    Right Hip Flexion  4+/5    Right Hip Extension  4-/5    Right Hip ABduction  4-/5    Right Hip ADduction  4-/5    Left Hip Flexion  4/5    Left Hip ABduction  4-/5    Left Hip ADduction  4-/5    Right/Left Knee  Right;Left    Right Knee Flexion  5/5    Right Knee Extension  5/5    Left Knee Flexion  5/5    Left Knee Extension  5/5      Palpation   Palpation  comment  TTP L femoral condyle, L med/lateral knee      Transfers   Five time sit to stand comments   14.4 seconds UE support      Ambulation/Gait   Gait Comments  amb with no device with L knee flexed, decreased heel strike and decreased foot clearance                Objective measurements completed on examination: See above findings.              PT Education - 04/02/19 1322    Person(s) Educated  Patient    Methods  Explanation;Demonstration;Other (comment);Handout    Comprehension  Verbalized understanding;Returned demonstration          PT Long Term Goals - 04/02/19 1408      PT LONG TERM GOAL #1   Title  pt will be independent in his HEP and progression.    Baseline  pt issued initial  HEP on 04/02/2019 with constant verbal cues required for the exercises    Time  6    Period  Weeks    Status  New      PT LONG TERM GOAL #2   Title  Pt will improve his L knee flexion to >/= 115 degrees with no pain reported.    Baseline  110 with pain at end range    Time  6    Period  Weeks    Status  New      PT LONG TERM GOAL #3   Title  pt will be able to amb 20 minutes with pain in Left hip </= 2/10.    Baseline  pain can increase to 4-5/10 after walking less than 5 minutes.    Time  6    Period  Weeks    Status  New      PT LONG TERM GOAL #4   Title  Pt  will improve L hip abduction/adduction to >/= 4+/5 in order to improve gait and functional mobillity.    Baseline  see flow sheets    Time  6    Period  Weeks    Status  New             Plan - 04/02/19 1350    Clinical Impression Statement  Pt presenting today with dx of L knee pain and L trochanteric bursitis. Pt reporting pain varies in his L knee and hip and can increase to 5/10 after walking for less than 5 minutes. Pt reporting he loves to walk in his neighborhood for exercises and is currently having diffiulty. L knee AROM arc: 15-110 degrees. Pt lacking 15 degrees from full extension. Pt  amb with mild antalgic gait with decreased heel strike and incresaed knee flexion. Pt was issued a HEP for hip and knee exericses with repeated instructions given and handout.  Skilled PT needed to progress pt toward goals set with the below interventions.    Personal Factors and Comorbidities  Comorbidity 3+    Comorbidities  CT, cataracts, GERD, neuropathy, renal disorder, throid disease knee arthroscopy, rotator cuff repair, toe surgery    Examination-Activity Limitations  Other;Lift;Stand;Squat;Stairs    Examination-Participation Restrictions  Community Activity    Stability/Clinical Decision Making  Stable/Uncomplicated    Clinical Decision Making  Low    Rehab Potential  Good    PT Frequency  2x / week    PT Duration  6 weeks    PT Treatment/Interventions  Cryotherapy;Ultrasound;Moist Heat;Iontophoresis 4mg /ml Dexamethasone;Electrical Stimulation;Gait training;Stair training;Functional mobility training;Neuromuscular re-education;Balance training;Therapeutic exercise;Therapeutic activities;Patient/family education;Manual techniques;Passive range of motion;Dry needling;Taping    PT Next Visit Plan  Begin Nustep, LE stretching, strengthening, modalities, review HEP and add as appropriate.    PT Home Exercise Plan  [id number]    Consulted and Agree with Plan of Care  Patient       Patient will benefit from skilled therapeutic intervention in order to improve the following deficits and impairments:  Pain, Postural dysfunction, Decreased strength, Decreased activity tolerance, Decreased range of motion, Difficulty walking  Visit Diagnosis: Acute pain of left knee  Pain in left hip  Difficulty in walking, not elsewhere classified  Muscle weakness (generalized)  Stiffness of left knee, not elsewhere classified     Problem List Patient Active Problem List   Diagnosis Date Noted  . Bilateral knee pain 03/19/2019  . Aortic atherosclerosis ([place]) 03/21/2018  . Chronic renal disease,  stage III 09/20/2017  . Sciatica 06/01/2016  . Advance directive discussed with patient 03/26/2014  . BPPV (benign paroxysmal positional vertigo) 09/26/2013  . Arthritis of hand 09/10/2013  . Personal history of colonic polyps - 2 small adenomas 07/15/2013  . Unspecified constipation 03/01/2012  . Osteoarthritis, knee 05/23/2011  . Routine general medical examination at a health care facility 03/14/2011  . Essential hypertension, benign 03/02/2010  . NEPHROLITHIASIS, HX OF 02/28/2007  . Type 2 diabetes mellitus with neurological manifestations, controlled ([place]) 08/26/2006  . History of melanoma 08/22/2006  . Hypothyroidism 08/22/2006  . Hyperlipemia 08/22/2006  . ERECTILE DYSFUNCTION 08/22/2006  . Reflux esophagitis 08/22/2006    Oretha Caprice, MPT 04/02/2019, 2:19 PM  [facility] Physical Therapy [address] [place], [place], [address] Phone: [phone]   Fax:  [phone]  Name: Sean Koch MRN: [id number] Date of Birth: 11/10/1940

## 2019-04-19 ENCOUNTER — Telehealth: Payer: Self-pay | Admitting: Physical Therapy

## 2019-04-19 ENCOUNTER — Encounter: Payer: Medicare HMO | Admitting: Physical Therapy

## 2019-04-19 NOTE — Telephone Encounter (Signed)
Attempted to call regarding missed appointment, No answer and unable to leave message at primary contact.   Kessa Fairbairn PT, DPT, LAT, ATC  04/19/19  1:37 PM

## 2019-04-22 ENCOUNTER — Ambulatory Visit: Payer: Medicare HMO | Admitting: Orthopaedic Surgery

## 2019-04-23 ENCOUNTER — Telehealth: Payer: Self-pay | Admitting: Rehabilitative and Restorative Service Providers"

## 2019-04-23 ENCOUNTER — Encounter: Payer: Medicare HMO | Admitting: Rehabilitative and Restorative Service Providers"

## 2019-04-23 NOTE — Telephone Encounter (Signed)
Called Pt. 10 mins after appointment time with no answer.  Left message asking him to return phone call to office to confirm whether or not he would attend Thursday appointment.  Will cancel rest of appointments scheduled at this time until further contact due to clinic policy related to 2 no shows.   Scot Jun, PT, DPT, OCS, ATC 04/23/19  2:14 PM

## 2019-04-25 ENCOUNTER — Encounter: Payer: Medicare HMO | Admitting: Rehabilitative and Restorative Service Providers"

## 2019-04-30 ENCOUNTER — Encounter: Payer: Medicare HMO | Admitting: Rehabilitative and Restorative Service Providers"

## 2019-04-30 ENCOUNTER — Encounter: Payer: Self-pay | Admitting: Internal Medicine

## 2019-04-30 ENCOUNTER — Ambulatory Visit (INDEPENDENT_AMBULATORY_CARE_PROVIDER_SITE_OTHER): Payer: Medicare HMO | Admitting: Internal Medicine

## 2019-04-30 ENCOUNTER — Other Ambulatory Visit: Payer: Self-pay

## 2019-04-30 VITALS — BP 108/60 | HR 100 | Temp 97.2°F | Ht 68.0 in | Wt 177.0 lb

## 2019-04-30 DIAGNOSIS — S40862A Insect bite (nonvenomous) of left upper arm, initial encounter: Secondary | ICD-10-CM | POA: Diagnosis not present

## 2019-04-30 DIAGNOSIS — Z1839 Other retained organic fragments: Secondary | ICD-10-CM | POA: Insufficient documentation

## 2019-04-30 DIAGNOSIS — M795 Residual foreign body in soft tissue: Secondary | ICD-10-CM | POA: Diagnosis not present

## 2019-04-30 NOTE — Progress Notes (Signed)
Subjective:    Patient ID: Sean Koch, male    DOB: 1940-01-21, 79 y.o.   MRN: [id number]  HPI Here due to tick bite This visit occurred during the SARS-CoV-2 public health emergency.  Safety protocols were in place, including screening questions prior to the visit, additional usage of staff PPE, and extensive cleaning of exam room while observing appropriate contact time as indicated for disinfecting solutions.   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 aspirin EC 81 MG tablet Take 81 mg by mouth daily.    . Blood Glucose Monitoring Suppl (ONE TOUCH ULTRA SYSTEM KIT) w/Device KIT Use to test blood sugar once daily DX: E11.41 1 each 0  . Calcium Carb-Cholecalciferol 600-800 MG-UNIT TABS Take 2 tablets by mouth daily.     Marland Kitchen glipiZIDE (GLUCOTROL) 10 MG tablet TAKE 1 TABLET BY MOUTH  TWICE A DAY BEFORE MEALS 180 tablet 3  . glucose blood (ONE TOUCH ULTRA TEST) test strip Use as instructed, to test 2-3 times weekly dx: E11.49 100 each 3  . levothyroxine (SYNTHROID) 75 MCG tablet Take 1 tablet (75 mcg total) by mouth daily. 90 tablet 3  . losartan (COZAAR) 50 MG tablet Take 1 tablet (50 mg total) by mouth daily. 90 tablet 3  . metFORMIN (GLUCOPHAGE) 500 MG tablet TAKE 2 TABLETS BY MOUTH TWO TIMES DAILY WITH MEALS 360 tablet 3  . omeprazole (PRILOSEC) 20 MG capsule Take 1 capsule (20 mg total) by mouth daily. 90 capsule 3  . ONE TOUCH LANCETS MISC Use to test blood sugar 2-3 times weekly dx: E11.49 100 each 3  . simvastatin (ZOCOR) 80 MG tablet Take 1 tablet (80 mg total) by mouth at bedtime. 90 tablet 3  . sitaGLIPtin (JANUVIA) 100 MG tablet Take 1 tablet (100 mg total) by mouth daily. 90 tablet 3   No current  facility-administered medications on file prior to visit.    Allergies  Allergen Reactions  . Atorvastatin     REACTION: Joint aches    Past Medical History:  Diagnosis Date  . Carpal tunnel syndrome of right wrist   . Cataract   . ED (erectile dysfunction)   . GERD (gastroesophageal reflux disease)   . History of nephrolithiasis   . Hyperlipidemia   . Melanoma ([place])   . NIDDM, uncontrolled, with neuropathy   . Personal history of colonic polyps - 2 small adenomas 07/15/2013  . Renal disorder    kidney stones  . Seronegative rheumatoid arthritis ([place])   . Thyroid disease     Past Surgical History:  Procedure Laterality Date  . CATARACT EXTRACTION W/ INTRAOCULAR LENS IMPLANT Bilateral 11/14 & 2/15  . CHOLECYSTECTOMY    . KNEE ARTHROSCOPY    . MELANOMA EXCISION     upper right arm  . PILONIDAL CYST / SINUS EXCISION    . ROTATOR CUFF REPAIR    . TOE SURGERY      Family History  Problem Relation Age of Onset  . Heart disease Mother   . Diabetes Mother   . Heart disease Brother   . Cancer Neg Hx   . Colon cancer Neg Hx     Social History   Socioeconomic History  .  Marital status: Married    Spouse name: Not on file  . Number of children: 2  . Years of education: Not on file  . Highest education level: Not on file  Occupational History  . Occupation: retired Geneticist, molecular  Tobacco Use  . Smoking status: Never Smoker  . Smokeless tobacco: Never Used  Substance and Sexual Activity  . Alcohol use: Yes    Comment: 1 glass wine monthly per pt.  . Drug use: No  . Sexual activity: Not on file  Other Topics Concern  . Not on file  Social History Narrative   Has living will    Wife, then son Michiah, would be health care POA   Would accept resuscitation. No prolonged artificial ventilation.   Not sure about tube feeds   Social Determinants of Health   Financial Resource Strain:   . Difficulty of Paying Living Expenses:   Food Insecurity:   . Worried About  Charity fundraiser in the Last Year:   . Arboriculturist in the Last Year:   Transportation Needs:   . Film/video editor (Medical):   Marland Kitchen Lack of Transportation (Non-Medical):   Physical Activity:   . Days of Exercise per Week:   . Minutes of Exercise per Session:   Stress:   . Feeling of Stress :   Social Connections:   . Frequency of Communication with Friends and Family:   . Frequency of Social Gatherings with Friends and Family:   . Attends Religious Services:   . Active Member of Clubs or Organizations:   . Attends Archivist Meetings:   Marland Kitchen Marital Status:   Intimate Partner Violence:   . Fear of Current or Ex-Partner:   . Emotionally Abused:   Marland Kitchen Physically Abused:   . Sexually Abused:    Review of Systems No fever Doesn't feel sick    Objective:   Physical Exam  Skin:  Medial part of upper left arm---small open area with apparent retained tick parts Mild surrounding redness--but not warm or tender           Assessment & Plan:

## 2019-04-30 NOTE — Assessment & Plan Note (Signed)
With fine forceps and clamp--retained parts removed completely Wound covered with antibiotic ointment and bandaid Discussed home care---cleaning and topical antibiotic

## 2019-05-03 ENCOUNTER — Encounter: Payer: Medicare HMO | Admitting: Physical Therapy

## 2019-05-07 ENCOUNTER — Encounter: Payer: Medicare HMO | Admitting: Rehabilitative and Restorative Service Providers"

## 2019-05-09 ENCOUNTER — Encounter: Payer: Medicare HMO | Admitting: Rehabilitative and Restorative Service Providers"

## 2019-05-14 ENCOUNTER — Encounter: Payer: Medicare HMO | Admitting: Rehabilitative and Restorative Service Providers"

## 2019-05-16 ENCOUNTER — Encounter: Payer: Medicare HMO | Admitting: Rehabilitative and Restorative Service Providers"

## 2019-10-01 ENCOUNTER — Encounter: Payer: Medicare HMO | Admitting: Internal Medicine

## 2019-10-14 ENCOUNTER — Ambulatory Visit (INDEPENDENT_AMBULATORY_CARE_PROVIDER_SITE_OTHER): Payer: Medicare HMO | Admitting: Internal Medicine

## 2019-10-14 ENCOUNTER — Other Ambulatory Visit: Payer: Self-pay

## 2019-10-14 ENCOUNTER — Encounter: Payer: Self-pay | Admitting: Internal Medicine

## 2019-10-14 VITALS — BP 106/66 | HR 72 | Temp 97.3°F | Ht 66.75 in | Wt 170.0 lb

## 2019-10-14 DIAGNOSIS — G3184 Mild cognitive impairment, so stated: Secondary | ICD-10-CM | POA: Insufficient documentation

## 2019-10-14 DIAGNOSIS — Z7189 Other specified counseling: Secondary | ICD-10-CM | POA: Diagnosis not present

## 2019-10-14 DIAGNOSIS — N1831 Chronic kidney disease, stage 3a: Secondary | ICD-10-CM | POA: Diagnosis not present

## 2019-10-14 DIAGNOSIS — E039 Hypothyroidism, unspecified: Secondary | ICD-10-CM

## 2019-10-14 DIAGNOSIS — Z0000 Encounter for general adult medical examination without abnormal findings: Secondary | ICD-10-CM

## 2019-10-14 DIAGNOSIS — E1149 Type 2 diabetes mellitus with other diabetic neurological complication: Secondary | ICD-10-CM | POA: Diagnosis not present

## 2019-10-14 DIAGNOSIS — Z23 Encounter for immunization: Secondary | ICD-10-CM | POA: Diagnosis not present

## 2019-10-14 DIAGNOSIS — R413 Other amnesia: Secondary | ICD-10-CM | POA: Diagnosis not present

## 2019-10-14 DIAGNOSIS — I10 Essential (primary) hypertension: Secondary | ICD-10-CM

## 2019-10-14 DIAGNOSIS — I700 Atherosclerosis of aorta: Secondary | ICD-10-CM | POA: Diagnosis not present

## 2019-10-14 LAB — HM DIABETES FOOT EXAM

## 2019-10-14 MED ORDER — SIMVASTATIN 40 MG PO TABS: 40.0000 mg | ORAL_TABLET | Freq: Every day | ORAL | 3 refills | Status: DC

## 2019-10-14 NOTE — Patient Instructions (Addendum)
Please decrease the simvastatin to 40mg  daily. Try voltaren gel (diclofenac) for your painful left knee. Please come back in if you or your wife feels your memory is declining more.

## 2019-10-14 NOTE — Progress Notes (Signed)
Hearing Screening   Method: Audiometry   125Hz  250Hz  500Hz  1000Hz  2000Hz  3000Hz  4000Hz  6000Hz  8000Hz   Right ear:   20 20 20   0    Left ear:   20 20 20   0    Vision Screening Comments: November 2020

## 2019-10-14 NOTE — Assessment & Plan Note (Signed)
Is on ARB °

## 2019-10-14 NOTE — Assessment & Plan Note (Signed)
I have personally reviewed the Medicare Annual Wellness questionnaire and have noted 1. The patient's medical and social history 2. Their use of alcohol, tobacco or illicit drugs 3. Their current medications and supplements 4. The patient's functional ability including ADL's, fall risks, home safety risks and hearing or visual             impairment. 5. Diet and physical activities 6. Evidence for depression or mood disorders  The patients weight, height, BMI and visual acuity have been recorded in the chart I have made referrals, counseling and provided education to the patient based review of the above and I have provided the pt with a written personalized care plan for preventive services.  I have provided you with a copy of your personalized plan for preventive services. Please take the time to review along with your updated medication list.  Discussed increasing exercise No cancer screening due to age Flu vaccine today Consider shingrix at pharmacy COVID booster soon

## 2019-10-14 NOTE — Assessment & Plan Note (Signed)
See social history 

## 2019-10-14 NOTE — Assessment & Plan Note (Signed)
On imaging Will continue the simvastatin

## 2019-10-14 NOTE — Assessment & Plan Note (Signed)
Vague Objectively, no change from last year----and no functional change Will check labs Recheck and MRI if progresses

## 2019-10-14 NOTE — Progress Notes (Signed)
 Subjective:    Patient ID: Sean Koch, male    DOB: 02/25/1940, 79 y.o.   MRN: [id number]  HPI Here for Medicare wellness visit and follow up of chronic health conditions This visit occurred during the SARS-CoV-2 public health emergency.  Safety protocols were in place, including screening questions prior to the visit, additional usage of staff PPE, and extensive cleaning of exam room while observing appropriate contact time as indicated for disinfecting solutions.   Reviewed form and advanced directives Reviewed other doctors No alcohol or tobacco Hasn't been exercising---due to left knee pain Vision is fine Hearing is okay No falls No depression or anhedonia Independent with instrumental ADLs  Having trouble with his memory Trouble with names No functional issues He notes this "once in a while"---not all the time Wife does note some concern about his memory but this is vague  Reviewed last labs Cholesterol quite low GFR 54--CKD 3a  Not checking sugars ---- ~once a month Usually ~120 or so No hypglycemic Some foot pain at night---doesn't keep him up though  Known aortic atherosclerosis on imaging  Continues on the omeprazole (he thinks) No heartburn No dysphagia  No chest pain No SOB No palpitations No dizziness or syncope No edema No headaches  Current Outpatient Medications on File Prior to Visit  Medication Sig Dispense Refill  . aspirin EC 81 MG tablet Take 81 mg by mouth daily.    . Blood Glucose Monitoring Suppl (ONE TOUCH ULTRA SYSTEM KIT) w/Device KIT Use to test blood sugar once daily DX: E11.41 1 each 0  . Calcium Carb-Cholecalciferol 600-800 MG-UNIT TABS Take 2 tablets by mouth daily.     . glipiZIDE (GLUCOTROL) 10 MG tablet TAKE 1 TABLET BY MOUTH  TWICE A DAY BEFORE MEALS 180 tablet 3  . glucose blood (ONE TOUCH ULTRA TEST) test strip Use as instructed, to test 2-3 times weekly dx: E11.49 100 each 3  . levothyroxine (SYNTHROID) 75 MCG tablet  Take 1 tablet (75 mcg total) by mouth daily. 90 tablet 3  . losartan (COZAAR) 50 MG tablet Take 1 tablet (50 mg total) by mouth daily. 90 tablet 3  . metFORMIN (GLUCOPHAGE) 500 MG tablet TAKE 2 TABLETS BY MOUTH TWO TIMES DAILY WITH MEALS 360 tablet 3  . omeprazole (PRILOSEC) 20 MG capsule Take 1 capsule (20 mg total) by mouth daily. 90 capsule 3  . ONE TOUCH LANCETS MISC Use to test blood sugar 2-3 times weekly dx: E11.49 100 each 3  . sitaGLIPtin (JANUVIA) 100 MG tablet Take 1 tablet (100 mg total) by mouth daily. 90 tablet 3  . [DISCONTINUED] simvastatin (ZOCOR) 80 MG tablet Take 1 tablet (80 mg total) by mouth at bedtime. 90 tablet 3   No current facility-administered medications on file prior to visit.    Allergies  Allergen Reactions  . Atorvastatin     REACTION: Joint aches    Past Medical History:  Diagnosis Date  . Carpal tunnel syndrome of right wrist   . Cataract   . ED (erectile dysfunction)   . GERD (gastroesophageal reflux disease)   . History of nephrolithiasis   . Hyperlipidemia   . Melanoma ([facility])   . NIDDM, uncontrolled, with neuropathy   . Personal history of colonic polyps - 2 small adenomas 07/15/2013  . Renal disorder    kidney stones  . Seronegative rheumatoid arthritis ([facility])   . Thyroid disease     Past Surgical History:  Procedure Laterality Date  . CATARACT EXTRACTION W/ INTRAOCULAR   LENS IMPLANT Bilateral 11/14 & 2/15  . CHOLECYSTECTOMY    . KNEE ARTHROSCOPY    . MELANOMA EXCISION     upper right arm  . PILONIDAL CYST / SINUS EXCISION    . ROTATOR CUFF REPAIR    . TOE SURGERY      Family History  Problem Relation Age of Onset  . Heart disease Mother   . Diabetes Mother   . Heart disease Brother   . Cancer Neg Hx   . Colon cancer Neg Hx     Social History   Socioeconomic History  . Marital status: Married    Spouse name: Not on file  . Number of children: 2  . Years of education: Not on file  . Highest education level: Not on file   Occupational History  . Occupation: retired Geneticist, molecular  Tobacco Use  . Smoking status: Never Smoker  . Smokeless tobacco: Never Used  Substance and Sexual Activity  . Alcohol use: Yes    Comment: 1 glass wine monthly per pt.  . Drug use: No  . Sexual activity: Not on file  Other Topics Concern  . Not on file  Social History Narrative   Has living will    Wife, then son Tyaire, would be health care POA   Would accept resuscitation. No prolonged artificial ventilation.   Not sure about tube feeds   Social Determinants of Health   Financial Resource Strain:   . Difficulty of Paying Living Expenses: Not on file  Food Insecurity:   . Worried About Charity fundraiser in the Last Year: Not on file  . Ran Out of Food in the Last Year: Not on file  Transportation Needs:   . Lack of Transportation (Medical): Not on file  . Lack of Transportation (Non-Medical): Not on file  Physical Activity:   . Days of Exercise per Week: Not on file  . Minutes of Exercise per Session: Not on file  Stress:   . Feeling of Stress : Not on file  Social Connections:   . Frequency of Communication with Friends and Family: Not on file  . Frequency of Social Gatherings with Friends and Family: Not on file  . Attends Religious Services: Not on file  . Active Member of Clubs or Organizations: Not on file  . Attends Archivist Meetings: Not on file  . Marital Status: Not on file  Intimate Partner Violence:   . Fear of Current or Ex-Partner: Not on file  . Emotionally Abused: Not on file  . Physically Abused: Not on file  . Sexually Abused: Not on file   Review of Systems Appetite is good Weight stable Sleeps okay Wears seat belt Teeth are fine---keeps up with dentist No skin lesions or rash now----will occasionally have blisters that then scab Bowels are fine---no blood Voids okay--nocturia x 1 at most. Flow is okay No other joint problems    Objective:   Physical  Exam Constitutional:      Appearance: Normal appearance.  HENT:     Mouth/Throat:     Comments: No lesions Eyes:     Conjunctiva/sclera: Conjunctivae normal.     Pupils: Pupils are equal, round, and reactive to light.  Cardiovascular:     Rate and Rhythm: Normal rate and regular rhythm.     Pulses: Normal pulses.     Heart sounds: No murmur heard.  No gallop.   Pulmonary:     Effort: Pulmonary effort is normal.  Breath sounds: Normal breath sounds. No wheezing or rales.  Abdominal:     Palpations: Abdomen is soft.     Tenderness: There is no abdominal tenderness.  Musculoskeletal:        General: No swelling.     Cervical back: Neck supple.     Right lower leg: No edema.     Left lower leg: No edema.  Lymphadenopathy:     Cervical: No cervical adenopathy.  Neurological:     Mental Status: He is alert and oriented to person, place, and time.     Comments: President---"Biden, Trump, Obama" 100-93-84-77-70-53-49 D-l-r-[id number]-w Recall 2/3  Fairly normal sensation in feet  Psychiatric:        Mood and Affect: Mood normal.        Behavior: Behavior normal.            Assessment & Plan:   

## 2019-10-14 NOTE — Assessment & Plan Note (Addendum)
Lab Results  Component Value Date   HGBA1C 7.6 (A) 03/19/2019   ssems to still have acceptable control Will check A1c on metformin, glipizide and januvia Mild neuropathy--no Rx

## 2019-10-14 NOTE — Assessment & Plan Note (Signed)
Seems euthyroid ?Will check labs ?

## 2019-10-14 NOTE — Assessment & Plan Note (Signed)
BP Readings from Last 3 Encounters:  10/14/19 106/66  04/30/19 108/60  03/19/19 102/60   Good control on losartan Will check labs

## 2019-10-15 ENCOUNTER — Other Ambulatory Visit: Payer: Self-pay | Admitting: Internal Medicine

## 2019-10-15 DIAGNOSIS — E538 Deficiency of other specified B group vitamins: Secondary | ICD-10-CM

## 2019-10-15 LAB — HEPATIC FUNCTION PANEL
ALT: 9 U/L (ref 0–53)
AST: 14 U/L (ref 0–37)
Albumin: 4.4 g/dL (ref 3.5–5.2)
Alkaline Phosphatase: 73 U/L (ref 39–117)
Bilirubin, Direct: 0.1 mg/dL (ref 0.0–0.3)
Total Bilirubin: 0.8 mg/dL (ref 0.2–1.2)
Total Protein: 6.9 g/dL (ref 6.0–8.3)

## 2019-10-15 LAB — RENAL FUNCTION PANEL
Albumin: 4.4 g/dL (ref 3.5–5.2)
BUN: 18 mg/dL (ref 6–23)
CO2: 29 mEq/L (ref 19–32)
Calcium: 10 mg/dL (ref 8.4–10.5)
Chloride: 101 mEq/L (ref 96–112)
Creatinine, Ser: 1.39 mg/dL (ref 0.40–1.50)
GFR: 47.02 mL/min — ABNORMAL LOW (ref >60.00)
Glucose, Bld: 211 mg/dL — ABNORMAL HIGH (ref 70–99)
Phosphorus: 3.3 mg/dL (ref 2.3–4.6)
Potassium: 4.7 mEq/L (ref 3.5–5.1)
Sodium: 138 mEq/L (ref 135–145)

## 2019-10-15 LAB — TSH: TSH: 3.21 u[IU]/mL (ref 0.35–4.50)

## 2019-10-15 LAB — CBC
HCT: 46.5 % (ref 39.0–52.0)
Hemoglobin: 15.4 g/dL (ref 13.0–17.0)
MCHC: 33.1 g/dL (ref 30.0–36.0)
MCV: 94 fl (ref 78.0–100.0)
Platelets: 203 10*3/uL (ref 150.0–400.0)
RBC: 4.94 Mil/uL (ref 4.22–5.81)
RDW: 13.7 % (ref 11.5–15.5)
WBC: 9.8 10*3/uL (ref 4.0–10.5)

## 2019-10-15 LAB — T4, FREE: Free T4: 0.96 ng/dL (ref 0.60–1.60)

## 2019-10-15 LAB — LIPID PANEL
Cholesterol: 131 mg/dL (ref 0–200)
HDL: 37.9 mg/dL — ABNORMAL LOW (ref >39.00)
LDL Cholesterol: 65 mg/dL (ref 0–99)
NonHDL: 92.63
Total CHOL/HDL Ratio: 3
Triglycerides: 138 mg/dL (ref 0.0–149.0)
VLDL: 27.6 mg/dL (ref 0.0–40.0)

## 2019-10-15 LAB — VITAMIN B12: Vitamin B-12: 110 pg/mL — ABNORMAL LOW (ref 211–911)

## 2019-10-15 LAB — HEMOGLOBIN A1C: Hgb A1c MFr Bld: 7.5 % — ABNORMAL HIGH (ref 4.6–6.5)

## 2019-10-15 LAB — PARATHYROID HORMONE, INTACT (NO CA): PTH: 37 pg/mL (ref 14–64)

## 2019-10-15 LAB — VITAMIN D 25 HYDROXY (VIT D DEFICIENCY, FRACTURES): VITD: 53 ng/mL (ref 30.00–100.00)

## 2019-10-16 ENCOUNTER — Telehealth: Payer: Self-pay

## 2019-10-16 NOTE — Telephone Encounter (Signed)
I spoke to patient and rescheduled appointment to 2:00.

## 2019-10-16 NOTE — Telephone Encounter (Signed)
[place] Night - Client Nonclinical Telephone Record AccessNurse Client [place] Night - Client Client Site [place] Physician Viviana Simpler- MD Contact Type Call Who Is Calling Patient / Member / Family / Caregiver Caller Name [place] Phone Number [phone] Call Type Message Only Information Provided Reason for Call Returning a Call from the Office Initial [place] states she is returning a call. Additional Comment Caller she is requesting for her appt to be change from 1pm to 2pm on November 9th Disp. Time Disposition Final User 10/15/2019 5:29:29 PM General Information Provided Yes Lawrence Santiago Call Closed By: Lawrence Santiago Transaction Date/Time: 10/15/2019 5:27:00 PM (ET)

## 2019-11-19 ENCOUNTER — Other Ambulatory Visit (INDEPENDENT_AMBULATORY_CARE_PROVIDER_SITE_OTHER): Payer: Medicare HMO

## 2019-11-19 ENCOUNTER — Other Ambulatory Visit: Payer: Medicare HMO

## 2019-11-19 DIAGNOSIS — E538 Deficiency of other specified B group vitamins: Secondary | ICD-10-CM | POA: Diagnosis not present

## 2019-11-19 LAB — VITAMIN B12: Vitamin B-12: 405 pg/mL (ref 211–911)

## 2020-01-06 ENCOUNTER — Other Ambulatory Visit: Payer: Self-pay

## 2020-01-06 MED ORDER — LOSARTAN POTASSIUM 50 MG PO TABS
50.0000 mg | ORAL_TABLET | Freq: Every day | ORAL | 3 refills | Status: DC
Start: 2020-01-06 — End: 2020-01-22

## 2020-01-10 ENCOUNTER — Other Ambulatory Visit: Payer: Self-pay | Admitting: Internal Medicine

## 2020-01-13 ENCOUNTER — Telehealth: Payer: Self-pay

## 2020-01-13 NOTE — Telephone Encounter (Signed)
Pt said for 4 days when pt stands up or moves around pt has dizziness where the room spins around. The spinning stops when pt sits or lays down. Pt has never had this before. Pt cannot ck BP now since pts wife is not at home and pt cannot get to closet to get BP cuff. Pt has no covid symptoms, no vision changes, and no H/A or CP. Pt does not want to make appt. Pt request cb after Dr Alphonsus Sias reviews this note. UC & ED precautions given and pt voiced understanding. [facility].

## 2020-01-13 NOTE — Telephone Encounter (Signed)
Spoke to pt. Stated he understood.

## 2020-01-13 NOTE — Telephone Encounter (Signed)
It sounds like vertigo---can try OTC meclizine 25 mg up to three times a day. If symptoms persist, or having trouble walking, could indicate a stroke and he really needs to be checked

## 2020-01-17 ENCOUNTER — Telehealth: Payer: Self-pay | Admitting: Internal Medicine

## 2020-01-17 NOTE — Telephone Encounter (Signed)
Pt called in wanted to know about what to do due to he is still having dizzy spells even on the medication the dizzy spell started a week ago, and wanted to know about what to do.  Please advise

## 2020-01-20 NOTE — Telephone Encounter (Signed)
[place] Day - Client TELEPHONE ADVICE RECORD AccessNurse Patient Name: Sean Koch Gender: Male DOB: 1940-06-01 Age: 80 Y 9 M 8 D Return Phone Number: [phone] (Primary), [phone] (Secondary) Address: [address] City/State/Zip: [place] [address] Client [facility] - Client Client Site [place] - Day Physician Viviana Simpler- MD Contact Type Call Who Is Calling Patient / Member / Family / Caregiver Call Type Triage / Clinical Relationship To Patient Self Return Phone Number [phone] (Primary) Chief Complaint Dizziness Reason for Call Symptomatic / Request for [place] state he is having dizzy spells even after taking medication that was prescribed. Translation No Nurse Assessment Nurse: Marcelline Deist, RN, Mickel Baas Date/Time Eilene Ghazi Time): 01/17/2020 4:38:35 PM Confirm and document reason for call. If symptomatic, describe symptoms. ---Caller state he is having dizzy spells even after taking medication that was prescribed. States his dizzy spells are a little better. Does the patient have any new or worsening symptoms? ---Yes Will a triage be completed? ---Yes Related visit to physician within the last 2 weeks? ---No Does the PT have any chronic conditions? (i.e. diabetes, asthma, this includes High risk factors for pregnancy, etc.) ---Yes List chronic conditions. ---Diabetic Is this a behavioral health or substance abuse call? ---No Guidelines Guideline Title Affirmed Question Affirmed Notes Nurse Date/Time (Eastern Time) Dizziness - Vertigo [1] NO dizziness now AND [2] one or more stroke risk factors (i.e., hypertension, diabetes, prior stroke/TIA/heart attack) Marcelline Deist, RN, Mickel Baas 01/17/2020 4:42:00 PM Disp. Time Eilene Ghazi Time) Disposition Final User 01/17/2020 4:16:26 PM Attempt made - message left Roe Rutherford 01/17/2020 4:46:21  PM See PCP within 24 Hours Yes Marcelline Deist, RN, Mickel Baas PLEASE NOTE: All timestamps contained within this report are represented as [place] Standard Time. CONFIDENTIALTY NOTICE: This fax transmission is intended only for the addressee. It contains information that is legally privileged, confidential or otherwise protected from use or disclosure. If you are not the intended recipient, you are strictly prohibited from reviewing, disclosing, copying using or disseminating any of this information or taking any action in reliance on or regarding this information. If you have received this fax in error, please notify us immediately by telephone so that we can arrange for its return to [place]. Phone: [phone], Toll-Free: [phone], Fax: [phone] Page: 2 of 2 Call Id: [id number] [place] Disagree/Comply Comply Caller Understands Yes PreDisposition Call Doctor Care Advice Given Per Guideline SEE PCP WITHIN 24 HOURS: FALL PREVENTION: * Sit at side of bed for several minutes before standing up. Stand up slowly. * Avoid sudden movements of head. CALL BACK IF: * Severe headache occurs * Weakness develops in an arm or leg * Unable to walk without falling * Dizziness becomes constant * You become worse CARE ADVICE given per Dizziness - Vertigo (Adult) guideline. Comments User: Electa Sniff Date/Time Eilene Ghazi Time): 01/17/2020 4:13:33 PM Office transferred patient and states Dr. Silvio Pate doesn't have an appointment available to see him until next week. User: Talmadge Chad, RN Date/Time Eilene Ghazi Time): 01/17/2020 4:49:16 PM Connected pt to staff at the office to help set up an appt. Referrals REFERRED TO PCP OFFICE

## 2020-01-20 NOTE — Telephone Encounter (Signed)
Okay---can check him then

## 2020-01-22 ENCOUNTER — Other Ambulatory Visit: Payer: Self-pay

## 2020-01-22 ENCOUNTER — Encounter: Payer: Self-pay | Admitting: Internal Medicine

## 2020-01-22 ENCOUNTER — Ambulatory Visit (INDEPENDENT_AMBULATORY_CARE_PROVIDER_SITE_OTHER): Payer: Medicare HMO | Admitting: Internal Medicine

## 2020-01-22 VITALS — BP 98/60 | HR 80 | Temp 97.6°F | Ht 67.0 in | Wt 178.0 lb

## 2020-01-22 DIAGNOSIS — N1831 Chronic kidney disease, stage 3a: Secondary | ICD-10-CM | POA: Diagnosis not present

## 2020-01-22 DIAGNOSIS — R42 Dizziness and giddiness: Secondary | ICD-10-CM | POA: Insufficient documentation

## 2020-01-22 DIAGNOSIS — E1149 Type 2 diabetes mellitus with other diabetic neurological complication: Secondary | ICD-10-CM

## 2020-01-22 LAB — RENAL FUNCTION PANEL
Albumin: 4.3 g/dL (ref 3.5–5.2)
BUN: 15 mg/dL (ref 6–23)
CO2: 30 mEq/L (ref 19–32)
Calcium: 9.7 mg/dL (ref 8.4–10.5)
Chloride: 104 mEq/L (ref 96–112)
Creatinine, Ser: 1.4 mg/dL (ref 0.40–1.50)
GFR: 47.04 mL/min — ABNORMAL LOW (ref >60.00)
Glucose, Bld: 210 mg/dL — ABNORMAL HIGH (ref 70–99)
Phosphorus: 3.4 mg/dL (ref 2.3–4.6)
Potassium: 4.2 mEq/L (ref 3.5–5.1)
Sodium: 140 mEq/L (ref 135–145)

## 2020-01-22 LAB — CBC
HCT: 46.2 % (ref 39.0–52.0)
Hemoglobin: 15.5 g/dL (ref 13.0–17.0)
MCHC: 33.6 g/dL (ref 30.0–36.0)
MCV: 92.3 fl (ref 78.0–100.0)
Platelets: 198 10*3/uL (ref 150.0–400.0)
RBC: 5 Mil/uL (ref 4.22–5.81)
RDW: 13.6 % (ref 11.5–15.5)
WBC: 9.2 10*3/uL (ref 4.0–10.5)

## 2020-01-22 LAB — HEMOGLOBIN A1C: Hgb A1c MFr Bld: 7.6 % — ABNORMAL HIGH (ref 4.6–6.5)

## 2020-01-22 NOTE — Progress Notes (Signed)
Subjective:    Patient ID: Sean Koch, male    DOB: [birth year], 80 y.o.   MRN: [id number]  HPI Here due to dizziness With wife This visit occurred during the SARS-CoV-2 public health emergency.  Safety protocols were in place, including screening questions prior to the visit, additional usage of staff PPE, and extensive cleaning of exam room while observing appropriate contact time as indicated for disinfecting solutions.   Having dizziness/lightheadedness when he gets up Noticing for 2-3 weeks Starts right away--has to hold on Will walk from place to place holding on Will "lighten up" after a while (several minutes) Hasn't blacked out or fallen More noticeable in the morning  No chest pain or SOB No edema  Ran out of losartan for a while Did restart it a couple of weeks ago Last GFR 47  Current Outpatient Medications on File Prior to Visit  Medication Sig Dispense Refill  . aspirin EC 81 MG tablet Take 81 mg by mouth daily.    . Blood Glucose Monitoring Suppl (ONE TOUCH ULTRA SYSTEM KIT) w/Device KIT Use to test blood sugar once daily DX: E11.41 1 each 0  . Calcium Carb-Cholecalciferol 600-800 MG-UNIT TABS Take 2 tablets by mouth daily.     Marland Kitchen glipiZIDE (GLUCOTROL) 10 MG tablet TAKE 1 TABLET TWICE DAILY BEFORE MEALS 180 tablet 3  . glucose blood (ONE TOUCH ULTRA TEST) test strip Use as instructed, to test 2-3 times weekly dx: E11.49 100 each 3  . JANUVIA 100 MG tablet TAKE 1 TABLET EVERY DAY 90 tablet 3  . levothyroxine (SYNTHROID) 75 MCG tablet TAKE 1 TABLET EVERY DAY 90 tablet 3  . losartan (COZAAR) 50 MG tablet Take 1 tablet (50 mg total) by mouth daily. 90 tablet 3  . metFORMIN (GLUCOPHAGE) 500 MG tablet TAKE 2 TABLETS TWICE DAILY WITH MEALS 360 tablet 3  . omeprazole (PRILOSEC) 20 MG capsule TAKE 1 CAPSULE EVERY DAY 90 capsule 3  . ONE TOUCH LANCETS MISC Use to test blood sugar 2-3 times weekly dx: E11.49 100 each 3  . simvastatin (ZOCOR) 40 MG tablet Take 1 tablet  (40 mg total) by mouth at bedtime. 90 tablet 3   No current facility-administered medications on file prior to visit.    Allergies  Allergen Reactions  . Atorvastatin     REACTION: Joint aches    Past Medical History:  Diagnosis Date  . Carpal tunnel syndrome of right wrist   . Cataract   . ED (erectile dysfunction)   . GERD (gastroesophageal reflux disease)   . History of nephrolithiasis   . Hyperlipidemia   . Melanoma ([place])   . NIDDM, uncontrolled, with neuropathy   . Personal history of colonic polyps - 2 small adenomas 07/15/2013  . Renal disorder    kidney stones  . Seronegative rheumatoid arthritis ([place])   . Thyroid disease     Past Surgical History:  Procedure Laterality Date  . CATARACT EXTRACTION W/ INTRAOCULAR LENS IMPLANT Bilateral 11/14 & 2/15  . CHOLECYSTECTOMY    . KNEE ARTHROSCOPY    . MELANOMA EXCISION     upper right arm  . PILONIDAL CYST / SINUS EXCISION    . ROTATOR CUFF REPAIR    . TOE SURGERY      Family History  Problem Relation Age of Onset  . Heart disease Mother   . Diabetes Mother   . Heart disease Brother   . Cancer Neg Hx   . Colon cancer Neg Hx  Social History   Socioeconomic History  . Marital status: Married    Spouse name: Not on file  . Number of children: 2  . Years of education: Not on file  . Highest education level: Not on file  Occupational History  . Occupation: retired Geneticist, molecular  Tobacco Use  . Smoking status: Never Smoker  . Smokeless tobacco: Never Used  Substance and Sexual Activity  . Alcohol use: Yes    Comment: 1 glass wine monthly per pt.  . Drug use: No  . Sexual activity: Not on file  Other Topics Concern  . Not on file  Social History Narrative   Has living will    Wife, then son Husam, would be health care POA   Would accept resuscitation. No prolonged artificial ventilation.   Not sure about tube feeds   Social Determinants of Health   Financial Resource Strain: Not on file  Food  Insecurity: Not on file  Transportation Needs: Not on file  Physical Activity: Not on file  Stress: Not on file  Social Connections: Not on file  Intimate Partner Violence: Not on file   Review of Systems Eating well Weight stable Some trouble initiating sleep--then does okay No restriction of activity---but does very little (takes out trash, etc)    Objective:   Physical Exam Constitutional:      Appearance: Normal appearance.  Cardiovascular:     Rate and Rhythm: Normal rate and regular rhythm.     Heart sounds: No murmur heard. No gallop.   Pulmonary:     Effort: Pulmonary effort is normal.     Breath sounds: Normal breath sounds. No wheezing or rales.  Musculoskeletal:     Cervical back: Neck supple.     Right lower leg: No edema.     Left lower leg: No edema.  Lymphadenopathy:     Cervical: No cervical adenopathy.  Neurological:     Mental Status: He is alert.     Comments: Mildly unstable with standing Not ataxic though            Assessment & Plan:

## 2020-01-22 NOTE — Patient Instructions (Signed)
Please stop the losartan. Let me know if the lightheadedness doesn't improve off this medication.

## 2020-01-22 NOTE — Assessment & Plan Note (Signed)
Does not meet BP standards for orthostatic hypotension but does for pulse Will stop the losartan for now May be related to his diabetic neuropathy

## 2020-01-22 NOTE — Assessment & Plan Note (Signed)
Will recheck labs  Stop the losartan for now--reconsider adding 25mg  back at some time

## 2020-01-22 NOTE — Assessment & Plan Note (Signed)
Lab Results  Component Value Date   HGBA1C 7.5 (H) 10/14/2019   Not checking but sugars okay on glipizide, metformin and januvia Will recheck A1c

## 2020-02-04 ENCOUNTER — Telehealth: Payer: Self-pay

## 2020-02-04 NOTE — Telephone Encounter (Signed)
This should be okay to wait for PCP review in the am

## 2020-02-04 NOTE — Telephone Encounter (Signed)
Pt last seen on 01/22/20; pt stopped losartan on 01/22/20 as instructed but pt cannot see hardly any improvement in his lightheadedness and dizziness. Pt said when he gets up from a seated position; pt gets up slowly but room usually spins for few mins. And complete recovery from lightheadedness takes10-15 mins after standing up. Pt holds on to wall until symptoms are gone so will not fall. Pt said he cannot take his BP now and has not taken since he was seen on 01/22/20.  No H/A, SOB,CP,vision changes and no weakness or difficulty walking in either side. Pt said he is drinking his usual amt of water, pt urinating normally and no dry mouth or abd pain. UC & ED precautions given and pt voiced understanding. Pt will continue doing what he is doing until cb after Dr Alla German review. Sending note to Dr Silvio Pate who is out of office and Avie Echevaria NP who is in office and [facility] CMA.

## 2020-02-05 MED ORDER — MECLIZINE HCL 25 MG PO TABS: 25.0000 mg | ORAL_TABLET | Freq: Three times a day (TID) | ORAL | 1 refills | Status: DC | PRN

## 2020-02-05 NOTE — Telephone Encounter (Signed)
It sounds like it might be more of an inner ear problem the way he describes the spinning. Does he have symptoms if he looks up or turns head quickly? It may be worth trying meclizine 25mg  tid for a few days to see if it helps (send #90 x 1----tid prn) He should watch for sedation with this though

## 2020-02-05 NOTE — Telephone Encounter (Signed)
Spoke to pt. He said he didn't really have an issue when turning his head or looking up but would like to try the meclizine anyway. He understands it can make him feel sleepy. I have sent it in.

## 2020-02-05 NOTE — Telephone Encounter (Signed)
okay

## 2020-02-05 NOTE — Addendum Note (Signed)
Addended by: Pilar Grammes on: 02/05/2020 09:08 AM   Modules accepted: Orders

## 2020-02-10 ENCOUNTER — Telehealth: Payer: Self-pay | Admitting: Internal Medicine

## 2020-02-10 NOTE — Telephone Encounter (Addendum)
See notes below the access nurse note; Larene Beach RN has spoken with pt and Dr Silvio Pate also has note. Pt already has appt with Dr Silvio Pate scheduled on 02/12/20.

## 2020-02-10 NOTE — Telephone Encounter (Signed)
Access Nurse, Sean Koch, contacted office reporting pt was transferred to her. Pt c/o dizziness and is getting very irritated and yelling at the nurse and that meclizine was not helping. Disposition is for pt to be seen in office in next 4 hours.  Talked to pt and he reports dizziness has not gotten any better or worse. Pt denies any other symptoms. Pt requested apt. Advised opening tomorrow morning at 730 and pt said it is difficult for him to get out that early because he has to get his wife up and get someone to drive him. Requesting a different time for apt. advised a msg would be sent to PCP and this office would f/u. Pt verbalized understanding and was appreciative.

## 2020-02-10 NOTE — Telephone Encounter (Signed)
[place] [facility] - Client TELEPHONE ADVICE RECORD AccessNurse Patient Name: Sean Koch Gender: Male DOB: 03-20-1940 Age: 80 Y 10 M 1 D Return Phone Number: [phone] (Primary), [phone] (Secondary) Address: [address] City/State/Zip:  [place] [address] Client [facility] - Client Client Site [place] - Day Physician Viviana Simpler- MD Contact Type Call Who Is Calling Patient / Member / Family / Caregiver Call Type Triage / Clinical Relationship To Patient Self Return Phone Number [phone] (Primary) Chief Complaint Dizziness Reason for Call Symptomatic / Request for Sean Koch states he is dizzy and light headed. He is very irritable. Translation No Nurse Assessment Nurse: Clovis Riley, RN, Georgina Peer Date/Time (Eastern Time): 02/10/2020 12:12:06 PM Confirm and document reason for call. If symptomatic, describe symptoms. ---Caller states he is dizzy and light headed that started 2 weeks ago and was seen in the office. He is very irritable and yelling at me. States when he walks he feels like he may fall Does the patient have any new or worsening symptoms? ---Yes Will a triage be completed? ---Yes Related visit to physician within the last 2 weeks? ---No Does the PT have any chronic conditions? (i.e. diabetes, asthma, this includes High risk factors for pregnancy, etc.) ---Unknown Is this a behavioral health or substance abuse call? ---No Guidelines Guideline Title Affirmed Question Affirmed Notes Nurse Date/Time (Eastern Time) Dizziness - Lightheadedness [1] MODERATE dizziness (e.g., interferes with normal activities) AND [2] has NOT been evaluated by physician for this (Exception: dizziness caused by heat exposure, sudden standing, or poor fluid intake) [place], RN, Georgina Peer 02/10/2020 12:14:02 PM Disp. Time Eilene Ghazi Time) Disposition Final  User 02/10/2020 11:42:49 AM Attempt made - message left Clovis Riley, RNGeorgina Peer 02/10/2020 12:01:33 PM Attempt made - message left Clovis Riley, RN, Georgina Peer 02/10/2020 12:18:05 PM See PCP within 24 Hours Yes [place], RN, Georgina Peer PLEASE NOTE: All timestamps contained within this report are represented as [place] Standard Time. CONFIDENTIALTY NOTICE: This fax transmission is intended only for the addressee. It contains information that is legally privileged, confidential or otherwise protected from use or disclosure. If you are not the intended recipient, you are strictly prohibited from reviewing, disclosing, copying using or disseminating any of this information or taking any action in reliance on or regarding this information. If you have received this fax in error, please notify us immediately by telephone so that we can arrange for its return to [place]. Phone: [phone], Toll-Free: [phone], Fax: [phone] Page: 2 of 2 Call Id: [id number] [place] Disagree/Comply Comply Caller Understands Yes PreDisposition Call Doctor Care Advice Given Per Guideline SEE PCP WITHIN 24 HOURS: DRINK FLUIDS: * Drink several glasses of fruit juice, other clear fluids or water. * This will improve hydration and blood glucose. * If the weather is hot or you have a fever, make sure the fluids are cold. LIE DOWN AND REST: * Lie down with feet elevated for 1 hour. * This will improve circulation and increase blood flow to the brain. CARE ADVICE given per Dizziness (Adult) guideline. CALL BACK IF: * Passes out (faints) * You become worse Referrals REFERRED TO PCP OFFICE

## 2020-02-10 NOTE — Telephone Encounter (Signed)
Patient states that his medication is not working. Patient states that he is still lightheaded and dizzy. Patient is asking for something differently to take. Please advise patient EM Medication : meclizine

## 2020-02-10 NOTE — Telephone Encounter (Signed)
Patient scheduled appointment on 02/12/20 at 11:15.

## 2020-02-10 NOTE — Telephone Encounter (Signed)
This is not a new issue and not emergency. Okay to use appt on Wednesday (I can add on if same day is gone---but I can't add on tomorrow)

## 2020-02-12 ENCOUNTER — Ambulatory Visit (INDEPENDENT_AMBULATORY_CARE_PROVIDER_SITE_OTHER): Payer: Medicare HMO | Admitting: Internal Medicine

## 2020-02-12 ENCOUNTER — Other Ambulatory Visit: Payer: Self-pay

## 2020-02-12 ENCOUNTER — Encounter: Payer: Self-pay | Admitting: Internal Medicine

## 2020-02-12 DIAGNOSIS — E1149 Type 2 diabetes mellitus with other diabetic neurological complication: Secondary | ICD-10-CM | POA: Diagnosis not present

## 2020-02-12 DIAGNOSIS — I10 Essential (primary) hypertension: Secondary | ICD-10-CM

## 2020-02-12 DIAGNOSIS — R42 Dizziness and giddiness: Secondary | ICD-10-CM | POA: Insufficient documentation

## 2020-02-12 NOTE — Assessment & Plan Note (Signed)
BP Readings from Last 3 Encounters:  02/12/20 106/68  01/22/20 98/60  10/14/19 106/66   BP higher but symptoms persist Will have him stay off the losartan

## 2020-02-12 NOTE — Assessment & Plan Note (Signed)
Continues to have dizziness upon standing--now describes motion sensation he didn't before (?related to the meclizine) Persists while walking---mild ataxia  Can't do MRI Will consult with neurology on [place] ENT evaluation (?consider canalith repositioning) Also with hearing loss

## 2020-02-12 NOTE — Assessment & Plan Note (Signed)
Lab Results  Component Value Date   HGBA1C 7.6 (H) 01/22/2020   I suspect the vague dizziness/vertigo may have some basis in his neuropathy

## 2020-02-12 NOTE — Progress Notes (Signed)
Subjective:    Patient ID: Sean Koch, male    DOB: 05-28-1940, 80 y.o.   MRN: [id number]  HPI Here due to persistent dizziness With wife This visit occurred during the SARS-CoV-2 public health emergency.  Safety protocols were in place, including screening questions prior to the visit, additional usage of staff PPE, and extensive cleaning of exam room while observing appropriate contact time as indicated for disinfecting solutions.   May be some better but ongoing symptoms Stopping the losartan didn't make a difference When he stands up--"my head spins a bit"---has to hold on for a while Doesn't come on with head movement Continues with some of the symptoms till he sits  Tried the meclizine tid---helped briefly but then the dizziness came back  Walking okay---but not much now per wife  Current Outpatient Medications on File Prior to Visit  Medication Sig Dispense Refill  . aspirin EC 81 MG tablet Take 81 mg by mouth daily.    . Blood Glucose Monitoring Suppl (ONE TOUCH ULTRA SYSTEM KIT) w/Device KIT Use to test blood sugar once daily DX: E11.41 1 each 0  . Calcium Carb-Cholecalciferol 600-800 MG-UNIT TABS Take 2 tablets by mouth daily.     Marland Kitchen glipiZIDE (GLUCOTROL) 10 MG tablet TAKE 1 TABLET TWICE DAILY BEFORE MEALS 180 tablet 3  . glucose blood (ONE TOUCH ULTRA TEST) test strip Use as instructed, to test 2-3 times weekly dx: E11.49 100 each 3  . JANUVIA 100 MG tablet TAKE 1 TABLET EVERY DAY 90 tablet 3  . levothyroxine (SYNTHROID) 75 MCG tablet TAKE 1 TABLET EVERY DAY 90 tablet 3  . meclizine (ANTIVERT) 25 MG tablet Take 1 tablet (25 mg total) by mouth 3 (three) times daily as needed for dizziness. 90 tablet 1  . metFORMIN (GLUCOPHAGE) 500 MG tablet TAKE 2 TABLETS TWICE DAILY WITH MEALS 360 tablet 3  . omeprazole (PRILOSEC) 20 MG capsule TAKE 1 CAPSULE EVERY DAY 90 capsule 3  . ONE TOUCH LANCETS MISC Use to test blood sugar 2-3 times weekly dx: E11.49 100 each 3  .  simvastatin (ZOCOR) 40 MG tablet Take 1 tablet (40 mg total) by mouth at bedtime. 90 tablet 3   No current facility-administered medications on file prior to visit.    Allergies  Allergen Reactions  . Atorvastatin     REACTION: Joint aches    Past Medical History:  Diagnosis Date  . Carpal tunnel syndrome of right wrist   . Cataract   . ED (erectile dysfunction)   . GERD (gastroesophageal reflux disease)   . History of nephrolithiasis   . Hyperlipidemia   . Melanoma ([place])   . NIDDM, uncontrolled, with neuropathy   . Personal history of colonic polyps - 2 small adenomas 07/15/2013  . Renal disorder    kidney stones  . Seronegative rheumatoid arthritis ([place])   . Thyroid disease     Past Surgical History:  Procedure Laterality Date  . CATARACT EXTRACTION W/ INTRAOCULAR LENS IMPLANT Bilateral 11/14 & 2/15  . CHOLECYSTECTOMY    . KNEE ARTHROSCOPY    . MELANOMA EXCISION     upper right arm  . PILONIDAL CYST / SINUS EXCISION    . ROTATOR CUFF REPAIR    . TOE SURGERY      Family History  Problem Relation Age of Onset  . Heart disease Mother   . Diabetes Mother   . Heart disease Brother   . Cancer Neg Hx   . Colon cancer Neg Hx  Social History   Socioeconomic History  . Marital status: Married    Spouse name: Not on file  . Number of children: 2  . Years of education: Not on file  . Highest education level: Not on file  Occupational History  . Occupation: retired Geneticist, molecular  Tobacco Use  . Smoking status: Never Smoker  . Smokeless tobacco: Never Used  Substance and Sexual Activity  . Alcohol use: Yes    Comment: 1 glass wine monthly per pt.  . Drug use: No  . Sexual activity: Not on file  Other Topics Concern  . Not on file  Social History Narrative   Has living will    Wife, then son Ameir, would be health care POA   Would accept resuscitation. No prolonged artificial ventilation.   Not sure about tube feeds   Social Determinants of Health    Financial Resource Strain: Not on file  Food Insecurity: Not on file  Transportation Needs: Not on file  Physical Activity: Not on file  Stress: Not on file  Social Connections: Not on file  Intimate Partner Violence: Not on file   Review of Systems No focal weakness No facial droop No trouble speaking or swallowing Poor hearing---but not necessarily worse No ringing    Objective:   Physical Exam Constitutional:      Appearance: Normal appearance.  Neurological:     General: No focal deficit present.     Mental Status: He is alert.     Comments: Very mild ataxia            Assessment & Plan:

## 2020-02-14 ENCOUNTER — Telehealth: Payer: Self-pay | Admitting: Internal Medicine

## 2020-02-14 NOTE — Telephone Encounter (Signed)
I did virtual neurology consult on [place]. The neurologist felt that the symptoms were almost certainly part of his diabetic neuropathy. Agreed that MRI would be best --but CT of head still would be worth something Also suggested carotids (seems low yield to me) and other labs for neuropathy (that I have done other than Lyme, protein electrophoresis, vitamin D and hep C)  Discussed with patient He wonders how much the extra testing would cost I recommended that he try walking with a cane--if the symptoms are diabetic neuropathy related, this should help. Pursue the ENT evaluation. Will go ahead with the additional testing if things aren't improving

## 2020-03-02 ENCOUNTER — Telehealth: Payer: Self-pay | Admitting: Internal Medicine

## 2020-03-02 NOTE — Telephone Encounter (Signed)
Working on this

## 2020-03-02 NOTE — Telephone Encounter (Signed)
Spoke with patient about options. Scheduled patient with [facility] instead on 03/18/20. Patient aware of all the details for his appointment.

## 2020-03-02 NOTE — Telephone Encounter (Signed)
Pt called in the doctor that he was referred to doesn't have any openings until May and wanted to know if can get something quickly due to he is has been having the issue of dizziness

## 2020-03-18 DIAGNOSIS — H903 Sensorineural hearing loss, bilateral: Secondary | ICD-10-CM | POA: Diagnosis not present

## 2020-03-18 DIAGNOSIS — J342 Deviated nasal septum: Secondary | ICD-10-CM | POA: Diagnosis not present

## 2020-03-18 DIAGNOSIS — H8111 Benign paroxysmal vertigo, right ear: Secondary | ICD-10-CM | POA: Diagnosis not present

## 2020-04-13 ENCOUNTER — Ambulatory Visit (INDEPENDENT_AMBULATORY_CARE_PROVIDER_SITE_OTHER): Payer: Medicare HMO | Admitting: Internal Medicine

## 2020-04-13 ENCOUNTER — Encounter: Payer: Self-pay | Admitting: Internal Medicine

## 2020-04-13 ENCOUNTER — Other Ambulatory Visit: Payer: Self-pay

## 2020-04-13 VITALS — BP 98/62 | HR 90 | Temp 97.8°F | Ht 67.0 in | Wt 176.0 lb

## 2020-04-13 DIAGNOSIS — G3184 Mild cognitive impairment, so stated: Secondary | ICD-10-CM

## 2020-04-13 DIAGNOSIS — E1149 Type 2 diabetes mellitus with other diabetic neurological complication: Secondary | ICD-10-CM

## 2020-04-13 DIAGNOSIS — I10 Essential (primary) hypertension: Secondary | ICD-10-CM

## 2020-04-13 LAB — POCT GLYCOSYLATED HEMOGLOBIN (HGB A1C): Hemoglobin A1C: 7.2 % — AB (ref 4.0–5.6)

## 2020-04-13 NOTE — Progress Notes (Signed)
Subjective:    Patient ID: Sean Koch, male    DOB: 12/28/1940, 80 y.o.   MRN: [id number]  HPI Here for follow up of memory issues and diabetes With wife This visit occurred during the SARS-CoV-2 public health emergency.  Safety protocols were in place, including screening questions prior to the visit, additional usage of staff PPE, and extensive cleaning of exam room while observing appropriate contact time as indicated for disinfecting solutions.   He feels his memory is okay Not getting lost Did stop computer about 2 years ago Mostly just watches TV now Not exercising Did see ENT PA--scheduled for PT due to vestibular symptoms  Not checking sugars much--almost never He feels fine No sig tingling or pain in feet  Current Outpatient Medications on File Prior to Visit  Medication Sig Dispense Refill  . aspirin EC 81 MG tablet Take 81 mg by mouth daily.    . Blood Glucose Monitoring Suppl (ONE TOUCH ULTRA SYSTEM KIT) w/Device KIT Use to test blood sugar once daily DX: E11.41 1 each 0  . Calcium Carb-Cholecalciferol 600-800 MG-UNIT TABS Take 2 tablets by mouth daily.     Marland Kitchen glipiZIDE (GLUCOTROL) 10 MG tablet TAKE 1 TABLET TWICE DAILY BEFORE MEALS 180 tablet 3  . glucose blood (ONE TOUCH ULTRA TEST) test strip Use as instructed, to test 2-3 times weekly dx: E11.49 100 each 3  . JANUVIA 100 MG tablet TAKE 1 TABLET EVERY DAY 90 tablet 3  . levothyroxine (SYNTHROID) 75 MCG tablet TAKE 1 TABLET EVERY DAY 90 tablet 3  . metFORMIN (GLUCOPHAGE) 500 MG tablet TAKE 2 TABLETS TWICE DAILY WITH MEALS 360 tablet 3  . omeprazole (PRILOSEC) 20 MG capsule TAKE 1 CAPSULE EVERY DAY 90 capsule 3  . ONE TOUCH LANCETS MISC Use to test blood sugar 2-3 times weekly dx: E11.49 100 each 3  . simvastatin (ZOCOR) 40 MG tablet Take 1 tablet (40 mg total) by mouth at bedtime. 90 tablet 3   No current facility-administered medications on file prior to visit.    Allergies  Allergen Reactions  .  Atorvastatin     REACTION: Joint aches    Past Medical History:  Diagnosis Date  . Carpal tunnel syndrome of right wrist   . Cataract   . ED (erectile dysfunction)   . GERD (gastroesophageal reflux disease)   . History of nephrolithiasis   . Hyperlipidemia   . Melanoma ([place])   . NIDDM, uncontrolled, with neuropathy   . Personal history of colonic polyps - 2 small adenomas 07/15/2013  . Renal disorder    kidney stones  . Seronegative rheumatoid arthritis ([place])   . Thyroid disease     Past Surgical History:  Procedure Laterality Date  . CATARACT EXTRACTION W/ INTRAOCULAR LENS IMPLANT Bilateral 11/14 & 2/15  . CHOLECYSTECTOMY    . KNEE ARTHROSCOPY    . MELANOMA EXCISION     upper right arm  . PILONIDAL CYST / SINUS EXCISION    . ROTATOR CUFF REPAIR    . TOE SURGERY      Family History  Problem Relation Age of Onset  . Heart disease Mother   . Diabetes Mother   . Heart disease Brother   . Cancer Neg Hx   . Colon cancer Neg Hx     Social History   Socioeconomic History  . Marital status: Married    Spouse name: Not on file  . Number of children: 2  . Years of education: Not on file  .  Highest education level: Not on file  Occupational History  . Occupation: retired Geneticist, molecular  Tobacco Use  . Smoking status: Never Smoker  . Smokeless tobacco: Never Used  Substance and Sexual Activity  . Alcohol use: Yes    Comment: 1 glass wine monthly per pt.  . Drug use: No  . Sexual activity: Not on file  Other Topics Concern  . Not on file  Social History Narrative   Has living will    Wife, then son Mordecai, would be health care POA   Would accept resuscitation. No prolonged artificial ventilation.   Not sure about tube feeds   Social Determinants of Health   Financial Resource Strain: Not on file  Food Insecurity: Not on file  Transportation Needs: Not on file  Physical Activity: Not on file  Stress: Not on file  Social Connections: Not on file  Intimate Partner  Violence: Not on file   Review of Systems  Eating okay Weight stable Sleeps well--but takes a while to initiate Not depressed No chest pain or SOB Some urgency--but generally continent Ongoing balance issues--walks with cane     Objective:   Physical Exam Constitutional:      Appearance: Normal appearance.  Cardiovascular:     Rate and Rhythm: Normal rate and regular rhythm.     Pulses: Normal pulses.     Heart sounds: No murmur heard. No gallop.   Pulmonary:     Effort: Pulmonary effort is normal.     Breath sounds: Normal breath sounds. No wheezing or rales.  Musculoskeletal:     Cervical back: Neck supple.     Right lower leg: No edema.     Left lower leg: No edema.  Lymphadenopathy:     Cervical: No cervical adenopathy.  Neurological:     Mental Status: He is alert.  Psychiatric:        Mood and Affect: Mood normal.        Behavior: Behavior normal.            Assessment & Plan:

## 2020-04-13 NOTE — Assessment & Plan Note (Signed)
BP Readings from Last 3 Encounters:  04/13/20 98/62  02/12/20 106/68  01/22/20 98/60   Okay without meds now

## 2020-04-13 NOTE — Assessment & Plan Note (Signed)
Discussed social interaction, mental stimulation and exercise He doesn't do any of this No progression per wife---will hold off on MRI, neurology for now Is on ASA and statin

## 2020-04-13 NOTE — Assessment & Plan Note (Addendum)
Hasn't been checking at home Urged him to check at least a couple of times a week Has balance issues (vestibular and neuropathy)---urged him to use cane Will check A1c  Lab Results  Component Value Date   HGBA1C 7.2 (A) 04/13/2020   Control is fine on glipizide, januvia and metformin

## 2020-04-23 DIAGNOSIS — H8113 Benign paroxysmal vertigo, bilateral: Secondary | ICD-10-CM | POA: Diagnosis not present

## 2020-04-30 DIAGNOSIS — H8113 Benign paroxysmal vertigo, bilateral: Secondary | ICD-10-CM | POA: Diagnosis not present

## 2020-05-11 ENCOUNTER — Ambulatory Visit (INDEPENDENT_AMBULATORY_CARE_PROVIDER_SITE_OTHER): Payer: Medicare HMO | Admitting: Otolaryngology

## 2020-05-11 DIAGNOSIS — H8111 Benign paroxysmal vertigo, right ear: Secondary | ICD-10-CM | POA: Diagnosis not present

## 2020-07-29 ENCOUNTER — Other Ambulatory Visit: Payer: Self-pay | Admitting: Internal Medicine

## 2020-08-25 ENCOUNTER — Ambulatory Visit: Payer: Medicare HMO | Admitting: Internal Medicine

## 2020-10-15 ENCOUNTER — Encounter: Payer: Self-pay | Admitting: Internal Medicine

## 2020-10-15 ENCOUNTER — Ambulatory Visit (INDEPENDENT_AMBULATORY_CARE_PROVIDER_SITE_OTHER): Payer: Medicare HMO | Admitting: Internal Medicine

## 2020-10-15 ENCOUNTER — Other Ambulatory Visit: Payer: Self-pay

## 2020-10-15 VITALS — BP 122/74 | HR 90 | Temp 97.3°F | Ht 66.5 in | Wt 171.0 lb

## 2020-10-15 DIAGNOSIS — I10 Essential (primary) hypertension: Secondary | ICD-10-CM

## 2020-10-15 DIAGNOSIS — G3184 Mild cognitive impairment, so stated: Secondary | ICD-10-CM

## 2020-10-15 DIAGNOSIS — Z23 Encounter for immunization: Secondary | ICD-10-CM

## 2020-10-15 DIAGNOSIS — Z0000 Encounter for general adult medical examination without abnormal findings: Secondary | ICD-10-CM | POA: Diagnosis not present

## 2020-10-15 DIAGNOSIS — E039 Hypothyroidism, unspecified: Secondary | ICD-10-CM | POA: Diagnosis not present

## 2020-10-15 DIAGNOSIS — I700 Atherosclerosis of aorta: Secondary | ICD-10-CM | POA: Diagnosis not present

## 2020-10-15 DIAGNOSIS — E1149 Type 2 diabetes mellitus with other diabetic neurological complication: Secondary | ICD-10-CM

## 2020-10-15 LAB — HM DIABETES FOOT EXAM

## 2020-10-15 NOTE — Assessment & Plan Note (Signed)
Seems to be euthryoid

## 2020-10-15 NOTE — Assessment & Plan Note (Signed)
BP Readings from Last 3 Encounters:  10/15/20 122/74  04/13/20 98/62  02/12/20 106/68   Still okay without meds

## 2020-10-15 NOTE — Progress Notes (Signed)
Subjective:    Patient ID: Sean Koch, male    DOB: 1940-08-14, 80 y.o.   MRN: [id number]  HPI Here with wife for Medicare wellness visit and follow up of chronic health conditions This visit occurred during the SARS-CoV-2 public health emergency.  Safety protocols were in place, including screening questions prior to the visit, additional usage of staff PPE, and extensive cleaning of exam room while observing appropriate contact time as indicated for disinfecting solutions.   Reviewed advanced directives Reviewed other doctors--Dr Spainhour--ENT, Dr Era Bumpers, Leroux-Martinex--audiologist, dentist? No alcohol or tobacco Still not exercising No hospitalizations or surgery Vision is okay generally--overdue for eye exam Hearing is okay Has had several falls---minor injuries. Related to the vertigo No depression or anhedonia Can do instrumental ADLs Ongoing memory issues  Got sharp pain in left elbow (epicondyle area) No injury---hasn't recurred ~2 weeks ago Not exertional No chest pain or SOB  Still concerned about memory---worse per him and wife Did get lost in car once--not a real common spot No functional changes--though wife does most of the housework He still does lawn work---rider/weed Risk manager, etc  Still gets dizzy---vertigo Has seen ENT---vestibular repositioning did not help Affects his ability to walk for distance  Not checking sugars much---not since last visit Feet are pretty good----occasional pain if sits around  No headaches No syncope No edema  Known CKD 3---GFR 54 Known aortic atherosclerosis on imaging  Continues on levothyroxine  Continues on omeprazole No heartburn or dysphagia  Current Outpatient Medications on File Prior to Visit  Medication Sig Dispense Refill   aspirin EC 81 MG tablet Take 81 mg by mouth daily.     Blood Glucose Monitoring Suppl (ONE TOUCH ULTRA SYSTEM KIT) w/Device KIT Use to test blood sugar once daily  DX: E11.41 1 each 0   Calcium Carb-Cholecalciferol 600-800 MG-UNIT TABS Take 2 tablets by mouth daily.      glipiZIDE (GLUCOTROL) 10 MG tablet TAKE 1 TABLET TWICE DAILY BEFORE MEALS 180 tablet 3   glucose blood (ONE TOUCH ULTRA TEST) test strip Use as instructed, to test 2-3 times weekly dx: E11.49 100 each 3   JANUVIA 100 MG tablet TAKE 1 TABLET EVERY DAY 90 tablet 3   levothyroxine (SYNTHROID) 75 MCG tablet TAKE 1 TABLET EVERY DAY 90 tablet 3   metFORMIN (GLUCOPHAGE) 500 MG tablet TAKE 2 TABLETS TWICE DAILY WITH MEALS 360 tablet 3   omeprazole (PRILOSEC) 20 MG capsule TAKE 1 CAPSULE EVERY DAY 90 capsule 3   ONE TOUCH LANCETS MISC Use to test blood sugar 2-3 times weekly dx: E11.49 100 each 3   simvastatin (ZOCOR) 40 MG tablet TAKE 1 TABLET (40 MG TOTAL) BY MOUTH AT BEDTIME. 90 tablet 3   No current facility-administered medications on file prior to visit.    Allergies  Allergen Reactions   Atorvastatin     REACTION: Joint aches    Past Medical History:  Diagnosis Date   Carpal tunnel syndrome of right wrist    Cataract    ED (erectile dysfunction)    GERD (gastroesophageal reflux disease)    History of nephrolithiasis    Hyperlipidemia    Melanoma ([facility])    NIDDM, uncontrolled, with neuropathy    Personal history of colonic polyps - 2 small adenomas 07/15/2013   Renal disorder    kidney stones   Seronegative rheumatoid arthritis ([place])    Thyroid disease     Past Surgical History:  Procedure Laterality Date   CATARACT EXTRACTION W/ INTRAOCULAR LENS  IMPLANT Bilateral 11/14 & 2/15   CHOLECYSTECTOMY     KNEE ARTHROSCOPY     MELANOMA EXCISION     upper right arm   PILONIDAL CYST / SINUS EXCISION     ROTATOR CUFF REPAIR     TOE SURGERY      Family History  Problem Relation Age of Onset   Heart disease Mother    Diabetes Mother    Heart disease Brother    Cancer Neg Hx    Colon cancer Neg Hx     Social History   Socioeconomic History   Marital status: Married     Spouse name: Not on file   Number of children: 2   Years of education: Not on file   Highest education level: Not on file  Occupational History   Occupation: retired Geneticist, molecular  Tobacco Use   Smoking status: Never   Smokeless tobacco: Never  Substance and Sexual Activity   Alcohol use: Yes    Comment: 1 glass wine monthly per pt.   Drug use: No   Sexual activity: Not on file  Other Topics Concern   Not on file  Social History Narrative   Has living will    Wife, then son Kaliq, would be health care POA   Would accept resuscitation. No prolonged artificial ventilation.   Not sure about tube feeds   Social Determinants of Health   Financial Resource Strain: Not on file  Food Insecurity: Not on file  Transportation Needs: Not on file  Physical Activity: Not on file  Stress: Not on file  Social Connections: Not on file  Intimate Partner Violence: Not on file   Review of Systems Appetite is fine Weight is stable Sleeps okay Teeth okay---sees dentist Wears seat belt Bowels usually okay---some watery stool recently (from too many nuts?). No blood Urine flow is okay---nocturia x 1. Will have occasional urge incontinence No sig back or joint pains    Objective:   Physical Exam Constitutional:      Appearance: Normal appearance.  HENT:     Mouth/Throat:     Comments: No lesions Eyes:     Conjunctiva/sclera: Conjunctivae normal.     Pupils: Pupils are equal, round, and reactive to light.  Cardiovascular:     Rate and Rhythm: Normal rate and regular rhythm.     Pulses: Normal pulses.     Heart sounds: No murmur heard.   No gallop.  Pulmonary:     Effort: Pulmonary effort is normal.     Breath sounds: Normal breath sounds. No wheezing or rales.  Abdominal:     Palpations: Abdomen is soft.     Tenderness: There is no abdominal tenderness.  Musculoskeletal:     Cervical back: Neck supple.     Right lower leg: No edema.     Left lower leg: No edema.   Lymphadenopathy:     Cervical: No cervical adenopathy.  Skin:    Findings: No rash.     Comments: No foot lesions  Neurological:     Mental Status: He is alert and oriented to person, place, and time.     Comments: President---"Biden, Trump, ?" [id number] Recall 2/3  Mild sensory change in feet (hyperaesthesia)  Psychiatric:        Mood and Affect: Mood normal.        Behavior: Behavior normal.           Assessment & Plan:

## 2020-10-15 NOTE — Assessment & Plan Note (Signed)
Not checking but hopefully still okay on januvia, glipizide and metformin Very mild neuropathy

## 2020-10-15 NOTE — Addendum Note (Signed)
Addended by: Pilar Grammes on: 10/15/2020 04:48 PM   Modules accepted: Orders

## 2020-10-15 NOTE — Assessment & Plan Note (Addendum)
Memory loss is slightly progressive Discussed exercise, social engagement, mental stimulation Will check some labs  Next steps would be brain MRI and neurology

## 2020-10-15 NOTE — Assessment & Plan Note (Signed)
I have personally reviewed the Medicare Annual Wellness questionnaire and have noted 1. The patient's medical and social history 2. Their use of alcohol, tobacco or illicit drugs 3. Their current medications and supplements 4. The patient's functional ability including ADL's, fall risks, home safety risks and hearing or visual             impairment. 5. Diet and physical activities 6. Evidence for depression or mood disorders  The patients weight, height, BMI and visual acuity have been recorded in the chart I have made referrals, counseling and provided education to the patient based review of the above and I have provided the pt with a written personalized care plan for preventive services.  I have provided you with a copy of your personalized plan for preventive services. Please take the time to review along with your updated medication list.  Done with cancer screening Discussed exercise Flu vaccine today Bivalent COVID soon shingrix when covered

## 2020-10-15 NOTE — Assessment & Plan Note (Signed)
On imaging Is on statin 

## 2020-10-16 LAB — RENAL FUNCTION PANEL
Albumin: 4.4 g/dL (ref 3.5–5.2)
BUN: 20 mg/dL (ref 6–23)
CO2: 27 mEq/L (ref 19–32)
Calcium: 10 mg/dL (ref 8.4–10.5)
Chloride: 103 mEq/L (ref 96–112)
Creatinine, Ser: 1.33 mg/dL (ref 0.40–1.50)
GFR: 49.77 mL/min — ABNORMAL LOW (ref >60.00)
Glucose, Bld: 179 mg/dL — ABNORMAL HIGH (ref 70–99)
Phosphorus: 3.7 mg/dL (ref 2.3–4.6)
Potassium: 4.6 mEq/L (ref 3.5–5.1)
Sodium: 140 mEq/L (ref 135–145)

## 2020-10-16 LAB — VITAMIN B12: Vitamin B-12: 522 pg/mL (ref 211–911)

## 2020-10-16 LAB — CBC
HCT: 46 % (ref 39.0–52.0)
Hemoglobin: 15.1 g/dL (ref 13.0–17.0)
MCHC: 32.8 g/dL (ref 30.0–36.0)
MCV: 92.7 fl (ref 78.0–100.0)
Platelets: 197 10*3/uL (ref 150.0–400.0)
RBC: 4.97 Mil/uL (ref 4.22–5.81)
RDW: 13.8 % (ref 11.5–15.5)
WBC: 8.5 10*3/uL (ref 4.0–10.5)

## 2020-10-16 LAB — HEPATIC FUNCTION PANEL
ALT: 7 U/L (ref 0–53)
AST: 14 U/L (ref 0–37)
Albumin: 4.4 g/dL (ref 3.5–5.2)
Alkaline Phosphatase: 73 U/L (ref 39–117)
Bilirubin, Direct: 0.1 mg/dL (ref 0.0–0.3)
Total Bilirubin: 0.7 mg/dL (ref 0.2–1.2)
Total Protein: 7 g/dL (ref 6.0–8.3)

## 2020-10-16 LAB — MICROALBUMIN / CREATININE URINE RATIO
Creatinine,U: 106.5 mg/dL
Microalb Creat Ratio: 1.9 mg/g (ref 0.0–30.0)
Microalb, Ur: 2 mg/dL — ABNORMAL HIGH (ref 0.0–1.9)

## 2020-10-16 LAB — TSH: TSH: 5.03 u[IU]/mL (ref 0.35–5.50)

## 2020-10-16 LAB — T4, FREE: Free T4: 0.95 ng/dL (ref 0.60–1.60)

## 2020-11-05 ENCOUNTER — Other Ambulatory Visit: Payer: Self-pay | Admitting: Internal Medicine

## 2020-11-09 DIAGNOSIS — L814 Other melanin hyperpigmentation: Secondary | ICD-10-CM | POA: Diagnosis not present

## 2020-11-09 DIAGNOSIS — L821 Other seborrheic keratosis: Secondary | ICD-10-CM | POA: Diagnosis not present

## 2020-11-09 DIAGNOSIS — D1801 Hemangioma of skin and subcutaneous tissue: Secondary | ICD-10-CM | POA: Diagnosis not present

## 2020-11-09 DIAGNOSIS — Z85828 Personal history of other malignant neoplasm of skin: Secondary | ICD-10-CM | POA: Diagnosis not present

## 2020-11-18 ENCOUNTER — Other Ambulatory Visit: Payer: Self-pay | Admitting: Internal Medicine

## 2021-02-04 ENCOUNTER — Telehealth: Payer: Self-pay | Admitting: Internal Medicine

## 2021-02-04 NOTE — Telephone Encounter (Signed)
Pt came in office requesting to have Parking placard  form be filled out . It was place in mail box

## 2021-02-05 NOTE — Telephone Encounter (Signed)
Spoke to pt. Form up front ready for pickup 

## 2021-02-05 NOTE — Telephone Encounter (Signed)
Form placed in Dr Letvak's inbox on his desk 

## 2021-04-15 ENCOUNTER — Ambulatory Visit: Payer: Medicare HMO | Admitting: Internal Medicine

## 2021-04-20 ENCOUNTER — Ambulatory Visit: Payer: Medicare HMO | Admitting: Internal Medicine

## 2021-05-05 ENCOUNTER — Encounter: Payer: Self-pay | Admitting: Internal Medicine

## 2021-05-05 ENCOUNTER — Ambulatory Visit (INDEPENDENT_AMBULATORY_CARE_PROVIDER_SITE_OTHER): Payer: Medicare HMO | Admitting: Internal Medicine

## 2021-05-05 VITALS — BP 104/68 | HR 78 | Temp 97.9°F | Ht 66.5 in | Wt 172.0 lb

## 2021-05-05 DIAGNOSIS — I10 Essential (primary) hypertension: Secondary | ICD-10-CM

## 2021-05-05 DIAGNOSIS — N1831 Chronic kidney disease, stage 3a: Secondary | ICD-10-CM

## 2021-05-05 DIAGNOSIS — E1149 Type 2 diabetes mellitus with other diabetic neurological complication: Secondary | ICD-10-CM | POA: Diagnosis not present

## 2021-05-05 DIAGNOSIS — M0600 Rheumatoid arthritis without rheumatoid factor, unspecified site: Secondary | ICD-10-CM | POA: Diagnosis not present

## 2021-05-05 DIAGNOSIS — G3184 Mild cognitive impairment, so stated: Secondary | ICD-10-CM | POA: Diagnosis not present

## 2021-05-05 LAB — POCT GLYCOSYLATED HEMOGLOBIN (HGB A1C): Hemoglobin A1C: 7.2 % — AB (ref 4.0–5.6)

## 2021-05-05 MED ORDER — EMPAGLIFLOZIN 10 MG PO TABS: 10.0000 mg | ORAL_TABLET | Freq: Every day | ORAL | 3 refills | Status: DC

## 2021-05-05 NOTE — Assessment & Plan Note (Signed)
Lab Results  ?Component Value Date  ? HGBA1C 7.2 (A) 05/05/2021  ? ?Good control still---on metformin 1000 bid, glipizide 10 bid and januvia 100 daily ?Will change the [place] to jardiance 10 thoug ?

## 2021-05-05 NOTE — Assessment & Plan Note (Signed)
BP Readings from Last 3 Encounters:  ?05/05/21 104/68  ?10/15/20 122/74  ?04/13/20 98/62  ? ?Still fairly low ?Would consider ARB if goes up ?

## 2021-05-05 NOTE — Assessment & Plan Note (Signed)
Memory and function stable at this point ?Discussed exercise and social engagement ?

## 2021-05-05 NOTE — Assessment & Plan Note (Signed)
Will start jardiance (and stop [place]) ?

## 2021-05-05 NOTE — Patient Instructions (Signed)
When you get the new medication---jardiance '10mg'$ --in the mail, start this and stop the [place]. It may make you pee more. ?

## 2021-05-05 NOTE — Progress Notes (Signed)
? ?Subjective:  ? ? Patient ID: Sean Koch, male    DOB: 1940/02/03, 81 y.o.   MRN: [id number] ? ?HPI ?Here for follow up of diabetes and other chronic health issues ? ?Some sharp pains in left elbow and left foot ?Noticed for months---but only 1-2 times a month ?Occurs at rest---will go after 10-15 minutes ?No other joint issues ? ?Ongoing memory issues ?No sig change ? ?Checks sugars occasionally ?Does randomly---doesn't remember numbers but they are okay ?Some sig foot or hand burning ? ?No chest pain ?No SOB ?No dizziness or syncope ?Only exercise is yard work ? ?Last GFR 49 ? ?Current Outpatient Medications on File Prior to Visit  ?Medication Sig Dispense Refill  ? aspirin EC 81 MG tablet Take 81 mg by mouth daily.    ? Blood Glucose Monitoring Suppl (ONE TOUCH ULTRA SYSTEM KIT) w/Device KIT Use to test blood sugar once daily DX: E11.41 1 each 0  ? Calcium Carb-Cholecalciferol 600-800 MG-UNIT TABS Take 2 tablets by mouth daily.     ? glipiZIDE (GLUCOTROL) 10 MG tablet TAKE 1 TABLET TWICE DAILY BEFORE MEALS 180 tablet 3  ? JANUVIA 100 MG tablet TAKE 1 TABLET EVERY DAY 90 tablet 3  ? levothyroxine (SYNTHROID) 75 MCG tablet TAKE 1 TABLET EVERY DAY 90 tablet 3  ? metFORMIN (GLUCOPHAGE) 500 MG tablet TAKE 2 TABLETS TWICE DAILY WITH MEALS 360 tablet 3  ? omeprazole (PRILOSEC) 20 MG capsule TAKE 1 CAPSULE EVERY DAY 90 capsule 3  ? ONE TOUCH LANCETS MISC Use to test blood sugar 2-3 times weekly dx: E11.49 100 each 3  ? ONETOUCH ULTRA test strip TEST BLOOD SUGAR ONCE DAILY. 100 strip 4  ? simvastatin (ZOCOR) 40 MG tablet TAKE 1 TABLET (40 MG TOTAL) BY MOUTH AT BEDTIME. 90 tablet 3  ? ?No current facility-administered medications on file prior to visit.  ? ? ?Allergies  ?Allergen Reactions  ? Atorvastatin   ?  REACTION: Joint aches  ? ? ?Past Medical History:  ?Diagnosis Date  ? Carpal tunnel syndrome of right wrist   ? Cataract   ? ED (erectile dysfunction)   ? GERD (gastroesophageal reflux disease)   ? History of  nephrolithiasis   ? Hyperlipidemia   ? Melanoma ([place])   ? NIDDM, uncontrolled, with neuropathy   ? Personal history of colonic polyps - 2 small adenomas 07/15/2013  ? Renal disorder   ? kidney stones  ? Seronegative rheumatoid arthritis ([place])   ? Thyroid disease   ? ? ?Past Surgical History:  ?Procedure Laterality Date  ? CATARACT EXTRACTION W/ INTRAOCULAR LENS IMPLANT Bilateral 11/14 & 2/15  ? CHOLECYSTECTOMY    ? KNEE ARTHROSCOPY    ? MELANOMA EXCISION    ? upper right arm  ? PILONIDAL CYST / SINUS EXCISION    ? ROTATOR CUFF REPAIR    ? TOE SURGERY    ? ? ?Family History  ?Problem Relation Age of Onset  ? Heart disease Mother   ? Diabetes Mother   ? Heart disease Brother   ? Cancer Neg Hx   ? Colon cancer Neg Hx   ? ? ?Social History  ? ?Socioeconomic History  ? Marital status: Married  ?  Spouse name: Not on file  ? Number of children: 2  ? Years of education: Not on file  ? Highest education level: Not on file  ?Occupational History  ? Occupation: retired Geneticist, molecular  ?Tobacco Use  ? Smoking status: Never  ? Smokeless tobacco: Never  ?  Substance and Sexual Activity  ? Alcohol use: Yes  ?  Comment: 1 glass wine monthly per pt.  ? Drug use: No  ? Sexual activity: Not on file  ?Other Topics Concern  ? Not on file  ?Social History Narrative  ? Has living will   ? Wife, then son Elan, would be health care POA  ? Would accept resuscitation. No prolonged artificial ventilation.  ? Not sure about tube feeds  ? ?Social Determinants of Health  ? ?Financial Resource Strain: Not on file  ?Food Insecurity: Not on file  ?Transportation Needs: Not on file  ?Physical Activity: Not on file  ?Stress: Not on file  ?Social Connections: Not on file  ?Intimate Partner Violence: Not on file  ? ?Review of Systems ?Appetite is okay ?Weight stable ?Sleeps okay---but trouble initiating ? ?   ?Objective:  ? Physical Exam ?Constitutional:   ?   Appearance: Normal appearance.  ?Cardiovascular:  ?   Rate and Rhythm: Normal rate and regular  rhythm.  ?   Heart sounds: No murmur heard. ?  No gallop.  ?   Comments: Faint pedal pulses ?Pulmonary:  ?   Effort: Pulmonary effort is normal.  ?   Breath sounds: Normal breath sounds. No wheezing or rales.  ?Musculoskeletal:  ?   Cervical back: Neck supple.  ?   Right lower leg: No edema.  ?   Left lower leg: No edema.  ?   Comments: No findings in left elbow or ankle  ?Lymphadenopathy:  ?   Cervical: No cervical adenopathy.  ?Skin: ?   Comments: No foot lesions  ?Neurological:  ?   Mental Status: He is alert.  ?Psychiatric:     ?   Mood and Affect: Mood normal.     ?   Behavior: Behavior normal.  ?  ? ? ? ? ?   ?Assessment & Plan:  ? ?

## 2021-07-09 DIAGNOSIS — H02886 Meibomian gland dysfunction of left eye, unspecified eyelid: Secondary | ICD-10-CM | POA: Diagnosis not present

## 2021-07-09 DIAGNOSIS — H04123 Dry eye syndrome of bilateral lacrimal glands: Secondary | ICD-10-CM | POA: Diagnosis not present

## 2021-08-02 ENCOUNTER — Telehealth: Payer: Self-pay | Admitting: Internal Medicine

## 2021-08-02 NOTE — Telephone Encounter (Signed)
Patient called in and was looking for recommendations for Orthopedic surgeons. He stated you can give him a call at [phone]. Thank you!

## 2021-08-04 NOTE — Telephone Encounter (Signed)
Pt is having left knee pain. I made him an appt with Dr Lorelei Pont for tomorrow. Advised he could tell the pt if he needs to see ortho or not.

## 2021-08-05 ENCOUNTER — Ambulatory Visit (INDEPENDENT_AMBULATORY_CARE_PROVIDER_SITE_OTHER)
Admission: RE | Admit: 2021-08-05 | Discharge: 2021-08-05 | Disposition: A | Discharge status: Home / Self Care | Payer: Medicare HMO | Source: Ambulatory Visit | Attending: Family Medicine | Admitting: Family Medicine

## 2021-08-05 ENCOUNTER — Ambulatory Visit (INDEPENDENT_AMBULATORY_CARE_PROVIDER_SITE_OTHER): Payer: Medicare HMO | Admitting: Family Medicine

## 2021-08-05 ENCOUNTER — Encounter: Payer: Self-pay | Admitting: Family Medicine

## 2021-08-05 VITALS — BP 104/60 | HR 73 | Temp 98.1°F | Ht 66.5 in | Wt 170.1 lb

## 2021-08-05 DIAGNOSIS — M170 Bilateral primary osteoarthritis of knee: Secondary | ICD-10-CM

## 2021-08-05 DIAGNOSIS — M1712 Unilateral primary osteoarthritis, left knee: Secondary | ICD-10-CM

## 2021-08-05 DIAGNOSIS — M1711 Unilateral primary osteoarthritis, right knee: Secondary | ICD-10-CM | POA: Diagnosis not present

## 2021-08-05 NOTE — Progress Notes (Signed)
Sean Guia T. Takota Cahalan, MD, [place] at [facility] [place], [address]  Phone: [phone]  FAX: [phone]  Sean Koch - 81 y.o. male  MRN [id number]  Date of Birth: 07/15/1940  Date: 08/05/2021  PCP: Venia Carbon, MD  Referral: Venia Carbon, MD  Chief Complaint  Patient presents with   Knee Pain    Bilateral but left is worse   Subjective:   Sean Koch is a 81 y.o. very pleasant male patient with Body mass index is 27.05 kg/m. who presents with the following:  Presents with ongoing right-sided knee pain with a known history of significant osteoarthritis of the right knee.  Most recent x-rays were approaching 5 years ago, and that did show some moderate osteoarthritis.  He also has advanced osteoarthritis of the left knee.  After discussion, he is not really having any pain in his knees.  He is going up and down stairs 1 leg at a time, and he is interested in knowing why this would be the case.  He also is interested in knowing how he could go up and down stairs in a pattern where he goes up and steps "normally."  On his weightbearing knee films on the left side the patient has end-stage degenerative joint disease, tricompartmental.  In the medial compartment there is essentially no joint space, the femoral condyle is flattened and there is subchondral sclerosis.  He has extensive osteophytosis throughout the left knee.  On the right film, the patient has moderate to advanced osteoarthritis, worse in the medial compartment.  He does not like to take medication in general for his joints.  He also appears he does have chronic kidney disease stage III.  Has an exercise bike and a treadmill at home.    Review of Systems is noted in the HPI, as appropriate  Objective:   BP 104/60   Pulse 73   Temp 98.1 F (36.7 C) (Oral)   Ht 5' 6.5" (1.689 m)   Wt 170 lb 2 oz (77.2 kg)   SpO2 96%    BMI 27.05 kg/m   GEN: No acute distress; alert,appropriate. PULM: Breathing comfortably in no respiratory distress PSYCH: Normally interactive.   Bilateral knee exam: He lacks 4 degrees of extension on the right and he lacks 7 degrees of extension on the left Flexion to 100 on the left and 107 on the right. Stable to varus and valgus stress bilaterally.  Lachman and drawer testing is negative bilaterally. He does have some pain with forced extension and flexion.  Laboratory and Imaging Data:  Assessment and Plan:     ICD-10-CM   1. Primary osteoarthritis of left knee  M17.12     2. Primary osteoarthritis of right knee  M17.11 DG Knee 4 Views W/Patella Left     Because of altered mechanics is certainly advanced osteoarthritic changes of the knees.  Left knee is clearly bone-on-bone pathology.  Right now, he is not having any significant pain, so I do not think any form of intervention would be the best course of action right now.  He does have exercise equipment at home including an exercise bicycle and treadmill, so I encouraged him to be active and try to use his quad and hip strength.  Think that if he did this then he would likely walk and move more normally.  Tylenol prn pain  Orders placed today for conditions managed today: Orders Placed This Encounter  Procedures   DG Knee 4 Views W/Patella Left    Follow-up if needed: No follow-ups on file.  [employer] speech-to-text software was used for transcription in this dictation.  Possible transcriptional errors can occur using Editor, commissioning.   Signed,  Maud Deed. Garland Smouse, MD   Outpatient Encounter Medications as of 08/05/2021  Medication Sig   aspirin EC 81 MG tablet Take 81 mg by mouth daily.   Blood Glucose Monitoring Suppl (ONE TOUCH ULTRA SYSTEM KIT) w/Device KIT Use to test blood sugar once daily DX: E11.41   Calcium Carb-Cholecalciferol 600-800 MG-UNIT TABS Take 2 tablets by mouth daily.     empagliflozin (JARDIANCE) 10 MG TABS tablet Take 1 tablet (10 mg total) by mouth daily before breakfast.   glipiZIDE (GLUCOTROL) 10 MG tablet TAKE 1 TABLET TWICE DAILY BEFORE MEALS   levothyroxine (SYNTHROID) 75 MCG tablet TAKE 1 TABLET EVERY DAY   metFORMIN (GLUCOPHAGE) 500 MG tablet TAKE 2 TABLETS TWICE DAILY WITH MEALS   omeprazole (PRILOSEC) 20 MG capsule TAKE 1 CAPSULE EVERY DAY   ONE TOUCH LANCETS MISC Use to test blood sugar 2-3 times weekly dx: E11.49   ONETOUCH ULTRA test strip TEST BLOOD SUGAR ONCE DAILY.   simvastatin (ZOCOR) 40 MG tablet TAKE 1 TABLET (40 MG TOTAL) BY MOUTH AT BEDTIME.   No facility-administered encounter medications on file as of 08/05/2021.

## 2021-09-16 ENCOUNTER — Telehealth: Payer: Self-pay | Admitting: Internal Medicine

## 2021-09-16 NOTE — Telephone Encounter (Signed)
Spoke to pt. He was asking if he should get the new Covid vaccine coming out this month. I advised that it would be good for him to get that.

## 2021-09-16 NOTE — Telephone Encounter (Signed)
Patient called in and was wanting information regarding his immunizations. He stated he would like for you to call him back as soon as you can. Thank you!

## 2021-11-09 ENCOUNTER — Telehealth: Payer: Self-pay | Admitting: Internal Medicine

## 2021-11-09 NOTE — Telephone Encounter (Signed)
Left message on cell that we only show the 1st 2 covid vaccines

## 2021-11-09 NOTE — Telephone Encounter (Signed)
Patient called and stated that he would like information on the last covid shot because he is getting one at 4:00 oclock today.

## 2022-01-07 ENCOUNTER — Other Ambulatory Visit: Payer: Self-pay | Admitting: Internal Medicine

## 2022-01-11 NOTE — Telephone Encounter (Signed)
Patient scheduled.

## 2022-01-11 NOTE — Telephone Encounter (Signed)
Needs Medicare Wellness Exam. Thanks

## 2022-01-30 NOTE — Progress Notes (Signed)
Sean Koch T. Marcy Bogosian, MD, [place] at [facility] [place], [address]  Phone: [phone]  FAX: [phone]  Sean Koch - 82 y.o. male  MRN [id number]  Date of Birth: July 18, 1940  Date: 01/31/2022  PCP: Venia Carbon, MD  Referral: Venia Carbon, MD  Chief Complaint  Patient presents with   Foot Pain    Left   Sciatica    Left Buttock pain that radiates down leg   Subjective:   Sean Koch is a 82 y.o. very pleasant male patient with Body mass index is 25.66 kg/m. who presents with the following:  The patient presents with ongoing foot pain and left buttocks pain.  His pain has been variable and intermittent.  Right now his foot is not bothering him at all.  Of note, the patient did get lost driving here.  Hurting in the l buttocks area.  He denies any back pain.  Pain is localized to the buttocks and he has no radiation, numbness, tingling, or weakness.  He does not have any lateral hip pain.  He denies groin pain.  He denies knee pain.  He has no pain in the thigh, distal lower extremity, and right now he is foot is asymptomatic.  Review of Systems is noted in the HPI, as appropriate  Objective:   BP 100/60   Pulse (!) 102   Temp 97.8 F (36.6 C) (Oral)   Ht 5' 6.5" (1.689 m)   Wt 161 lb 6 oz (73.2 kg)   SpO2 96%   BMI 25.66 kg/m   GEN: No acute distress; alert,appropriate. PULM: Breathing comfortably in no respiratory distress PSYCH: Normally interactive.   Full range of motion at the hip.  No lateral tenderness/no trochanteric bursal tenderness.  With the hip flexed to 90 degrees he has no pain with terminal internal range of motion or external range of motion. Straight leg raise is negative He is neurovascularly intact throughout the entirety of both lower extremities. He does have some mild tenderness in the left posterior buttocks region inferiorly to palpation.  Low back  is nontender throughout its entirety.  Left foot and ankle is nontender throughout.  Laboratory and Imaging Data:  Assessment and Plan:     ICD-10-CM   1. Left hip pain  M25.552     2. Acute foot pain, left  M79.672      I do not think that this is true intra-articular hip pathology.  His back is fairly benign today on exam to, but he may have some referred left-sided buttocks pain from the lowest part of the spine.  Overall, his exam is reassuring.  His foot is entirely nontender right now.  Good range of motion at the ankle and foot.  There is some obvious degenerative changes at the MTP's.  Since this is intermittent, I am going to give him some Voltaren to use sparingly when he is having a flareup.  Medication Management during today's office visit: Meds ordered this encounter  Medications   diclofenac (VOLTAREN) 75 MG EC tablet    Sig: Take 1 tablet (75 mg total) by mouth 2 (two) times daily.    Dispense:  60 tablet    Refill:  0   There are no discontinued medications.  Orders placed today for conditions managed today: No orders of the defined types were placed in this encounter.   Disposition: No follow-ups on file.  [place]  was used for transcription in this dictation.  Possible transcriptional errors can occur using Editor, commissioning.   Signed,  Maud Deed. [name], MD   Outpatient Encounter Medications as of 01/31/2022  Medication Sig   aspirin EC 81 MG tablet Take 81 mg by mouth daily.   Blood Glucose Monitoring Suppl (ONE TOUCH ULTRA SYSTEM KIT) w/Device KIT Use to test blood sugar once daily DX: E11.41   Calcium Carb-Cholecalciferol 600-800 MG-UNIT TABS Take 2 tablets by mouth daily.    diclofenac (VOLTAREN) 75 MG EC tablet Take 1 tablet (75 mg total) by mouth 2 (two) times daily.   empagliflozin (JARDIANCE) 10 MG TABS tablet Take 1 tablet (10 mg total) by mouth daily before breakfast.   glipiZIDE (GLUCOTROL) 10 MG tablet  TAKE 1 TABLET TWICE DAILY BEFORE MEALS   levothyroxine (SYNTHROID) 75 MCG tablet TAKE 1 TABLET EVERY DAY   metFORMIN (GLUCOPHAGE) 500 MG tablet TAKE 2 TABLETS TWICE DAILY WITH MEALS   omeprazole (PRILOSEC) 20 MG capsule TAKE 1 CAPSULE EVERY DAY   ONE TOUCH LANCETS MISC Use to test blood sugar 2-3 times weekly dx: E11.49   ONETOUCH ULTRA test strip TEST BLOOD SUGAR ONCE DAILY.   simvastatin (ZOCOR) 40 MG tablet TAKE 1 TABLET AT BEDTIME   No facility-administered encounter medications on file as of 01/31/2022.

## 2022-01-31 ENCOUNTER — Ambulatory Visit (INDEPENDENT_AMBULATORY_CARE_PROVIDER_SITE_OTHER): Payer: Medicare HMO | Admitting: Family Medicine

## 2022-01-31 ENCOUNTER — Encounter: Payer: Self-pay | Admitting: Family Medicine

## 2022-01-31 VITALS — BP 100/60 | HR 102 | Temp 97.8°F | Ht 66.5 in | Wt 161.4 lb

## 2022-01-31 DIAGNOSIS — M79672 Pain in left foot: Secondary | ICD-10-CM

## 2022-01-31 DIAGNOSIS — M25552 Pain in left hip: Secondary | ICD-10-CM

## 2022-01-31 MED ORDER — DICLOFENAC SODIUM 75 MG PO TBEC: 75.0000 mg | DELAYED_RELEASE_TABLET | Freq: Two times a day (BID) | ORAL | 0 refills | Status: DC

## 2022-02-07 ENCOUNTER — Telehealth: Payer: Self-pay | Admitting: Internal Medicine

## 2022-02-07 MED ORDER — EMPAGLIFLOZIN 10 MG PO TABS: 10.0000 mg | ORAL_TABLET | Freq: Every day | ORAL | 0 refills | Status: DC

## 2022-02-07 NOTE — Telephone Encounter (Signed)
Called and spoke to [place] at [facility] and was advised that they did receive the refill.Colletta Maryland stated that she tried to explain to the patient that they are unable to get the medication at this time. Colletta Maryland stated that she told the patient if he can find it at another pharmacy she can transfer the script.Colletta Maryland stated that the patient got upset and left. Called the patient and explained the situation to him. Patient was advised to check around at larger/chain pharmacies and see if they hare the Jardiance. Patient was advised when he finds a pharmacy that has the medication to call Colletta Maryland and let her know so she can transfer the script. Patient verbalized understanding. Patient was advised if he is not able to find the medication in stock anywhere to call the office back and let Dr. Silvio Pate know.

## 2022-02-07 NOTE — Telephone Encounter (Signed)
Rx sent to pharmacy electronically. Patient notified by telephone.

## 2022-02-07 NOTE — Telephone Encounter (Signed)
Patient called and stated the medication is not at the [facility] and need it to be sent to another pharmacy and he didn't request any certain pharmacy. Call back number [phone].

## 2022-02-07 NOTE — Telephone Encounter (Signed)
Prescription Request  02/07/2022  Is this a "Controlled Substance" medicine? No  LOV: 05/05/2021  What is the name of the medication or equipment? empagliflozin (JARDIANCE) 10 MG TABS tablet    Have you contacted your pharmacy to request a refill? No   Which pharmacy would you like this sent to?  [facility], [place] [address] Phone: [phone] Fax: [phone]     Patient notified that their request is being sent to the clinical staff for review and that they should receive a response within 2 business days.   Please advise at Mobile [phone] (mobile)

## 2022-02-15 ENCOUNTER — Encounter: Payer: Medicare HMO | Admitting: Internal Medicine

## 2022-02-17 ENCOUNTER — Encounter: Payer: Self-pay | Admitting: Internal Medicine

## 2022-02-17 ENCOUNTER — Ambulatory Visit (INDEPENDENT_AMBULATORY_CARE_PROVIDER_SITE_OTHER): Payer: Medicare HMO | Admitting: Internal Medicine

## 2022-02-17 VITALS — BP 104/70 | HR 82 | Temp 97.2°F | Ht 67.0 in | Wt 164.0 lb

## 2022-02-17 DIAGNOSIS — E1149 Type 2 diabetes mellitus with other diabetic neurological complication: Secondary | ICD-10-CM

## 2022-02-17 DIAGNOSIS — I739 Peripheral vascular disease, unspecified: Secondary | ICD-10-CM

## 2022-02-17 DIAGNOSIS — I700 Atherosclerosis of aorta: Secondary | ICD-10-CM

## 2022-02-17 DIAGNOSIS — G3184 Mild cognitive impairment, so stated: Secondary | ICD-10-CM

## 2022-02-17 DIAGNOSIS — I10 Essential (primary) hypertension: Secondary | ICD-10-CM | POA: Diagnosis not present

## 2022-02-17 DIAGNOSIS — Z0000 Encounter for general adult medical examination without abnormal findings: Secondary | ICD-10-CM | POA: Diagnosis not present

## 2022-02-17 DIAGNOSIS — E039 Hypothyroidism, unspecified: Secondary | ICD-10-CM

## 2022-02-17 DIAGNOSIS — N1831 Chronic kidney disease, stage 3a: Secondary | ICD-10-CM

## 2022-02-17 LAB — HEPATIC FUNCTION PANEL
ALT: 10 U/L (ref 0–53)
AST: 17 U/L (ref 0–37)
Albumin: 4 g/dL (ref 3.5–5.2)
Alkaline Phosphatase: 68 U/L (ref 39–117)
Bilirubin, Direct: 0.2 mg/dL (ref 0.0–0.3)
Total Bilirubin: 0.8 mg/dL (ref 0.2–1.2)
Total Protein: 6.5 g/dL (ref 6.0–8.3)

## 2022-02-17 LAB — HEMOGLOBIN A1C: Hgb A1c MFr Bld: 7.5 % — ABNORMAL HIGH (ref 4.6–6.5)

## 2022-02-17 LAB — LIPID PANEL
Cholesterol: 163 mg/dL (ref 0–200)
HDL: 44.4 mg/dL (ref >39.00)
LDL Cholesterol: 93 mg/dL (ref 0–99)
NonHDL: 118.22
Total CHOL/HDL Ratio: 4
Triglycerides: 128 mg/dL (ref 0.0–149.0)
VLDL: 25.6 mg/dL (ref 0.0–40.0)

## 2022-02-17 LAB — RENAL FUNCTION PANEL
Albumin: 4 g/dL (ref 3.5–5.2)
BUN: 19 mg/dL (ref 6–23)
CO2: 27 mEq/L (ref 19–32)
Calcium: 9.6 mg/dL (ref 8.4–10.5)
Chloride: 104 mEq/L (ref 96–112)
Creatinine, Ser: 1.21 mg/dL (ref 0.40–1.50)
GFR: 55.23 mL/min — ABNORMAL LOW (ref >60.00)
Glucose, Bld: 165 mg/dL — ABNORMAL HIGH (ref 70–99)
Phosphorus: 4 mg/dL (ref 2.3–4.6)
Potassium: 4.7 mEq/L (ref 3.5–5.1)
Sodium: 141 mEq/L (ref 135–145)

## 2022-02-17 LAB — T4, FREE: Free T4: 0.91 ng/dL (ref 0.60–1.60)

## 2022-02-17 LAB — HM DIABETES FOOT EXAM

## 2022-02-17 LAB — CBC
HCT: 46.4 % (ref 39.0–52.0)
Hemoglobin: 15.2 g/dL (ref 13.0–17.0)
MCHC: 32.8 g/dL (ref 30.0–36.0)
MCV: 92.1 fl (ref 78.0–100.0)
Platelets: 221 10*3/uL (ref 150.0–400.0)
RBC: 5.03 Mil/uL (ref 4.22–5.81)
RDW: 14.1 % (ref 11.5–15.5)
WBC: 7.4 10*3/uL (ref 4.0–10.5)

## 2022-02-17 LAB — MICROALBUMIN / CREATININE URINE RATIO
Creatinine,U: 58.3 mg/dL
Microalb Creat Ratio: 4 mg/g (ref 0.0–30.0)
Microalb, Ur: 2.3 mg/dL — ABNORMAL HIGH (ref 0.0–1.9)

## 2022-02-17 LAB — TSH: TSH: 4.42 u[IU]/mL (ref 0.35–5.50)

## 2022-02-17 LAB — VITAMIN D 25 HYDROXY (VIT D DEFICIENCY, FRACTURES): VITD: 45.3 ng/mL (ref 30.00–100.00)

## 2022-02-17 NOTE — Assessment & Plan Note (Signed)
I have personally reviewed the Medicare Annual Wellness questionnaire and have noted 1. The patient's medical and social history 2. Their use of alcohol, tobacco or illicit drugs 3. Their current medications and supplements 4. The patient's functional ability including ADL's, fall risks, home safety risks and hearing or visual             impairment. 5. Diet and physical activities 6. Evidence for depression or mood disorders  The patients weight, height, BMI and visual acuity have been recorded in the chart I have made referrals, counseling and provided education to the patient based review of the above and I have provided the pt with a written personalized care plan for preventive services.  I have provided you with a copy of your personalized plan for preventive services. Please take the time to review along with your updated medication list.  Done with cancer screening Discussed doing regular exercise Consider shingrix at pharmacy Flu and updated COVID this fall---consider RSV

## 2022-02-17 NOTE — Assessment & Plan Note (Signed)
Stable function No sig cognitive change ---if worsens, consider MRI and neuro eval

## 2022-02-17 NOTE — Assessment & Plan Note (Signed)
Not checking much Hopefully control okay Mild neuropathy On glipizide 10 bid, metformin 1000 bid and jardiance 10 Go up on jardiance if not acceptable control

## 2022-02-17 NOTE — Progress Notes (Signed)
Subjective:    Patient ID: Sean Koch, male    DOB: [birth year], 82 y.o.   MRN: [id number]  HPI Here for Medicare wellness visit and follow up of chronic health conditions Reviewed advanced directives Reviewed other doctors---Dr Bulakowski--opto, dentist? No hospitalizations or surgery in past year Vision is okay Hearing is fair No alcohol or tobacco No regular exercise No falls No depression or anhedonia Independent with instrumental ADLs Stable memory issues  Doing okay in general  Some pain in left hip---points laterally Not limiting Uses the diclofenac occasionally (or may be more frequent)---discussed CKD though Occasional tylenol  Memory is about the same No change in function  Rarely checks sugars No problems with jardiance Some foot numbness--but no burning or pain  Last GFR 49 Known aortic atherosclerosis on imaging  Energy levels are okay No chest pain No SOB No dizziness or syncope No edema No palpitations No headaches  Current Outpatient Medications on File Prior to Visit  Medication Sig Dispense Refill   aspirin EC 81 MG tablet Take 81 mg by mouth daily.     Blood Glucose Monitoring Suppl (ONE TOUCH ULTRA SYSTEM KIT) w/Device KIT Use to test blood sugar once daily DX: E11.41 1 each 0   Calcium Carb-Cholecalciferol 600-800 MG-UNIT TABS Take 2 tablets by mouth daily.      diclofenac (VOLTAREN) 75 MG EC tablet Take 1 tablet (75 mg total) by mouth 2 (two) times daily. 60 tablet 0   empagliflozin (JARDIANCE) 10 MG TABS tablet Take 1 tablet (10 mg total) by mouth daily before breakfast. 90 tablet 0   glipiZIDE (GLUCOTROL) 10 MG tablet TAKE 1 TABLET TWICE DAILY BEFORE MEALS 200 tablet 0   levothyroxine (SYNTHROID) 75 MCG tablet TAKE 1 TABLET EVERY DAY 100 tablet 0   metFORMIN (GLUCOPHAGE) 500 MG tablet TAKE 2 TABLETS TWICE DAILY WITH MEALS 400 tablet 0   omeprazole (PRILOSEC) 20 MG capsule TAKE 1 CAPSULE EVERY DAY 100 capsule 0   ONE TOUCH LANCETS  MISC Use to test blood sugar 2-3 times weekly dx: E11.49 100 each 3   ONETOUCH ULTRA test strip TEST BLOOD SUGAR ONCE DAILY. 100 strip 4   simvastatin (ZOCOR) 40 MG tablet TAKE 1 TABLET AT BEDTIME 100 tablet 0   No current facility-administered medications on file prior to visit.    Allergies  Allergen Reactions   Atorvastatin     REACTION: Joint aches    Past Medical History:  Diagnosis Date   Carpal tunnel syndrome of right wrist    Cataract    ED (erectile dysfunction)    GERD (gastroesophageal reflux disease)    History of nephrolithiasis    Hyperlipidemia    Melanoma (HCC)    NIDDM, uncontrolled, with neuropathy    Personal history of colonic polyps - 2 small adenomas 07/15/2013   Renal disorder    kidney stones   Seronegative rheumatoid arthritis ([place])    Thyroid disease     Past Surgical History:  Procedure Laterality Date   CATARACT EXTRACTION W/ INTRAOCULAR LENS IMPLANT Bilateral 11/14 & 2/15   CHOLECYSTECTOMY     KNEE ARTHROSCOPY     MELANOMA EXCISION     upper right arm   PILONIDAL CYST / SINUS EXCISION     ROTATOR CUFF REPAIR     TOE SURGERY      Family History  Problem Relation Age of Onset   Heart disease Mother    Diabetes Mother    Heart disease Brother    Cancer  Neg Hx    Colon cancer Neg Hx     Social History   Socioeconomic History   Marital status: Married    Spouse name: Not on file   Number of children: 2   Years of education: Not on file   Highest education level: Not on file  Occupational History   Occupation: retired Geneticist, molecular  Tobacco Use   Smoking status: Never    Passive exposure: Past   Smokeless tobacco: Never  Substance and Sexual Activity   Alcohol use: Yes    Comment: 1 glass wine monthly per pt.   Drug use: No   Sexual activity: Not on file  Other Topics Concern   Not on file  Social History Narrative   Has living will    Wife, then son Marselino, would be health care POA   Would accept resuscitation. No prolonged  artificial ventilation.   Not sure about tube feeds--probably not prolonged   Social Determinants of Health   Financial Resource Strain: Not on file  Food Insecurity: Not on file  Transportation Needs: Not on file  Physical Activity: Not on file  Stress: Not on file  Social Connections: Not on file  Intimate Partner Violence: Not on file    Review of Systems Appetite is okay---smaller meals Weight is stable Sleeps okay--but trouble initiating at times. Wakes at 9AM---will try to initiate 9-10PM. Discussed sleep contraction Wears seat belt Has needed some cavities repaired No suspicious skin lesions No heartburn or dysphagia Bowels move fine--no blood     Objective:   Physical Exam Constitutional:      Appearance: Normal appearance.  HENT:     Mouth/Throat:     Pharynx: No oropharyngeal exudate or posterior oropharyngeal erythema.  Eyes:     Conjunctiva/sclera: Conjunctivae normal.     Pupils: Pupils are equal, round, and reactive to light.  Cardiovascular:     Rate and Rhythm: Normal rate and regular rhythm.     Heart sounds:     No gallop.     Comments: Feet cool without palpable pulses Pulmonary:     Effort: Pulmonary effort is normal.     Breath sounds: Normal breath sounds. No wheezing or rales.  Abdominal:     Palpations: Abdomen is soft.     Tenderness: There is no abdominal tenderness.  Musculoskeletal:     Cervical back: Neck supple.     Right lower leg: No edema.     Left lower leg: No edema.  Lymphadenopathy:     Cervical: No cervical adenopathy.  Skin:    Findings: No lesion or rash.     Comments: No foot lesions  Neurological:     General: No focal deficit present.     Mental Status: He is alert and oriented to person, place, and time.     Comments: Word naming--5/1 minutes (repeated them) Recall 0/3 Slightly decreased foot sensation  Psychiatric:        Mood and Affect: Mood normal.        Behavior: Behavior normal.             Assessment & Plan:

## 2022-02-17 NOTE — Assessment & Plan Note (Signed)
On imaging Is on simvastatin 40, ASA 81

## 2022-02-17 NOTE — Assessment & Plan Note (Signed)
Asked him to stop the diclofenac--or use rarely Will recheck Consider low dose ARB

## 2022-02-17 NOTE — Patient Instructions (Addendum)
Try taking tylenol 650 or '1000mg'$  three times a day regularly. Only take the diclofenac if you are having much worse pain.  You can get a shingles vaccine at your pharmacy.

## 2022-02-17 NOTE — Progress Notes (Signed)
Vision Screening   Right eye Left eye Both eyes  Without correction '20/30 20/30 20/30 '$  With correction     Hearing Screening - Comments:: Did not pass whisper test

## 2022-02-17 NOTE — Assessment & Plan Note (Signed)
Check labs on levothyroixine 75

## 2022-02-17 NOTE — Assessment & Plan Note (Signed)
BP Readings from Last 3 Encounters:  02/17/22 104/70  01/31/22 100/60  08/05/21 104/60   Good control without meds

## 2022-02-17 NOTE — Assessment & Plan Note (Signed)
Possible vague claudication symptoms--unsure No pulses If noticeable symptoms, will go ahead with vascular evaluation Is on statin, ASA

## 2022-02-18 LAB — PARATHYROID HORMONE, INTACT (NO CA): PTH: 47 pg/mL (ref 16–77)

## 2022-04-13 ENCOUNTER — Other Ambulatory Visit: Payer: Self-pay | Admitting: Internal Medicine

## 2022-04-29 ENCOUNTER — Other Ambulatory Visit: Payer: Self-pay | Admitting: Internal Medicine

## 2022-05-16 ENCOUNTER — Other Ambulatory Visit: Payer: Self-pay | Admitting: Internal Medicine

## 2022-06-02 ENCOUNTER — Other Ambulatory Visit: Payer: Self-pay | Admitting: Internal Medicine

## 2022-06-02 MED ORDER — DICLOFENAC SODIUM 75 MG PO TBEC: 75.0000 mg | DELAYED_RELEASE_TABLET | Freq: Two times a day (BID) | ORAL | 0 refills | Status: DC

## 2022-06-02 NOTE — Telephone Encounter (Signed)
Let him know his kidney function is mildly abnormal so he should use this very sparingly (like 1-2 per week only) If tylenol works, that would be safer

## 2022-06-02 NOTE — Telephone Encounter (Signed)
Dr Patsy Lager wrote it for him last time. I will forward to Dr Alphonsus Sias.

## 2022-06-02 NOTE — Telephone Encounter (Signed)
Prescription Request  06/02/2022  LOV: 02/17/2022  What is the name of the medication or equipment? diclofenac (VOLTAREN) 75 MG EC tablet   Have you contacted your pharmacy to request a refill? No   Which pharmacy would you like this sent to?  [facility] - [place], [place] - [address] [place] [address] Phone: [phone] Fax: [phone]  Patient notified that their request is being sent to the clinical staff for review and that they should receive a response within 2 business days.   Please advise at [facility] [phone]

## 2022-06-03 NOTE — Telephone Encounter (Signed)
Spoke to pt. He stated he understood. Will limit how much he takes.

## 2022-07-05 ENCOUNTER — Ambulatory Visit (INDEPENDENT_AMBULATORY_CARE_PROVIDER_SITE_OTHER): Payer: Medicare HMO | Admitting: Internal Medicine

## 2022-07-05 ENCOUNTER — Encounter: Payer: Self-pay | Admitting: Internal Medicine

## 2022-07-05 VITALS — BP 100/58 | HR 82 | Temp 98.0°F | Ht 67.0 in | Wt 157.0 lb

## 2022-07-05 DIAGNOSIS — L89302 Pressure ulcer of unspecified buttock, stage 2: Secondary | ICD-10-CM | POA: Insufficient documentation

## 2022-07-05 MED ORDER — SILVER SULFADIAZINE 1 % EX CREA: 1.0000 | TOPICAL_CREAM | Freq: Two times a day (BID) | CUTANEOUS | 0 refills | Status: DC

## 2022-07-05 NOTE — Assessment & Plan Note (Signed)
Unclear etiology Discussed using pillow with his hard wooden chair Not clearly infected Very superficial Will try silvadene cream bid prn for when it is open--and to provide protection for the area (padding)

## 2022-07-05 NOTE — Progress Notes (Signed)
Subjective:    Patient ID: Sean Koch, male    DOB: 1940/02/12, 82 y.o.   MRN: [id number]  HPI Here due to a sore on his low back  Has spot over tailbone Open at times and needs vaseline to protect it No pain when closed No drainage  Can't see--not sure if any redness  May have been there a year or so--but not really bothered him  Current Outpatient Medications on File Prior to Visit  Medication Sig Dispense Refill   aspirin EC 81 MG tablet Take 81 mg by mouth daily.     Blood Glucose Monitoring Suppl (ONE TOUCH ULTRA SYSTEM KIT) w/Device KIT Use to test blood sugar once daily DX: E11.41 1 each 0   Calcium Carb-Cholecalciferol 600-800 MG-UNIT TABS Take 2 tablets by mouth daily.      diclofenac Sodium (VOLTAREN ARTHRITIS PAIN) 1 % GEL Apply topically 4 (four) times daily.     glipiZIDE (GLUCOTROL) 10 MG tablet TAKE 1 TABLET TWICE DAILY BEFORE MEALS 180 tablet 3   JARDIANCE 10 MG TABS tablet TAKE 1 TABLET EVERY DAY BEFORE BREAKFAST 90 tablet 3   levothyroxine (SYNTHROID) 75 MCG tablet TAKE 1 TABLET EVERY DAY 90 tablet 3   metFORMIN (GLUCOPHAGE) 500 MG tablet TAKE 2 TABLETS TWICE A DAY WITH MEALS 360 tablet 3   omeprazole (PRILOSEC) 20 MG capsule TAKE 1 CAPSULE EVERY DAY 90 capsule 3   ONE TOUCH LANCETS MISC Use to test blood sugar 2-3 times weekly dx: E11.49 100 each 3   ONETOUCH ULTRA test strip TEST BLOOD SUGAR ONCE DAILY. 100 strip 4   simvastatin (ZOCOR) 40 MG tablet TAKE 1 TABLET AT BEDTIME 90 tablet 3   No current facility-administered medications on file prior to visit.    Allergies  Allergen Reactions   Atorvastatin     REACTION: Joint aches    Past Medical History:  Diagnosis Date   Carpal tunnel syndrome of right wrist    Cataract    ED (erectile dysfunction)    GERD (gastroesophageal reflux disease)    History of nephrolithiasis    Hyperlipidemia    Melanoma (HCC)    NIDDM, uncontrolled, with neuropathy    Personal history of colonic polyps - 2  small adenomas 07/15/2013   Renal disorder    kidney stones   Seronegative rheumatoid arthritis (HCC)    Thyroid disease     Past Surgical History:  Procedure Laterality Date   CATARACT EXTRACTION W/ INTRAOCULAR LENS IMPLANT Bilateral 11/14 & 2/15   CHOLECYSTECTOMY     KNEE ARTHROSCOPY     MELANOMA EXCISION     upper right arm   PILONIDAL CYST / SINUS EXCISION     ROTATOR CUFF REPAIR     TOE SURGERY      Family History  Problem Relation Age of Onset   Heart disease Mother    Diabetes Mother    Heart disease Brother    Cancer Neg Hx    Colon cancer Neg Hx     Social History   Socioeconomic History   Marital status: Married    Spouse name: Not on file   Number of children: 2   Years of education: Not on file   Highest education level: Not on file  Occupational History   Occupation: retired Landscape architect  Tobacco Use   Smoking status: Never    Passive exposure: Past   Smokeless tobacco: Never  Substance and Sexual Activity   Alcohol use: Yes  Comment: 1 glass wine monthly per pt.   Drug use: No   Sexual activity: Not on file  Other Topics Concern   Not on file  Social History Narrative   Has living will    Wife, then son Lorin Picket, would be health care POA   Would accept resuscitation. No prolonged artificial ventilation.   Not sure about tube feeds--probably not prolonged   Social Determinants of Health   Financial Resource Strain: Not on file  Food Insecurity: Not on file  Transportation Needs: Not on file  Physical Activity: Not on file  Stress: Not on file  Social Connections: Not on file  Intimate Partner Violence: Not on file   Review of Systems No fever Weight down some---no special effort    Objective:   Physical Exam Constitutional:      Appearance: Normal appearance.  Skin:    Comments: Very superficial ulceration in midline in upper buttocks (below pilonidal area) Slight redness but not really warm or tender  Neurological:     Mental  Status: He is alert.            Assessment & Plan:

## 2022-08-18 ENCOUNTER — Ambulatory Visit: Payer: Medicare HMO | Admitting: Internal Medicine

## 2022-08-22 ENCOUNTER — Ambulatory Visit: Payer: Medicare HMO | Admitting: Internal Medicine

## 2022-08-23 ENCOUNTER — Encounter: Payer: Self-pay | Admitting: Internal Medicine

## 2022-08-25 ENCOUNTER — Ambulatory Visit (INDEPENDENT_AMBULATORY_CARE_PROVIDER_SITE_OTHER): Payer: Medicare HMO | Admitting: Internal Medicine

## 2022-08-25 ENCOUNTER — Encounter: Payer: Self-pay | Admitting: Internal Medicine

## 2022-08-25 VITALS — BP 108/60 | HR 77 | Temp 98.3°F | Ht 67.0 in | Wt 156.0 lb

## 2022-08-25 DIAGNOSIS — E1149 Type 2 diabetes mellitus with other diabetic neurological complication: Secondary | ICD-10-CM

## 2022-08-25 DIAGNOSIS — I10 Essential (primary) hypertension: Secondary | ICD-10-CM | POA: Diagnosis not present

## 2022-08-25 DIAGNOSIS — G3184 Mild cognitive impairment, so stated: Secondary | ICD-10-CM

## 2022-08-25 DIAGNOSIS — Z7984 Long term (current) use of oral hypoglycemic drugs: Secondary | ICD-10-CM | POA: Diagnosis not present

## 2022-08-25 LAB — POCT GLYCOSYLATED HEMOGLOBIN (HGB A1C): Hemoglobin A1C: 6.6 % — AB (ref 4.0–5.6)

## 2022-08-25 NOTE — Progress Notes (Signed)
Subjective:    Patient ID: Sean Koch, male    DOB: [birth year], 82 y.o.   MRN: [id number]  HPI Here for follow up of diabetes and other chronic medical conditions  Doing okay Hasn't checked sugars but careful with eating Not exercising--discussed  No chest pain or SOB No palpitations No dizziness or syncope  Ongoing mild memory problems Actually missed our exit last week for appt--and ended up in [place] Wife now driving him when he needs to go outside his neighborhood No other functional changes  Current Outpatient Medications on File Prior to Visit  Medication Sig Dispense Refill   aspirin EC 81 MG tablet Take 81 mg by mouth daily.     Blood Glucose Monitoring Suppl (ONE TOUCH ULTRA SYSTEM KIT) w/Device KIT Use to test blood sugar once daily DX: E11.41 1 each 0   Calcium Carb-Cholecalciferol 600-800 MG-UNIT TABS Take 2 tablets by mouth daily.      diclofenac Sodium (VOLTAREN ARTHRITIS PAIN) 1 % GEL Apply topically 4 (four) times daily.     glipiZIDE (GLUCOTROL) 10 MG tablet TAKE 1 TABLET TWICE DAILY BEFORE MEALS 180 tablet 3   JARDIANCE 10 MG TABS tablet TAKE 1 TABLET EVERY DAY BEFORE BREAKFAST 90 tablet 3   levothyroxine (SYNTHROID) 75 MCG tablet TAKE 1 TABLET EVERY DAY 90 tablet 3   metFORMIN (GLUCOPHAGE) 500 MG tablet TAKE 2 TABLETS TWICE A DAY WITH MEALS 360 tablet 3   omeprazole (PRILOSEC) 20 MG capsule TAKE 1 CAPSULE EVERY DAY 90 capsule 3   ONE TOUCH LANCETS MISC Use to test blood sugar 2-3 times weekly dx: E11.49 100 each 3   ONETOUCH ULTRA test strip TEST BLOOD SUGAR ONCE DAILY. 100 strip 4   silver sulfADIAZINE (SILVADENE) 1 % cream Apply 1 Application topically 2 (two) times daily. 50 g 0   simvastatin (ZOCOR) 40 MG tablet TAKE 1 TABLET AT BEDTIME 90 tablet 3   No current facility-administered medications on file prior to visit.    Allergies  Allergen Reactions   Atorvastatin     REACTION: Joint aches    Past Medical History:  Diagnosis Date    Carpal tunnel syndrome of right wrist    Cataract    ED (erectile dysfunction)    GERD (gastroesophageal reflux disease)    History of nephrolithiasis    Hyperlipidemia    Melanoma (HCC)    NIDDM, uncontrolled, with neuropathy    Personal history of colonic polyps - 2 small adenomas 07/15/2013   Renal disorder    kidney stones   Seronegative rheumatoid arthritis (HCC)    Thyroid disease     Past Surgical History:  Procedure Laterality Date   CATARACT EXTRACTION W/ INTRAOCULAR LENS IMPLANT Bilateral 11/14 & 2/15   CHOLECYSTECTOMY     KNEE ARTHROSCOPY     MELANOMA EXCISION     upper right arm   PILONIDAL CYST / SINUS EXCISION     ROTATOR CUFF REPAIR     TOE SURGERY      Family History  Problem Relation Age of Onset   Heart disease Mother    Diabetes Mother    Heart disease Brother    Cancer Neg Hx    Colon cancer Neg Hx     Social History   Socioeconomic History   Marital status: Married    Spouse name: Not on file   Number of children: 2   Years of education: Not on file   Highest education level: Not on file  Occupational  History   Occupation: retired Landscape architect  Tobacco Use   Smoking status: Never    Passive exposure: Past   Smokeless tobacco: Never  Substance and Sexual Activity   Alcohol use: Yes    Comment: 1 glass wine monthly per pt.   Drug use: No   Sexual activity: Not on file  Other Topics Concern   Not on file  Social History Narrative   Has living will    Wife, then son Lorin Picket, would be health care POA   Would accept resuscitation. No prolonged artificial ventilation.   Not sure about tube feeds--probably not prolonged   Social Determinants of Health   Financial Resource Strain: Not on file  Food Insecurity: Not on file  Transportation Needs: Not on file  Physical Activity: Not on file  Stress: Not on file  Social Connections: Not on file  Intimate Partner Violence: Not on file   Review of Systems Sleeps okay--just some trouble iniating  at times     Objective:   Physical Exam Constitutional:      Appearance: Normal appearance.  Cardiovascular:     Rate and Rhythm: Normal rate and regular rhythm.     Pulses: Normal pulses.     Heart sounds: No murmur heard.    No gallop.  Pulmonary:     Effort: Pulmonary effort is normal.     Breath sounds: Normal breath sounds. No wheezing or rales.  Musculoskeletal:     Cervical back: Neck supple.     Right lower leg: No edema.     Left lower leg: No edema.  Lymphadenopathy:     Cervical: No cervical adenopathy.  Neurological:     Mental Status: He is alert.  Psychiatric:        Mood and Affect: Mood normal.        Behavior: Behavior normal.            Assessment & Plan:

## 2022-08-25 NOTE — Progress Notes (Signed)
Lvmtcb

## 2022-08-25 NOTE — Assessment & Plan Note (Signed)
BP Readings from Last 3 Encounters:  08/25/22 108/60  07/05/22 (!) 100/58  02/17/22 104/70   Controlled without Rx now

## 2022-08-25 NOTE — Assessment & Plan Note (Signed)
Lab Results  Component Value Date   HGBA1C 6.6 (A) 08/25/2022   Excellent control--improved On glipizide 10mg  bid, jardiance 10 daily, metformin 1000 bid No sig neuropathy pain

## 2022-08-25 NOTE — Assessment & Plan Note (Signed)
Did have bad episode of getting lost--but otherwise reports no other sig changes Discussed further evaluation ---blood work in the works to test for AD, etc He defers neurology evaluation for now

## 2022-09-01 ENCOUNTER — Encounter: Payer: Self-pay | Admitting: Family Medicine

## 2022-09-01 ENCOUNTER — Ambulatory Visit: Payer: Medicare HMO | Admitting: Family Medicine

## 2022-09-01 VITALS — BP 100/60 | HR 67 | Temp 97.4°F | Ht 67.0 in | Wt 155.4 lb

## 2022-09-01 DIAGNOSIS — M5137 Other intervertebral disc degeneration, lumbosacral region: Secondary | ICD-10-CM

## 2022-09-01 DIAGNOSIS — M1712 Unilateral primary osteoarthritis, left knee: Secondary | ICD-10-CM | POA: Diagnosis not present

## 2022-09-01 NOTE — Progress Notes (Signed)
Brandilee Pies T. Emin Foree, MD, CAQ Sports Medicine [facility] [address] [place], [address]  Phone: [phone]  FAX: [phone]  Betty Dada - 82 y.o. male  MRN [id number]  Date of Birth: 07-09-1940  Date: 09/01/2022  PCP: Karie Schwalbe, MD  Referral: Karie Schwalbe, MD  Chief Complaint  Patient presents with   Knee Pain    Left and radiates up to his hip   Subjective:   Sean Koch is a 82 y.o. very pleasant male patient with Body mass index is 24.34 kg/m. who presents with the following:  Patient presents with ongoing back pain and what he describes as hip pain and knee pain.  I did pull up the patient's CT abdomen from 2019, and he does have multilevel degenerative disc disease.  This is worse at L5-S1 and in the lowest thoracic spine.  L leg pain radiating to the hip has improved.  Does not bother him at all.  Now he has no symptoms.  L knee lacks extension.  Does not hurt.   Not hurt all that bad. He does have pain with standing on the L leg, but right now this is only bothering him a little bit. He does not have an effusion. No recent knee injury  Tylenol will help some when it bothers him.   He does have chronic kidney disease stage III.  Review of Systems is noted in the HPI, as appropriate  Objective:   BP 100/60 (BP Location: Left Arm, Patient Position: Sitting, Cuff Size: Normal)   Pulse 67   Temp (!) 97.4 F (36.3 C) (Temporal)   Ht 5\' 7"  (1.702 m)   Wt 155 lb 6 oz (70.5 kg)   SpO2 96%   BMI 24.34 kg/m   GEN: No acute distress; alert,appropriate. PULM: Breathing comfortably in no respiratory distress PSYCH: Normally interactive.   Left knee: Lacks 5 degrees of extension Flexion to 115 No significant effusion Relatively poor motion at the patella Stable to varus and valgus stress.  ACL and PCL are intact He does have some very mild medial joint line tenderness, and none on the  lateral joint line. Flexion pinch, bounce home, and McMurray's are all negative.  Laboratory and Imaging Data:  Assessment and Plan:     ICD-10-CM   1. Primary osteoarthritis of left knee  M17.12     2. DDD (degenerative disc disease), lumbosacral  M51.37      His back pain and hip pain are completely resolved.  Knee pain is only minimally bothering him right now, but there are times where it does bother him when he is up and about.  I have encouraged him to keep active, and I think that it is reasonable for him to use some Tylenol as needed.  With chronic kidney stage III, would be a little bit more cautious with oral NSAIDs.  Since nothing is really bothering him that much today, I think he can follow-up with me on a as needed basis.  Patient Instructions  Voltaren 1% gel, over the counter You can apply up to 4 times a day  This can be applied to any joint: knee, wrist, fingers, elbows, shoulders, feet and ankles. Can apply to any tendon: tennis elbow, achilles, tendon, rotator cuff or any other tendon.  Minimal is absorbed in the bloodstream: ok with oral anti-inflammatory or a blood thinner.  Cost is about 9 dollars    Medication Management during today's office  visit: No orders of the defined types were placed in this encounter.  Medications Discontinued During This Encounter  Medication Reason   silver sulfADIAZINE (SILVADENE) 1 % cream Completed Course   diclofenac Sodium (VOLTAREN ARTHRITIS PAIN) 1 % GEL Completed Course    Orders placed today for conditions managed today: No orders of the defined types were placed in this encounter.   Disposition: No follow-ups on file.  [employer] speech-to-text software was used for transcription in this dictation.  Possible transcriptional errors can occur using Animal nutritionist.   Signed,  Elpidio Galea. Rhylen Shaheen, MD   Outpatient Encounter Medications as of 09/01/2022  Medication Sig   aspirin EC 81 MG tablet Take  81 mg by mouth daily.   Blood Glucose Monitoring Suppl (ONE TOUCH ULTRA SYSTEM KIT) w/Device KIT Use to test blood sugar once daily DX: E11.41   Calcium Carb-Cholecalciferol 600-800 MG-UNIT TABS Take 2 tablets by mouth daily.    glipiZIDE (GLUCOTROL) 10 MG tablet TAKE 1 TABLET TWICE DAILY BEFORE MEALS   JARDIANCE 10 MG TABS tablet TAKE 1 TABLET EVERY DAY BEFORE BREAKFAST   levothyroxine (SYNTHROID) 75 MCG tablet TAKE 1 TABLET EVERY DAY   metFORMIN (GLUCOPHAGE) 500 MG tablet TAKE 2 TABLETS TWICE A DAY WITH MEALS   omeprazole (PRILOSEC) 20 MG capsule TAKE 1 CAPSULE EVERY DAY   ONE TOUCH LANCETS MISC Use to test blood sugar 2-3 times weekly dx: E11.49   ONETOUCH ULTRA test strip TEST BLOOD SUGAR ONCE DAILY.   simvastatin (ZOCOR) 40 MG tablet TAKE 1 TABLET AT BEDTIME   [DISCONTINUED] diclofenac Sodium (VOLTAREN ARTHRITIS PAIN) 1 % GEL Apply topically 4 (four) times daily.   [DISCONTINUED] silver sulfADIAZINE (SILVADENE) 1 % cream Apply 1 Application topically 2 (two) times daily.   No facility-administered encounter medications on file as of 09/01/2022.

## 2022-09-01 NOTE — Patient Instructions (Signed)
Voltaren 1% gel, over the counter ?You can apply up to 4 times a day ? ?This can be applied to any joint: knee, wrist, fingers, elbows, shoulders, feet and ankles. ?Can apply to any tendon: tennis elbow, achilles, tendon, rotator cuff or any other tendon. ? ?Minimal is absorbed in the bloodstream: ok with oral anti-inflammatory or a blood thinner. ? ?Cost is about 9 dollars  ?

## 2022-09-28 ENCOUNTER — Telehealth: Payer: Self-pay | Admitting: Internal Medicine

## 2022-09-28 NOTE — Telephone Encounter (Signed)
Pt called requesting that the $50 no show fee from his account be removed. Pt states he accidentally missed the appt on 8/12 due to [place]. Please advise. Call back # [phone]

## 2022-11-17 ENCOUNTER — Ambulatory Visit: Payer: Medicare HMO | Admitting: Internal Medicine

## 2022-11-17 ENCOUNTER — Encounter: Payer: Self-pay | Admitting: Internal Medicine

## 2022-11-17 VITALS — BP 104/60 | HR 74 | Temp 98.9°F | Ht 67.0 in | Wt 154.0 lb

## 2022-11-17 DIAGNOSIS — M7501 Adhesive capsulitis of right shoulder: Secondary | ICD-10-CM | POA: Diagnosis not present

## 2022-11-17 DIAGNOSIS — M7500 Adhesive capsulitis of unspecified shoulder: Secondary | ICD-10-CM | POA: Insufficient documentation

## 2022-11-17 NOTE — Assessment & Plan Note (Signed)
Discussed ROM exercises Not bothering him much---but if worsens, can try cortisone injection  Discussed that this would not cause transient arm redness---and I don't have an explanation for that

## 2022-11-17 NOTE — Progress Notes (Signed)
Subjective:    Patient ID: Sean Koch, male    DOB: [birth year], 82 y.o.   MRN: [id number]  HPI Here due to right arm/shoulder trouble  Yesterday---entire arm from elbow up to shoulder looked "blood red" Only lasted an hour or so Noticed it in the shower No pain No trouble with movement Hasn't recurred  Does note some trouble with ROM in right shoulder for some weeks  Current Outpatient Medications on File Prior to Visit  Medication Sig Dispense Refill   aspirin EC 81 MG tablet Take 81 mg by mouth daily.     Blood Glucose Monitoring Suppl (ONE TOUCH ULTRA SYSTEM KIT) w/Device KIT Use to test blood sugar once daily DX: E11.41 1 each 0   Calcium Carb-Cholecalciferol 600-800 MG-UNIT TABS Take 2 tablets by mouth daily.      glipiZIDE (GLUCOTROL) 10 MG tablet TAKE 1 TABLET TWICE DAILY BEFORE MEALS 180 tablet 3   JARDIANCE 10 MG TABS tablet TAKE 1 TABLET EVERY DAY BEFORE BREAKFAST 90 tablet 3   levothyroxine (SYNTHROID) 75 MCG tablet TAKE 1 TABLET EVERY DAY 90 tablet 3   metFORMIN (GLUCOPHAGE) 500 MG tablet TAKE 2 TABLETS TWICE A DAY WITH MEALS 360 tablet 3   omeprazole (PRILOSEC) 20 MG capsule TAKE 1 CAPSULE EVERY DAY 90 capsule 3   ONE TOUCH LANCETS MISC Use to test blood sugar 2-3 times weekly dx: E11.49 100 each 3   ONETOUCH ULTRA test strip TEST BLOOD SUGAR ONCE DAILY. 100 strip 4   simvastatin (ZOCOR) 40 MG tablet TAKE 1 TABLET AT BEDTIME 90 tablet 3   No current facility-administered medications on file prior to visit.    Allergies  Allergen Reactions   Atorvastatin     REACTION: Joint aches    Past Medical History:  Diagnosis Date   Carpal tunnel syndrome of right wrist    Cataract    ED (erectile dysfunction)    GERD (gastroesophageal reflux disease)    History of nephrolithiasis    Hyperlipidemia    Melanoma (HCC)    NIDDM, uncontrolled, with neuropathy    Personal history of colonic polyps - 2 small adenomas 07/15/2013   Renal disorder    kidney stones    Seronegative rheumatoid arthritis (HCC)    Thyroid disease     Past Surgical History:  Procedure Laterality Date   CATARACT EXTRACTION W/ INTRAOCULAR LENS IMPLANT Bilateral 11/14 & 2/15   CHOLECYSTECTOMY     KNEE ARTHROSCOPY     MELANOMA EXCISION     upper right arm   PILONIDAL CYST / SINUS EXCISION     ROTATOR CUFF REPAIR     TOE SURGERY      Family History  Problem Relation Age of Onset   Heart disease Mother    Diabetes Mother    Heart disease Brother    Cancer Neg Hx    Colon cancer Neg Hx     Social History   Socioeconomic History   Marital status: Married    Spouse name: Not on file   Number of children: 2   Years of education: Not on file   Highest education level: Not on file  Occupational History   Occupation: retired Landscape architect  Tobacco Use   Smoking status: Never    Passive exposure: Past   Smokeless tobacco: Never  Substance and Sexual Activity   Alcohol use: Yes    Comment: 1 glass wine monthly per pt.   Drug use: No   Sexual activity: Not on  file  Other Topics Concern   Not on file  Social History Narrative   Has living will    Wife, then son Lorin Picket, would be health care POA   Would accept resuscitation. No prolonged artificial ventilation.   Not sure about tube feeds--probably not prolonged   Social Determinants of Health   Financial Resource Strain: Not on file  Food Insecurity: Not on file  Transportation Needs: Not on file  Physical Activity: Not on file  Stress: Not on file  Social Connections: Not on file  Intimate Partner Violence: Not on file   Review of Systems No fever No injuries No swelling in arm/chest     Objective:   Physical Exam Constitutional:      Appearance: Normal appearance.  Musculoskeletal:     Comments: Limited abduction and external rotation  Skin:    Comments: No rash or skin discoloration  Neurological:     Mental Status: He is alert.            Assessment & Plan:

## 2022-12-12 ENCOUNTER — Telehealth: Payer: Self-pay | Admitting: Internal Medicine

## 2022-12-12 MED ORDER — GLIPIZIDE 10 MG PO TABS: ORAL_TABLET | ORAL | 3 refills | Status: DC

## 2022-12-12 MED ORDER — LEVOTHYROXINE SODIUM 75 MCG PO TABS: 75.0000 ug | ORAL_TABLET | Freq: Every day | ORAL | 3 refills | Status: DC

## 2022-12-12 MED ORDER — EMPAGLIFLOZIN 10 MG PO TABS: 10.0000 mg | ORAL_TABLET | Freq: Every day | ORAL | 3 refills | Status: DC

## 2022-12-12 MED ORDER — OMEPRAZOLE 20 MG PO CPDR: 20.0000 mg | DELAYED_RELEASE_CAPSULE | Freq: Every day | ORAL | 3 refills | Status: DC

## 2022-12-12 MED ORDER — SIMVASTATIN 40 MG PO TABS: 40.0000 mg | ORAL_TABLET | Freq: Every day | ORAL | 3 refills | Status: DC

## 2022-12-12 NOTE — Telephone Encounter (Signed)
Prescription Request  12/12/2022  LOV: 11/17/2022  What is the name of the medication or equipment? glipiZIDE (GLUCOTROL) 10 MG tablet JARDIANCE 10 MG TABS tablet levothyroxine (SYNTHROID) 75 MCG tablet metFORMIN (GLUCOPHAGE) 500 MG tablet omeprazole (PRILOSEC) 20 MG capsule simvastatin (ZOCOR) 40 MG tablet   Have you contacted your pharmacy to request a refill? No   Which pharmacy would you like this sent to?   [id number] [place], [place] - [address] [place] [address] Phone: [phone] Fax: [phone]    Patient notified that their request is being sent to the clinical staff for review and that they should receive a response within 2 business days.   Please advise at Mobile [phone] (mobile)

## 2022-12-12 NOTE — Telephone Encounter (Signed)
Rx sent to [facility] [address] as requested.

## 2022-12-28 ENCOUNTER — Other Ambulatory Visit: Payer: Self-pay | Admitting: Internal Medicine

## 2022-12-28 MED ORDER — EMPAGLIFLOZIN 10 MG PO TABS: 10.0000 mg | ORAL_TABLET | Freq: Every day | ORAL | 3 refills | Status: DC

## 2022-12-28 NOTE — Telephone Encounter (Signed)
Copied from CRM [id number]. Topic: Clinical - Medication Refill >> Dec 28, 2022 10:54 AM Gaetano Hawthorne wrote: Most Recent Primary Care Visit:  Provider: Tillman Abide I  Department: [facility]  Visit Type: OFFICE VISIT  Date: 11/17/2022  Medication: ***  Has the patient contacted their pharmacy?  (Agent: If no, request that the patient contact the pharmacy for the refill. If patient does not wish to contact the pharmacy document the reason why and proceed with request.) (Agent: If yes, when and what did the pharmacy advise?)  Is this the correct pharmacy for this prescription?  If no, delete pharmacy and type the correct one.  This is the patient's preferred pharmacy:  [facility] - [place], [place] - [address] [place] [address] Phone: [phone] Fax: [phone]  [facility] - [place], [place] - [id number] [place] [address] Phone: [phone] Fax: [phone]  [facility] - [place], [place] - [address] [place] [address] Phone: [phone] Fax: [phone]  [facility] [address] [place]), [place] - [address] [place] [address] Phone: [phone] Fax: [phone]  [id number] - [place], [place] - [address] [place] [address] Phone: [phone] Fax: [phone]   Has the prescription been filled recently?   Is the patient out of the medication?   Has the patient been seen for an appointment in the last year OR does the patient have an upcoming appointment?   Can we respond through MyChart?   Agent: Please be advised that Rx refills may take up to 3 business days. We ask that you follow-up with your pharmacy.

## 2023-01-19 ENCOUNTER — Encounter: Payer: Self-pay | Admitting: Internal Medicine

## 2023-01-19 ENCOUNTER — Ambulatory Visit: Payer: Medicare Other | Admitting: Internal Medicine

## 2023-01-19 VITALS — BP 114/70 | HR 78 | Temp 98.0°F | Ht 67.0 in | Wt 158.0 lb

## 2023-01-19 DIAGNOSIS — M7501 Adhesive capsulitis of right shoulder: Secondary | ICD-10-CM | POA: Diagnosis not present

## 2023-01-19 NOTE — Progress Notes (Signed)
 Subjective:    Patient ID: Sean Koch, male    DOB: 1940-08-24, 83 y.o.   MRN: [id number]  HPI Here due to right shoulder pain  Still with trouble from months ago Pain if he abducts it much No pain with elbow movement  Hasn't done any treatment for it  Current Outpatient Medications on File Prior to Visit  Medication Sig Dispense Refill   aspirin  EC 81 MG tablet Take 81 mg by mouth daily.     Blood Glucose Monitoring Suppl (ONE TOUCH ULTRA SYSTEM KIT) w/Device KIT Use to test blood sugar once daily DX: E11.41 1 each 0   Calcium  Carb-Cholecalciferol  600-800 MG-UNIT TABS Take 2 tablets by mouth daily.      empagliflozin  (JARDIANCE ) 10 MG TABS tablet Take 1 tablet (10 mg total) by mouth daily. TAKE 1 TABLET EVERY DAY BEFORE BREAKFAST 90 tablet 3   glipiZIDE  (GLUCOTROL ) 10 MG tablet TAKE 1 TABLET TWICE DAILY BEFORE MEALS 180 tablet 3   levothyroxine  (SYNTHROID ) 75 MCG tablet Take 1 tablet (75 mcg total) by mouth daily. 90 tablet 3   metFORMIN  (GLUCOPHAGE ) 500 MG tablet TAKE 2 TABLETS TWICE A DAY WITH MEALS 360 tablet 3   omeprazole  (PRILOSEC) 20 MG capsule Take 1 capsule (20 mg total) by mouth daily. 90 capsule 3   ONE TOUCH LANCETS MISC Use to test blood sugar 2-3 times weekly dx: E11.49 100 each 3   ONETOUCH ULTRA test strip TEST BLOOD SUGAR ONCE DAILY. 100 strip 4   simvastatin  (ZOCOR ) 40 MG tablet Take 1 tablet (40 mg total) by mouth at bedtime. 90 tablet 3   No current facility-administered medications on file prior to visit.    Allergies  Allergen Reactions   Atorvastatin     REACTION: Joint aches    Past Medical History:  Diagnosis Date   Carpal tunnel syndrome of right wrist    Cataract    ED (erectile dysfunction)    GERD (gastroesophageal reflux disease)    History of nephrolithiasis    Hyperlipidemia    Melanoma (HCC)    NIDDM, uncontrolled, with neuropathy    Personal history of colonic polyps - 2 small adenomas 07/15/2013   Renal disorder    kidney  stones   Seronegative rheumatoid arthritis (HCC)    Thyroid  disease     Past Surgical History:  Procedure Laterality Date   CATARACT EXTRACTION W/ INTRAOCULAR LENS IMPLANT Bilateral 11/14 & 2/15   CHOLECYSTECTOMY     KNEE ARTHROSCOPY     MELANOMA EXCISION     upper right arm   PILONIDAL CYST / SINUS EXCISION     ROTATOR CUFF REPAIR     TOE SURGERY      Family History  Problem Relation Age of Onset   Heart disease Mother    Diabetes Mother    Heart disease Brother    Cancer Neg Hx    Colon cancer Neg Hx     Social History   Socioeconomic History   Marital status: Married    Spouse name: Not on file   Number of children: 2   Years of education: Not on file   Highest education level: Not on file  Occupational History   Occupation: retired landscape architect  Tobacco Use   Smoking status: Never    Passive exposure: Past   Smokeless tobacco: Never  Substance and Sexual Activity   Alcohol use: Yes    Comment: 1 glass wine monthly per pt.   Drug use: No  Sexual activity: Not on file  Other Topics Concern   Not on file  Social History Narrative   Has living will    Wife, then son Glendia, would be health care POA   Would accept resuscitation. No prolonged artificial ventilation.   Not sure about tube feeds--probably not prolonged   Social Drivers of Corporate Investment Banker Strain: Not on file  Food Insecurity: Not on file  Transportation Needs: Not on file  Physical Activity: Not on file  Stress: Not on file  Social Connections: Not on file  Intimate Partner Violence: Not on file      Review of Systems     Objective:   Physical Exam Musculoskeletal:     Comments: Same sig decreased ROM at right shoulder            Assessment & Plan:

## 2023-01-19 NOTE — Assessment & Plan Note (Signed)
 Persistent trouble and pain Discussed options including cortisone injection----pros/cons He gave verbal consent  Sterile prep posterior right shoulder Ethyl chloride then 2cc 1% plain lidocaine  40mg  methylprednisolone /4 cc 1% lidocaine  instilled  Tolerated well Discussed home care with ROM exercises, etc If not improving, will refer to ortho

## 2023-02-13 NOTE — Code Documentation (Cosign Needed)
Well, I was out of the office 1-8 through 1-10. My daughter was in a car accident on the 8th and it put her in early labor. So my granddaughter was born on the 58th. I definitely was not in the office...lol.   But, here is the [facility] for the [facility]: [id number]  He is used to me always being here.

## 2023-02-13 NOTE — Code Documentation (Cosign Needed)
I wasn't here that day. I will get it for you, though. I don't think it can be added without reordering it.

## 2023-03-09 ENCOUNTER — Encounter: Payer: Self-pay | Admitting: Internal Medicine

## 2023-03-09 ENCOUNTER — Ambulatory Visit (INDEPENDENT_AMBULATORY_CARE_PROVIDER_SITE_OTHER): Payer: Medicare Other | Admitting: Internal Medicine

## 2023-03-09 VITALS — BP 108/60 | Temp 98.3°F | Resp 0 | Ht 67.0 in | Wt 156.0 lb

## 2023-03-09 DIAGNOSIS — M7501 Adhesive capsulitis of right shoulder: Secondary | ICD-10-CM | POA: Diagnosis not present

## 2023-03-09 NOTE — Patient Instructions (Signed)
 Please try regular tylenol ---like 650 or 1000mg  three times a day. Use topical diclofenac gel---available over the counter--three times a day also. If it is not better in 2 weeks or so, I can refer you to an orthopedic surgeon

## 2023-03-09 NOTE — Progress Notes (Signed)
 Subjective:    Patient ID: Sean Koch, male    DOB: May 26, 1940, 83 y.o.   MRN: [id number]  HPI Here due to right shoulder pain  Did get better after the cortisone injection 6 weeks ago Pain started again 3 days ago Notices it when he lifts his arm--and is restricted  No injury and no new tasks --like lifting No swelling  No treatment since the pain started again Current Outpatient Medications on File Prior to Visit  Medication Sig Dispense Refill   aspirin EC 81 MG tablet Take 81 mg by mouth daily.     Blood Glucose Monitoring Suppl (ONE TOUCH ULTRA SYSTEM KIT) w/Device KIT Use to test blood sugar once daily DX: E11.41 1 each 0   Calcium Carb-Cholecalciferol 600-800 MG-UNIT TABS Take 2 tablets by mouth daily.      empagliflozin (JARDIANCE) 10 MG TABS tablet Take 1 tablet (10 mg total) by mouth daily. TAKE 1 TABLET EVERY DAY BEFORE BREAKFAST 90 tablet 3   glipiZIDE (GLUCOTROL) 10 MG tablet TAKE 1 TABLET TWICE DAILY BEFORE MEALS 180 tablet 3   levothyroxine (SYNTHROID) 75 MCG tablet Take 1 tablet (75 mcg total) by mouth daily. 90 tablet 3   metFORMIN (GLUCOPHAGE) 500 MG tablet TAKE 2 TABLETS TWICE A DAY WITH MEALS 360 tablet 3   omeprazole (PRILOSEC) 20 MG capsule Take 1 capsule (20 mg total) by mouth daily. 90 capsule 3   ONE TOUCH LANCETS MISC Use to test blood sugar 2-3 times weekly dx: E11.49 100 each 3   ONETOUCH ULTRA test strip TEST BLOOD SUGAR ONCE DAILY. 100 strip 4   simvastatin (ZOCOR) 40 MG tablet Take 1 tablet (40 mg total) by mouth at bedtime. 90 tablet 3   No current facility-administered medications on file prior to visit.    Allergies  Allergen Reactions   Atorvastatin     REACTION: Joint aches    Past Medical History:  Diagnosis Date   Carpal tunnel syndrome of right wrist    Cataract    ED (erectile dysfunction)    GERD (gastroesophageal reflux disease)    History of nephrolithiasis    Hyperlipidemia    Melanoma (HCC)    NIDDM, uncontrolled,  with neuropathy    Personal history of colonic polyps - 2 small adenomas 07/15/2013   Renal disorder    kidney stones   Seronegative rheumatoid arthritis (HCC)    Thyroid disease     Past Surgical History:  Procedure Laterality Date   CATARACT EXTRACTION W/ INTRAOCULAR LENS IMPLANT Bilateral 11/14 & 2/15   CHOLECYSTECTOMY     KNEE ARTHROSCOPY     MELANOMA EXCISION     upper right arm   PILONIDAL CYST / SINUS EXCISION     ROTATOR CUFF REPAIR     TOE SURGERY      Family History  Problem Relation Age of Onset   Heart disease Mother    Diabetes Mother    Heart disease Brother    Cancer Neg Hx    Colon cancer Neg Hx     Social History   Socioeconomic History   Marital status: Married    Spouse name: Not on file   Number of children: 2   Years of education: Not on file   Highest education level: Not on file  Occupational History   Occupation: retired Landscape architect  Tobacco Use   Smoking status: Never    Passive exposure: Past   Smokeless tobacco: Never  Substance and Sexual Activity  Alcohol use: Yes    Comment: 1 glass wine monthly per pt.   Drug use: No   Sexual activity: Not on file  Other Topics Concern   Not on file  Social History Narrative   Has living will    Wife, then son Lorin Picket, would be health care POA   Would accept resuscitation. No prolonged artificial ventilation.   Not sure about tube feeds--probably not prolonged   Social Drivers of Corporate investment banker Strain: Not on file  Food Insecurity: Not on file  Transportation Needs: Not on file  Physical Activity: Not on file  Stress: Not on file  Social Connections: Not on file  Intimate Partner Violence: Not on file   Review of Systems No fever Sleeps okay--no trouble in bed     Objective:   Physical Exam Musculoskeletal:     Comments: Right shoulder with slight bursa tenderness No swelling Active abduction only 45 degrees but passive ~90 Some limitation of external > internal  rotation but still better than before the cortisone injection             Assessment & Plan:

## 2023-03-09 NOTE — Assessment & Plan Note (Signed)
 3 days of increased symptoms Not as bad as before the steroid injection Discussed tylenol and topical diclofenac If ongoing issues, will refer to Dr Dion Saucier

## 2023-03-20 ENCOUNTER — Telehealth: Payer: Self-pay

## 2023-03-20 NOTE — Telephone Encounter (Signed)
 Copied from CRM [id number]. Topic: Clinical - Medical Advice >> Mar 20, 2023 12:04 PM Turkey A wrote: Reason for CRM: Patient called regarding empagliflozin (JARDIANCE) 10 MG TABS tablet. Patient said that he is not taking that medication and would like for someone to call him please

## 2023-03-20 NOTE — Telephone Encounter (Signed)
 Spoke to pt. He said he has not taken it in at least a month stating it is too expensive. I asked if he would like to speak to our pharmacist to see about the patient assistance program. He agreed that was a good idea.

## 2023-03-22 ENCOUNTER — Telehealth: Payer: Self-pay

## 2023-03-22 ENCOUNTER — Other Ambulatory Visit (INDEPENDENT_AMBULATORY_CARE_PROVIDER_SITE_OTHER): Payer: Self-pay | Admitting: Pharmacist

## 2023-03-22 DIAGNOSIS — E1149 Type 2 diabetes mellitus with other diabetic neurological complication: Secondary | ICD-10-CM

## 2023-03-22 NOTE — Progress Notes (Signed)
 Patient Assistance Program (PAP) Application   Manufacturer: [employer] ([employer])    (New enrollment) Medication(s): Jardiance  Patient Portion of Application:  03/22/23: Filled out by PharmD and Mailed to patient's home from clinic per patient request.  Income Documentation: Patient preference to mail a copy of income documentation to clinic. Income for himself and his wife = SS Letters  Provider Portion of Application:  Will complete once signed patient application/income is received back Prescription(s): Included in MAP application.   Application Status: Not submitted (pending signatures)  Next Steps: [x]    Application filled out and mailed to home address per pt request (Mailed 03/22/23) []    Patient to sign and mail back to clinic with copy of SSI letter for him and his wife []    Upon receipt of patient pages, place in PCP folder for PCP review/signature  []    After PCP signature, Application to be faxed to [facility] Fax: [phone] with copy of patient's insurance card AND scanned into patient chart  Forwarded to [facility] Patient Advocate Team for future correspondences/re-enrollment.  Note routed to PCP Clinic Pool to ensure PCP signature is obtained and application is faxed.  *[facility] clinic team - Please Addend/update this note as the "Next Steps" are completed in office*

## 2023-03-22 NOTE — Telephone Encounter (Signed)
-----   Message from Loree Fee sent at 03/22/2023 10:18 AM EDT ----- Pharmacy team FYI -- No action needed at this time, though please add patient to your [facility] MAP list for future tracking/correspondences.   PCP pool: No immediate action needed  [x]    Application filled out and mailed to home address per pt request (Mailed 03/22/23) [ ]    Patient to sign and mail back to clinic with copy of SSI letter for him and his wife [ ]    Upon receipt of patient pages, front office to place in PCP folder for PCP review/signature  [ ]    After PCP signature, Application to be faxed to [facility] Fax: [phone] with copy of patient's insurance card AND scanned into patient chart Please always reach out if any questions or unsure of next steps.  Lillia Abed

## 2023-05-17 ENCOUNTER — Other Ambulatory Visit: Payer: Self-pay | Admitting: Internal Medicine

## 2023-05-17 MED ORDER — OMEPRAZOLE 20 MG PO CPDR: 20.0000 mg | DELAYED_RELEASE_CAPSULE | Freq: Every day | ORAL | 3 refills | Status: DC

## 2023-05-17 MED ORDER — EMPAGLIFLOZIN 10 MG PO TABS: 10.0000 mg | ORAL_TABLET | Freq: Every day | ORAL | 3 refills | Status: DC

## 2023-05-17 MED ORDER — LEVOTHYROXINE SODIUM 75 MCG PO TABS: 75.0000 ug | ORAL_TABLET | Freq: Every day | ORAL | 3 refills | Status: DC

## 2023-05-17 MED ORDER — METFORMIN HCL 500 MG PO TABS: ORAL_TABLET | ORAL | 3 refills | Status: DC

## 2023-05-17 MED ORDER — GLIPIZIDE 10 MG PO TABS: ORAL_TABLET | ORAL | 3 refills | Status: DC

## 2023-05-17 MED ORDER — SIMVASTATIN 40 MG PO TABS: 40.0000 mg | ORAL_TABLET | Freq: Every day | ORAL | 3 refills | Status: DC

## 2023-05-17 MED ORDER — ASPIRIN EC 81 MG PO TBEC: 81.0000 mg | DELAYED_RELEASE_TABLET | Freq: Every day | ORAL | 3 refills | Status: DC

## 2023-05-17 NOTE — Telephone Encounter (Signed)
 Last Fill: ASA: Unk, Historical Provider     Jardiance : 12/28/22     Glipizide : 12/12/22      Levothyroxine : 12/12/22     Metformin : 04/13/22     Omeprazole : 12/12/22     Simvastatin : 12/12/22  Last OV: 11/17/22 Next OV: None Scheduled  Routing to provider for review/authorization.

## 2023-05-17 NOTE — Telephone Encounter (Signed)
 Copied from CRM [id number]. Topic: Clinical - Medication Refill >> May 17, 2023 10:58 AM Tanazia G wrote: Medication: all meds  Has the patient contacted their pharmacy? No (Agent: If no, request that the patient contact the pharmacy for the refill. If patient does not wish to contact the pharmacy document the reason why and proceed with request.) (Agent: If yes, when and what did the pharmacy advise?)  This is the patient's preferred pharmacy:  [facility] - [place], [place] - [address] [place] [address] Phone: [phone] Fax: [phone]   Is this the correct pharmacy for this prescription? Yes If no, delete pharmacy and type the correct one.   Has the prescription been filled recently? Yes  Is the patient out of the medication? Yes  Has the patient been seen for an appointment in the last year OR does the patient have an upcoming appointment? Yes  Can we respond through MyChart? Yes  Agent: Please be advised that Rx refills may take up to 3 business days. We ask that you follow-up with your pharmacy.

## 2023-05-17 NOTE — Telephone Encounter (Signed)
 Rx sent electronically.

## 2023-06-01 ENCOUNTER — Telehealth: Payer: Self-pay | Admitting: Internal Medicine

## 2023-06-01 MED ORDER — GLIPIZIDE 10 MG PO TABS: ORAL_TABLET | ORAL | 3 refills | Status: AC

## 2023-06-01 MED ORDER — OMEPRAZOLE 20 MG PO CPDR: 20.0000 mg | DELAYED_RELEASE_CAPSULE | Freq: Every day | ORAL | 3 refills | Status: AC

## 2023-06-01 MED ORDER — LEVOTHYROXINE SODIUM 75 MCG PO TABS: 75.0000 ug | ORAL_TABLET | Freq: Every day | ORAL | 3 refills | Status: AC

## 2023-06-01 MED ORDER — EMPAGLIFLOZIN 10 MG PO TABS
10.0000 mg | ORAL_TABLET | Freq: Every day | ORAL | 3 refills | Status: DC
Start: 2023-06-01 — End: 2023-09-12

## 2023-06-01 MED ORDER — SIMVASTATIN 40 MG PO TABS: 40.0000 mg | ORAL_TABLET | Freq: Every day | ORAL | 3 refills | Status: AC

## 2023-06-01 MED ORDER — CALCIUM CARB-CHOLECALCIFEROL 600-20 MG-MCG PO TABS: 1.0000 | ORAL_TABLET | Freq: Two times a day (BID) | ORAL | 3 refills | Status: DC

## 2023-06-01 MED ORDER — METFORMIN HCL 500 MG PO TABS: ORAL_TABLET | ORAL | 3 refills | Status: AC

## 2023-06-01 NOTE — Telephone Encounter (Signed)
Did the refills

## 2023-06-01 NOTE — Telephone Encounter (Signed)
 I called pt to find out which pharmacy he wanted the meds sent to because there were 2 pharmacies listed in the message. He said [facility] [address], which is not one of the 2 pharmacies listed in the message.

## 2023-06-01 NOTE — Telephone Encounter (Signed)
 Last Fill: Calcium Carb: Unk, Historical Provider        Copied from CRM [id number]. Topic: Clinical - Medication Refill >> Jun 01, 2023  1:09 PM Dorisann Garre T wrote: Medication: Calcium Carb-Cholecalciferol 600-800 MG-UNIT TABS [[id number]] glipiZIDE  (GLUCOTROL ) 10 MG tablet [[id number] omeprazole  (PRILOSEC) 20 MG capsule  metFORMIN  (GLUCOPHAGE ) 500 MG tablet [[id number]]   levothyroxine  (SYNTHROID ) 75 MCG tablet [[id number]]  empagliflozin  (JARDIANCE ) 10 MG TABS tablet [[id number]]  simvastatin  (ZOCOR ) 40 MG tablet [[id number]] Has the patient contacted their pharmacy? No (Agent: If no, request that the patient contact the pharmacy for the refill. If patient does not wish to contact the pharmacy document the reason why and proceed with request.) (Agent: If yes, when and what did the pharmacy advise?)  This is the patient's preferred pharmacy:  [facility] - [place], [place] - [address] [place] [address] Phone: [phone] Fax: [phone]  [facility] - [place], [place] - [address] [place] [address] Phone: [phone] Fax: [phone]   Is this the correct pharmacy for this prescription? Yes If no, delete pharmacy and type the correct one.   Has the prescription been filled recently? No  Is the patient out of the medication? Yes  Has the patient been seen for an appointment in the last year OR does the patient have an upcoming appointment? Yes  Can we respond through MyChart? No  Agent: Please be advised that Rx refills may take up to 3 business days. We ask that you follow-up with your pharmacy.

## 2023-06-01 NOTE — Addendum Note (Signed)
 Addended by: Franne Ivory on: 06/01/2023 04:30 PM   Modules accepted: Orders

## 2023-07-27 ENCOUNTER — Telehealth: Payer: Self-pay

## 2023-07-27 NOTE — Telephone Encounter (Signed)
 Spoke to pt. He does not remember receiving a form in the mail to renew his placards. I advised him Dr Jimmy is out of the office this week. I will get him an application filled out and signed and then mail it to him.

## 2023-07-27 NOTE — Telephone Encounter (Signed)
 Copied from CRM [id number]. Topic: General - Other >> Jul 27, 2023 12:57 PM Chiquita SQUIBB wrote: Reason for CRM: Patient is calling in regarding a renewal for his handicapped car place card. OK to leave a voicemail when it is ready.

## 2023-07-27 NOTE — Telephone Encounter (Signed)
 Handicap placard form printed and placed in Dr Marval inbox on his desk.

## 2023-07-29 NOTE — Telephone Encounter (Signed)
 Okay---I will complete and sign on Monday

## 2023-08-08 ENCOUNTER — Ambulatory Visit

## 2023-09-10 ENCOUNTER — Other Ambulatory Visit: Payer: Self-pay | Admitting: Internal Medicine

## 2023-09-12 NOTE — Telephone Encounter (Signed)
 Pt is past due for OV to F/U Diabetes. Please help him get established with Dr Bennett or NP in the office if that is what he wants to do. Thanks.

## 2023-09-13 NOTE — Telephone Encounter (Signed)
 I called patient and scheduled him on 09/22/23 Friday at 12:20 PM with Dr. Bennett as for a TOC/ follow up on diabetes appointment.

## 2023-09-21 DIAGNOSIS — E119 Type 2 diabetes mellitus without complications: Secondary | ICD-10-CM | POA: Diagnosis not present

## 2023-09-21 DIAGNOSIS — H524 Presbyopia: Secondary | ICD-10-CM | POA: Diagnosis not present

## 2023-09-21 DIAGNOSIS — Z961 Presence of intraocular lens: Secondary | ICD-10-CM | POA: Diagnosis not present

## 2023-09-21 LAB — HM DIABETES EYE EXAM

## 2023-09-22 ENCOUNTER — Encounter

## 2023-09-25 ENCOUNTER — Encounter

## 2023-09-26 ENCOUNTER — Encounter

## 2023-11-08 ENCOUNTER — Ambulatory Visit: Payer: Self-pay

## 2023-11-08 ENCOUNTER — Ambulatory Visit (INDEPENDENT_AMBULATORY_CARE_PROVIDER_SITE_OTHER)

## 2023-11-08 VITALS — BP 112/66 | HR 87 | Temp 97.9°F | Ht 67.0 in | Wt 154.0 lb

## 2023-11-08 DIAGNOSIS — E785 Hyperlipidemia, unspecified: Secondary | ICD-10-CM

## 2023-11-08 DIAGNOSIS — E1149 Type 2 diabetes mellitus with other diabetic neurological complication: Secondary | ICD-10-CM

## 2023-11-08 DIAGNOSIS — Z23 Encounter for immunization: Secondary | ICD-10-CM | POA: Diagnosis not present

## 2023-11-08 DIAGNOSIS — Z9189 Other specified personal risk factors, not elsewhere classified: Secondary | ICD-10-CM

## 2023-11-08 DIAGNOSIS — Z7984 Long term (current) use of oral hypoglycemic drugs: Secondary | ICD-10-CM

## 2023-11-08 DIAGNOSIS — N1831 Chronic kidney disease, stage 3a: Secondary | ICD-10-CM | POA: Diagnosis not present

## 2023-11-08 DIAGNOSIS — E039 Hypothyroidism, unspecified: Secondary | ICD-10-CM | POA: Diagnosis not present

## 2023-11-08 LAB — POCT GLYCOSYLATED HEMOGLOBIN (HGB A1C): Hemoglobin A1C: 7.4 % — AB (ref 4.0–5.6)

## 2023-11-08 MED ORDER — EMPAGLIFLOZIN 10 MG PO TABS: 20.0000 mg | ORAL_TABLET | Freq: Every day | ORAL | Status: AC

## 2023-11-08 NOTE — Patient Instructions (Addendum)
 Thank you for visiting [facility] today! Here's what we talked about:  - Stop taking aspirin  - Take thyroid  medication (Levothyroxine ) first thing in the morning, 1 hour before any food or other medication - Start taking two tablets of the Jardiance  for a total of 20mg   - Come back for two appointments: One with the lab where you have your dinner at 8PM the night before, do not eat anything after dinner, then have labs in the morning. You will have your appointment with the doctor a week after that

## 2023-11-08 NOTE — Progress Notes (Addendum)
 Subjective:   This visit was conducted in person. The patient gave informed consent to the use of Abridge AI technology to record the contents of the encounter as documented below.   Patient ID: Sean Koch, male    DOB: 02-02-1940, 83 y.o.   MRN: [id number]   Discussed the use of AI scribe software for clinical note transcription with the patient, who gave verbal consent to proceed.  History of Present Illness Sean Koch is an 83 year old male with diabetes, hypothyroidism, and chronic kidney disease who presents for a follow-up visit.  He has a history of diabetes, with his most recent A1c being 7.4, up from 6.6 a year ago. He avoids sugary drinks but consumes 'Diet [employer]'. He is currently taking Jardiance , glimepiride, and metformin  for diabetes management.  He has hypothyroidism and is taking levothyroxine . He has been taking it with other medications.  He has chronic kidney disease, which has been stable recently. He was unaware of this diagnosis until it was explained to him during the visit.  He has a history of high cholesterol and is taking simvastatin .  He has a past medical history of melanoma, which he does not recall clearly.  He was taking aspirin  but does not recall the reason for its use. No history of stroke, TIA, heart attack, or stent placement.  He has a history of vertigo, frozen shoulder, and hypertension.  He does not smoke or drink alcohol.      Review of Systems      Allergies  Allergen Reactions   Atorvastatin     REACTION: Joint aches    Current Outpatient Medications on File Prior to Visit  Medication Sig Dispense Refill   Blood Glucose Monitoring Suppl (ONE TOUCH ULTRA SYSTEM KIT) w/Device KIT Use to test blood sugar once daily DX: E11.41 1 each 0   glipiZIDE  (GLUCOTROL ) 10 MG tablet TAKE 1 TABLET TWICE DAILY BEFORE MEALS 180 tablet 3   levothyroxine  (SYNTHROID ) 75 MCG tablet Take 1 tablet (75 mcg total) by mouth  daily. 90 tablet 3   metFORMIN  (GLUCOPHAGE ) 500 MG tablet TAKE 2 TABLETS TWICE A DAY WITH MEALS 360 tablet 3   omeprazole  (PRILOSEC) 20 MG capsule Take 1 capsule (20 mg total) by mouth daily. 90 capsule 3   ONE TOUCH LANCETS MISC Use to test blood sugar 2-3 times weekly dx: E11.49 100 each 3   ONETOUCH ULTRA test strip TEST BLOOD SUGAR ONCE DAILY. 100 strip 4   simvastatin  (ZOCOR ) 40 MG tablet Take 1 tablet (40 mg total) by mouth at bedtime. 90 tablet 3   No current facility-administered medications on file prior to visit.    BP 112/66 (BP Location: Left Arm, Patient Position: Sitting, Cuff Size: Normal)   Pulse 87   Temp 97.9 F (36.6 C) (Oral)   Ht 5' 7 (1.702 m)   Wt 154 lb (69.9 kg)   SpO2 97%   BMI 24.12 kg/m   Objective:      Physical Exam GENERAL: Alert, cooperative, well developed, no acute distress. HEAD: Normocephalic atraumatic. EXTREMITIES: No cyanosis or edema. NEUROLOGICAL: Oriented to person, place and time, no gait abnormalities, moves all extremities without gross motor or sensory deficit.         Assessment & Plan:   Assessment & Plan Type 2 Diabetes Mellitus A1c increased from 6.6 to 7.4, just below target threshold of 7.5 for his age.  - Increase Jardiance  to 25 mg daily. - Continue glimepiride and metformin   at current doses. - Schedule follow-up appointment in a few weeks for lab work and review.  At risk for medication side effect Discussed known history of atherosclerosis, as well as increased bleeding risk given advanced age and the fact that this may outweigh benefit, especially given no CVD event. Through a process of shared decision making with the patient, he opts to dc aspirin  to mitigate bleeding risk but continue CVD risk factor control with statin.  - Discontinue aspirin . - Continue current dose of simvastatin  - Provide printed instructions for medication changes and follow-up.  Chronic Kidney Disease, Stage 3a Kidney function  well-managed with no significant decline in recent labs. - Order BMP and CBC to monitor kidney function.  Hypothyroidism Levothyroxine  absorption may be affected by concurrent medications. Proper administration instructed.  - Instruct to take levothyroxine  first thing in the morning, one hour before any food or other medications. - Recheck TSH in 8 weeks after proper administration.  Hyperlipidemia Managed with simvastatin . Current cholesterol levels need assessment. - Order fasting lipid panel. - Continue simvastatin  at current dose.  Need for influenza vaccine Administered high-dose influenza vaccine and tetanus vaccine.  - Administer high-dose influenza vaccine. - Administer tetanus vaccine.   Return in about 4 weeks (around 12/06/2023) for CPE, schedule fasting labs 1 wk prior.   [name] [name], MD  11/08/23     Contains text generated by Abridge.

## 2023-11-13 ENCOUNTER — Encounter

## 2023-12-20 NOTE — Progress Notes (Signed)
 Sean Koch                                          MRN: [id number]   12/20/2023   The [facility] Quality Team Specialist reviewed this patient medical record for the purposes of chart review for care gap closure. The following were reviewed: chart review for care gap closure-kidney health evaluation for diabetes:eGFR  and uACR.    [facility] Quality Team

## 2023-12-21 ENCOUNTER — Other Ambulatory Visit (INDEPENDENT_AMBULATORY_CARE_PROVIDER_SITE_OTHER)

## 2023-12-21 DIAGNOSIS — E785 Hyperlipidemia, unspecified: Secondary | ICD-10-CM | POA: Diagnosis not present

## 2023-12-21 DIAGNOSIS — N1831 Chronic kidney disease, stage 3a: Secondary | ICD-10-CM | POA: Diagnosis not present

## 2023-12-21 LAB — CBC WITH DIFFERENTIAL/PLATELET
Basophils Absolute: 0.1 K/uL (ref 0.0–0.1)
Basophils Relative: 1 % (ref 0.0–3.0)
Eosinophils Absolute: 0.4 K/uL (ref 0.0–0.7)
Eosinophils Relative: 5.5 % — ABNORMAL HIGH (ref 0.0–5.0)
HCT: 46.4 % (ref 39.0–52.0)
Hemoglobin: 15.4 g/dL (ref 13.0–17.0)
Lymphocytes Relative: 19.3 % (ref 12.0–46.0)
Lymphs Abs: 1.4 K/uL (ref 0.7–4.0)
MCHC: 33.2 g/dL (ref 30.0–36.0)
MCV: 92.6 fl (ref 78.0–100.0)
Monocytes Absolute: 0.4 K/uL (ref 0.1–1.0)
Monocytes Relative: 6.1 % (ref 3.0–12.0)
Neutro Abs: 4.8 K/uL (ref 1.4–7.7)
Neutrophils Relative %: 68.1 % (ref 43.0–77.0)
Platelets: 213 K/uL (ref 150.0–400.0)
RBC: 5.01 Mil/uL (ref 4.22–5.81)
RDW: 14.1 % (ref 11.5–15.5)
WBC: 7 K/uL (ref 4.0–10.5)

## 2023-12-21 LAB — LIPID PANEL
Cholesterol: 220 mg/dL — ABNORMAL HIGH (ref 0–200)
HDL: 42.2 mg/dL (ref >39.00)
LDL Cholesterol: 156 mg/dL — ABNORMAL HIGH (ref 0–99)
NonHDL: 177.34
Total CHOL/HDL Ratio: 5
Triglycerides: 107 mg/dL (ref 0.0–149.0)
VLDL: 21.4 mg/dL (ref 0.0–40.0)

## 2023-12-21 LAB — BASIC METABOLIC PANEL WITH GFR
BUN: 16 mg/dL (ref 6–23)
CO2: 31 meq/L (ref 19–32)
Calcium: 10 mg/dL (ref 8.4–10.5)
Chloride: 105 meq/L (ref 96–112)
Creatinine, Ser: 1.3 mg/dL (ref 0.40–1.50)
GFR: 50.03 mL/min — ABNORMAL LOW (ref >60.00)
Glucose, Bld: 131 mg/dL — ABNORMAL HIGH (ref 70–99)
Potassium: 4.1 meq/L (ref 3.5–5.1)
Sodium: 142 meq/L (ref 135–145)

## 2023-12-26 ENCOUNTER — Ambulatory Visit

## 2023-12-26 VITALS — Ht 67.0 in | Wt 154.0 lb

## 2023-12-26 DIAGNOSIS — Z0000 Encounter for general adult medical examination without abnormal findings: Secondary | ICD-10-CM

## 2023-12-26 NOTE — Patient Instructions (Signed)
 [name],  Thank you for taking the time for your Medicare Wellness Visit. I appreciate your continued commitment to your health goals. Please review the care plan we discussed, and feel free to reach out if I can assist you further.  Please note that Annual Wellness Visits do not include a physical exam. Some assessments may be limited, especially if the visit was conducted virtually. If needed, we may recommend an in-person follow-up with your provider.  Ongoing Care Seeing your primary care provider every 3 to 6 months helps us  monitor your health and provide consistent, personalized care.   Referrals If a referral was made during today's visit and you haven't received any updates within two weeks, please contact the referred provider directly to check on the status.  Recommended Screenings:  Health Maintenance  Topic Date Due   Yearly kidney health urinalysis for diabetes  09/10/2010   Complete foot exam   02/18/2023   Medicare Annual Wellness Visit  02/18/2023   DTaP/Tdap/Td vaccine (3 - Tdap) 11/07/2024*   COVID-19 Vaccine (5 - 2025-26 season) 11/07/2024*   Zoster (Shingles) Vaccine (1 of 2) 11/07/2024*   Hemoglobin A1C  05/08/2024   Eye exam for diabetics  09/20/2024   Yearly kidney function blood test for diabetes  12/20/2024   Pneumococcal Vaccine for age over 1  Completed   Flu Shot  Completed   Meningitis B Vaccine  Aged Out  *Topic was postponed. The date shown is not the original due date.       12/26/2023   12:36 PM  Advanced Directives  Does Patient Have a Medical Advance Directive? Yes  Type of Estate Agent of [place];Living will  Does patient want to make changes to medical advance directive? Yes (Inpatient - patient requests chaplain consult to change a medical advance directive)  Copy of Healthcare Power of Attorney in Chart? No - copy requested    Vision: Annual vision screenings are recommended for early detection of  glaucoma, cataracts, and diabetic retinopathy. These exams can also reveal signs of chronic conditions such as diabetes and high blood pressure.  Dental: Annual dental screenings help detect early signs of oral cancer, gum disease, and other conditions linked to overall health, including heart disease and diabetes.

## 2023-12-26 NOTE — Progress Notes (Signed)
 Chief Complaint  Patient presents with   Medicare Wellness     Subjective:   Sean Koch is a 83 y.o. male who presents for a Medicare Annual Wellness Visit.  Visit info / Clinical Intake: Medicare Wellness Visit Type:: Subsequent Annual Wellness Visit Persons participating in visit and providing information:: patient Medicare Wellness Visit Mode:: Telephone If telephone:: video declined Since this visit was completed virtually, some vitals may be partially provided or unavailable. Missing vitals are due to the limitations of the virtual format.: Documented vitals are patient reported If Telephone or Video please confirm:: I connected with patient using audio/video enable telemedicine. I verified patient identity with two identifiers, discussed telehealth limitations, and patient agreed to proceed. Patient Location:: Home Provider Location:: Office Interpreter Needed?: No Pre-visit prep was completed: yes AWV questionnaire completed by patient prior to visit?: no Living arrangements:: lives with spouse/significant other Patient's Overall Health Status Rating: good Typical amount of pain: none Does pain affect daily life?: no Are you currently prescribed opioids?: no  Dietary Habits and Nutritional Risks How many meals a day?: 3 Eats fruit and vegetables daily?: yes Most meals are obtained by: preparing own meals; eating out; having others provide food In the last 2 weeks, have you had any of the following?: none Diabetic:: (!) yes Any non-healing wounds?: no How often do you check your BS?: as needed Would you like to be referred to a Nutritionist or for Diabetic Management? : no  Functional Status Activities of Daily Living (to include ambulation/medication): Independent Ambulation: Independent with device- listed below Home Assistive Devices/Equipment: Eyeglasses; Cane Medication Administration: Independent Home Management (perform basic housework or laundry):  Independent Manage your own finances?: yes Primary transportation is: driving Concerns about vision?: no *vision screening is required for WTM* Concerns about hearing?: no  Fall Screening Falls in the past year?: 0 Number of falls in past year: 0 Was there an injury with Fall?: 0 Fall Risk Category Calculator: 0 Patient Fall Risk Level: Low Fall Risk  Fall Risk Patient at Risk for Falls Due to: No Fall Risks Fall risk Follow up: Falls evaluation completed; Falls prevention discussed  Home and Transportation Safety: All rugs have non-skid backing?: N/A, no rugs All stairs or steps have railings?: yes Grab bars in the bathtub or shower?: yes Have non-skid surface in bathtub or shower?: yes Good home lighting?: yes Regular seat belt use?: yes Hospital stays in the last year:: no  Cognitive Assessment Difficulty concentrating, remembering, or making decisions? : no Will 6CIT or Mini Cog be Completed: yes (dx-mild cognitive impairment) What year is it?: 4 points (said > 1995) What month is it?: 0 points Give patient an address phrase to remember (5 components): [address] [place] About what time is it?: 0 points Count backwards from 20 to 1: 0 points Say the months of the year in reverse: 4 points (unable to say months in reverse) Repeat the address phrase from earlier: 2 points ([place]) 6 CIT Score: 10 points  Advance Directives (For Healthcare) Does Patient Have a Medical Advance Directive?: Yes Does patient want to make changes to medical advance directive?: Yes (Inpatient - patient requests chaplain consult to change a medical advance directive) Type of Advance Directive: Healthcare Power of [place]; Living will Copy of Healthcare Power of Attorney in Chart?: No - copy requested Copy of Living Will in Chart?: No - copy requested  Reviewed/Updated  Reviewed/Updated: Reviewed All (Medical, Surgical, Family, Medications, Allergies, Care Teams, Patient  Goals)  Allergies (verified) Atorvastatin   Current Medications (verified) Outpatient Encounter Medications as of 12/26/2023  Medication Sig   Blood Glucose Monitoring Suppl (ONE TOUCH ULTRA SYSTEM KIT) w/Device KIT Use to test blood sugar once daily DX: E11.41   empagliflozin  (JARDIANCE ) 10 MG TABS tablet Take 2 tablets (20 mg total) by mouth daily before breakfast.   glipiZIDE  (GLUCOTROL ) 10 MG tablet TAKE 1 TABLET TWICE DAILY BEFORE MEALS   levothyroxine  (SYNTHROID ) 75 MCG tablet Take 1 tablet (75 mcg total) by mouth daily.   metFORMIN  (GLUCOPHAGE ) 500 MG tablet TAKE 2 TABLETS TWICE A DAY WITH MEALS   omeprazole  (PRILOSEC) 20 MG capsule Take 1 capsule (20 mg total) by mouth daily.   ONE TOUCH LANCETS MISC Use to test blood sugar 2-3 times weekly dx: E11.49   ONETOUCH ULTRA test strip TEST BLOOD SUGAR ONCE DAILY.   simvastatin  (ZOCOR ) 40 MG tablet Take 1 tablet (40 mg total) by mouth at bedtime.   No facility-administered encounter medications on file as of 12/26/2023.    History: Past Medical History:  Diagnosis Date   Carpal tunnel syndrome of right wrist    Cataract    ED (erectile dysfunction)    GERD (gastroesophageal reflux disease)    History of nephrolithiasis    Hyperlipidemia    Melanoma (HCC)    NIDDM, uncontrolled, with neuropathy    Personal history of colonic polyps - 2 small adenomas 07/15/2013   Renal disorder    kidney stones   Seronegative rheumatoid arthritis (HCC)    Thyroid  disease    Past Surgical History:  Procedure Laterality Date   CATARACT EXTRACTION W/ INTRAOCULAR LENS IMPLANT Bilateral 11/14 & 2/15   CHOLECYSTECTOMY     KNEE ARTHROSCOPY     MELANOMA EXCISION     upper right arm   PILONIDAL CYST / SINUS EXCISION     ROTATOR CUFF REPAIR     TOE SURGERY     Family History  Problem Relation Age of Onset   Heart disease Mother    Diabetes Mother    Heart disease Brother    Cancer Neg Hx    Colon cancer Neg Hx    Social History    Occupational History   Occupation: retired landscape architect  Tobacco Use   Smoking status: Never    Passive exposure: Past   Smokeless tobacco: Never  Substance and Sexual Activity   Alcohol use: Not Currently    Comment: 1 glass wine monthly per pt.   Drug use: No   Sexual activity: Not Currently   Tobacco Counseling Counseling given: No  SDOH Screenings   Food Insecurity: No Food Insecurity (12/26/2023)  Housing: Unknown (12/26/2023)  Transportation Needs: No Transportation Needs (12/26/2023)  Utilities: Not At Risk (12/26/2023)  Depression (PHQ2-9): Low Risk (12/26/2023)  Physical Activity: Insufficiently Active (12/26/2023)  Social Connections: Moderately Isolated (12/26/2023)  Stress: No Stress Concern Present (12/26/2023)  Tobacco Use: Low Risk (12/26/2023)  Health Literacy: Adequate Health Literacy (12/26/2023)   See flowsheets for full screening details  Depression Screen PHQ 2 & 9 Depression Scale- Over the past 2 weeks, how often have you been bothered by any of the following problems? Little interest or pleasure in doing things: 0 Feeling down, depressed, or hopeless (PHQ Adolescent also includes...irritable): 0 PHQ-2 Total Score: 0 Trouble falling or staying asleep, or sleeping too much: 0 Feeling tired or having little energy: 0 Poor appetite or overeating (PHQ Adolescent also includes...weight loss): 0 Feeling bad about yourself - or that you are  a failure or have let yourself or your family down: 0 Trouble concentrating on things, such as reading the newspaper or watching television (PHQ Adolescent also includes...like school work): 3 Moving or speaking so slowly that other people could have noticed. Or the opposite - being so fidgety or restless that you have been moving around a lot more than usual: 1 Thoughts that you would be better off dead, or of hurting yourself in some way: 0 PHQ-9 Total Score: 4 If you checked off any problems, how difficult have these  problems made it for you to do your work, take care of things at home, or get along with other people?: Somewhat difficult  Depression Treatment Depression Interventions/Treatment : Medication; Currently on Treatment; PHQ2-9 Score <4 Follow-up Not Indicated     Goals Addressed               This Visit's Progress     Patient Stated (pt-stated)        Patient stated             Objective:    Today's Vitals   12/26/23 1234  Weight: 154 lb (69.9 kg)  Height: 5' 7 (1.702 m)   Body mass index is 24.12 kg/m.  Hearing/Vision screen Hearing Screening - Comments:: Denies hearing difficulties   Vision Screening - Comments:: Wears rx glasses - up to date with routine eye exams  Immunizations and Health Maintenance Health Maintenance  Topic Date Due   Diabetic kidney evaluation - Urine ACR  09/10/2010   FOOT EXAM  02/18/2023   DTaP/Tdap/Td (3 - Tdap) 11/07/2024 (Originally 09/11/2023)   COVID-19 Vaccine (5 - 2025-26 season) 11/07/2024 (Originally 09/11/2023)   Zoster Vaccines- Shingrix (1 of 2) 11/07/2024 (Originally [birth year]1988)   HEMOGLOBIN A1C  05/08/2024   OPHTHALMOLOGY EXAM  09/20/2024   Diabetic kidney evaluation - eGFR measurement  12/20/2024   Medicare Annual Wellness (AWV)  12/25/2024   Pneumococcal Vaccine: 50+ Years  Completed   Influenza Vaccine  Completed   Meningococcal B Vaccine  Aged Out        Assessment/Plan:  This is a routine wellness examination for [facility].  Patient Care Team: Jimmy Charlie FERNS, MD as PCP - General  I have personally reviewed and noted the following in the patients chart:   Medical and social history Use of alcohol, tobacco or illicit drugs  Current medications and supplements including opioid prescriptions. Functional ability and status Nutritional status Physical activity Advanced directives List of other physicians Hospitalizations, surgeries, and ER visits in previous 12 months Vitals Screenings to include cognitive,  depression, and falls Referrals and appointments  No orders of the defined types were placed in this encounter.  In addition, I have reviewed and discussed with patient certain preventive protocols, quality metrics, and best practice recommendations. A written personalized care plan for preventive services as well as general preventive health recommendations were provided to patient.   Verdie CHRISTELLA Saba, CMA   12/26/2023   Return in 1 year (on 12/25/2024).  After Visit Summary: (MyChart) Due to this being a telephonic visit, the after visit summary with patients personalized plan was offered to patient via MyChart   Nurse Notes: CPE appt w/Dr Bennett on 12/28/2023.  6CIT Score = 10pts (dx-mild cognitive impairment)

## 2023-12-28 ENCOUNTER — Encounter

## 2024-12-31 ENCOUNTER — Encounter

## 2024-12-31 ENCOUNTER — Ambulatory Visit
